# Patient Record
Sex: Female | Born: 1965 | Race: White | Hispanic: Yes | Marital: Single | Smoking: Never smoker
Health system: Southern US, Community
[De-identification: ages and names within clinical notes are randomized; demographics above are authoritative.]

## PROBLEM LIST (undated history)

## (undated) DIAGNOSIS — E785 Hyperlipidemia, unspecified: Secondary | ICD-10-CM

## (undated) DIAGNOSIS — I10 Essential (primary) hypertension: Secondary | ICD-10-CM

## (undated) HISTORY — PX: CHOLECYSTECTOMY: SHX55

## (undated) HISTORY — DX: Essential (primary) hypertension: I10

## (undated) HISTORY — DX: Hyperlipidemia, unspecified: E78.5

## (undated) HISTORY — PX: ABDOMINAL HYSTERECTOMY: SHX81

---

## 2007-04-27 ENCOUNTER — Ambulatory Visit: Payer: Self-pay | Admitting: Gastroenterology

## 2007-05-03 ENCOUNTER — Ambulatory Visit (HOSPITAL_COMMUNITY): Admission: RE | Admit: 2007-05-03 | Discharge: 2007-05-03 | Payer: Self-pay | Admitting: Gastroenterology

## 2007-05-05 ENCOUNTER — Encounter: Payer: Self-pay | Admitting: Gastroenterology

## 2007-05-05 ENCOUNTER — Ambulatory Visit: Payer: Self-pay | Admitting: Gastroenterology

## 2007-06-18 ENCOUNTER — Encounter (INDEPENDENT_AMBULATORY_CARE_PROVIDER_SITE_OTHER): Payer: Self-pay | Admitting: General Surgery

## 2007-06-18 ENCOUNTER — Ambulatory Visit (HOSPITAL_COMMUNITY): Admission: RE | Admit: 2007-06-18 | Discharge: 2007-06-18 | Payer: Self-pay | Admitting: General Surgery

## 2009-07-04 ENCOUNTER — Encounter: Admission: RE | Admit: 2009-07-04 | Discharge: 2009-07-04 | Payer: Self-pay | Admitting: Gastroenterology

## 2010-09-24 NOTE — Procedures (Signed)
Summary: Gastroenterology EGD  Gastroenterology EGD   Imported By: Lowry Ram CMA 11/23/2007 16:50:21  _____________________________________________________________________  External Attachment:    Type:   Image     Comment:   External Document

## 2011-01-07 NOTE — Op Note (Signed)
NAME:  Nicole Villanueva, Nicole Villanueva      ACCOUNT NO.:  [id number]   MEDICAL RECORD NO.:  [id number]          PATIENT TYPE:  AMB   LOCATION:  DAY                          FACILITY:  [facility]   PHYSICIAN:  Anselm Pancoast. Weatherly, M.D.DATE OF BIRTH:  [birth year]   DATE OF PROCEDURE:  06/18/2007  DATE OF DISCHARGE:  06/18/2007                               OPERATIVE REPORT   PREOPERATIVE DIAGNOSIS:  Chronic cholecystitis with stones.   POSTOPERATIVE DIAGNOSIS:  Chronic cholecystitis with stones.   OPERATION:  Laparoscopic cholecystectomy with cholangiogram.   SURGEON:  Anselm Pancoast. Zachery Dakins, M.D.   ASSISTANT:  Currie Paris, M.D.   ANESTHESIA:  General anesthesia.   HISTORY:  Nicole Villanueva is a 45 year old female who was referred to [place]  at the courtesy of Dr. Hal Hope after she had seen Dr. Sheryn Bison  and was identified to have multiple gallstones.  She is on Prilosec,  Crestor and Lotensin, plus metformin for her diabetes, which she takes  at bedtime.  She had an ultrasound, being evaluated for epigastric pain,  and this showed multiple gallstones and she was referred to [place].  Dr.  Nadyne Coombes, is her medical doctor and she is 5 feet 2 inches and  weighs about 160 pounds.  She had preoperatively had normal liver  function studies and had an upper endoscopy with Dr. Jarold Motto that  showed a little bit of Barrett's esophagitis with no ulcer in her  duodenum.   DESCRIPTION OF PROCEDURE:  The patient preoperatively was given 3 g of  Unasyn.  She had PAS stockings and positioned on the OR bed.  Induction  was with general anesthesia, endotracheal tube and oral tube into the  stomach and then the abdomen was prepped with Betadine surgical solution  and draped in a sterile manner.  A small incision was made at the  umbilicus down through approximately 3 inches of adipose tissue.  The  fascia was identified and a little small vertical opening made, 2  Kochers and then the underlying  posterior rectus fascia was identified,  opened and then we were in the free peritoneal cavity.  A pursestring  suture was placed and the Hasson cannula introduced and then the  gallbladder was kind of intrahepatic.  The upper 10-mm trocar was placed  and the 2 lateral 5-mm trocars were placed by Dr. Jamey Ripa.  The proximal  portion of the gallbladder was dissected, opening up the peritoneum and  there was a little structure that at first we thought that it was kind  of thin for the cystic duct, but we could not be sure and could not see  any pulses; it obviously went up on the gallbladder wall and we placed a  clip distally and made a little small opening.  There was not any actual  blood and we thought that it was a very small bile duct, but it was so  small that the catheter would not go in it and after 4 or 5 attempts to  try to get in it at different angles, etc, we clipped it triply and  divided it.  We then continued dissecting and identified what was  definitely the cystic duct, which was certainly of normal size, and we  could see the common bile duct and then we clipped it flush with the  gallbladder and then a little opening proximally, put in the [place]  catheter and then did an x-ray.  The junction is pretty close to the  bifurcations of the left and right bile ducts with good flow and then  good flow into the duodenum.  We then we removed the catheter, triply  clipped the cystic duct out distally and no way we could be compromising  the bifurcation area and then very carefully dissected it out, since I  think that the first little vessel structure was probably a blood vessel  and we probably freed up the posterior artery and then doubly clipped  and then divided it.  She had numerous stones within her gallbladder; it  was a long, kind of banana-shaped gallbladder and good hemostasis  obtained in dissecting it from its liver bed.  I placed it into an  EndoCatch bag, switched the  camera to the upper 10-mm port and withdrew  the gallbladder and sac, after opening the sac and just removing the  numerous big stones that would come through the little fascial defect  otherwise.  We then reinspected the bed; there was good hemostasis.  We  looked again where the clips had been placed and could see the little  structure that we first thought was the cystic duct and it was not  pulsating, but now it looks more like it was probably the cystic artery.  We irrigated and aspirated the irrigating fluid that we had placed and  then closed the fascial defect in the umbilicus with a couple of figure-  of-eights of 0 Vicryl in addition to the pursestring and then  anesthetized it with Marcaine with adrenaline.  The 5-mm ports were  withdrawn.  The subcutaneous wounds were closed with 4-0 Vicryl and  Benzoin and Steri-Strips.  The patient will decide whether she wants to  be discharged today or spend the night and go home in the morning and  she will have Vicodin for pain.           ______________________________  Anselm Pancoast. Zachery Dakins, M.D.     WJW/MEDQ  D:  06/18/2007  T:  06/20/2007  Job:  [id number]   cc:   Clydie Braun L. Hal Hope, M.D.  Fax: [phone]   Vania Rea. Jarold Motto, MD, FACG, FACP, FAGA  [address]  [place]  [place] [address]

## 2011-01-07 NOTE — Assessment & Plan Note (Signed)
[facility]                         GASTROENTEROLOGY OFFICE NOTE   NAME:[name], [name]               MRN:          [id number]  DATE:04/27/2007                            DOB:          31-Jan-1966    [name] is a 45 year old [place] female referred by Dr. Hal Hope  for evaluation of dysphagia and subxiphoid chest pain.   Most of the history on [name] is provided by her daughter because  of some language barrier problems.  Over the last 7-8 months, she has  had worsening reflux symptoms, burning substernal chest pain,  regurgitation, and also some progressive solid food dysphagia.  She has  been only partially relieved with daily Nexium and was found to have a  positive H. pylori antibody by Dr. Hal Hope and was treated with a  prevpac with initial relief of symptoms, but these have returned.  Her  daughter says she has mild anorexia and has lost 7-8 pounds.  She has  never had endoscopy, barium studies, or ultrasound exams.  She gives no  history of hepatobiliary complaints, history of gallbladder or known  liver disease.  She has mild constipation but denies melena or  hematochezia.  She follows a fairly regular diet.  She denies the abuse  of cigarettes, alcohol, or NSAIDs.   Her past medical history is remarkable for adult-onset diabetes over the  last 6-7 years along with essential hypertension.  She has some mild,  what sounds like probable proteinuria associated with her diabetes.  She  has a normal creatinine of 0.9 mg% with a hemoglobin A1C of 9.3% and a  normal CBC with a hemoglobin of 14.3.   MEDICATIONS:  1. Metformin 500 mg twice daily.  2. Nexium 40 mg a day.   She denies drug allergies.   FAMILY HISTORY:  Remarkable for diabetes in multiple family members.  There are no known gastrointestinal problems.   SOCIAL HISTORY:  Patient is single and lives with her son.  She has a  high school education.  She works as a  Solicitor.  She does not  smoke or abuse ethanol.   REVIEW OF SYSTEMS:  Noncontributory.   PHYSICAL EXAMINATION:  She is 5 feet 1 inches tall and weighs 159  pounds.  Blood pressure 122/80.  Pulse was 76 and regular.  I could not appreciate stigmata of chronic liver disease.  CHEST:  Clear.  She was in a regular rhythm without murmurs, rubs or gallops.  There was no hepatosplenomegaly, abdominal masses, or tenderness.  Bowel  sounds were normal.  Her upper extremities were unremarkable.  MENTAL STATUS:  Clear.   ASSESSMENT:  I think 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  Sometimes after H. pylori is  treated, acid reflux can become worse, in terms of symptomatology,  because of increased acid production.   RECOMMENDATIONS:  1. Check endoscopy and ultrasound exam.  2. Continue Nexium q.a.m. and twice daily if needed.  3. Placed her on Carafate suspension 1 gm after meals and at bedtime      until workup can be completed.  4. Continue other diabetic medications and follow up with Dr. Hal Hope      as previously planned.     Nicole Rea. Jarold Motto, MD, Caleen Essex, FAGA  Electronically Signed    DRP/MedQ  DD: 04/27/2007  DT: 04/27/2007  Job #: [id number]   cc:   Clydie Braun L. Hal Hope, M.D.

## 2011-06-04 LAB — COMPREHENSIVE METABOLIC PANEL
ALT: 32
AST: 30
Albumin: 3.6
Alkaline Phosphatase: 74
BUN: 6
CO2: 31
Calcium: 9.4
Chloride: 102
Creatinine, Ser: 0.42
GFR calc Af Amer: >60
GFR calc non Af Amer: >60
Glucose, Bld: 96
Potassium: 4.2
Sodium: 142
Total Bilirubin: 0.9
Total Protein: 7.7

## 2011-06-04 LAB — URINALYSIS, ROUTINE W REFLEX MICROSCOPIC
Bilirubin Urine: NEGATIVE
Glucose, UA: NEGATIVE
Hgb urine dipstick: NEGATIVE
Ketones, ur: NEGATIVE
Nitrite: NEGATIVE
Protein, ur: NEGATIVE
Specific Gravity, Urine: 1.016
Urobilinogen, UA: 1
pH: 6.5

## 2011-06-04 LAB — CBC
HCT: 41.4
Hemoglobin: 14.1
MCHC: 34
MCV: 89.1
Platelets: 429 — ABNORMAL HIGH
RBC: 4.65
RDW: 12.7
WBC: 8.9

## 2011-06-04 LAB — DIFFERENTIAL
Basophils Absolute: 0
Basophils Relative: 0
Eosinophils Absolute: 0
Eosinophils Relative: 1
Lymphocytes Relative: 32
Lymphs Abs: 2.8
Monocytes Absolute: 0.4
Monocytes Relative: 5
Neutro Abs: 5.6
Neutrophils Relative %: 63

## 2011-06-04 LAB — PREGNANCY, URINE: Preg Test, Ur: NEGATIVE

## 2011-11-25 ENCOUNTER — Ambulatory Visit (INDEPENDENT_AMBULATORY_CARE_PROVIDER_SITE_OTHER): Payer: 59 | Admitting: Family Medicine

## 2011-11-25 VITALS — BP 150/83 | HR 97 | Temp 98.6°F | Resp 16 | Ht 60.0 in | Wt 150.0 lb

## 2011-11-25 DIAGNOSIS — J019 Acute sinusitis, unspecified: Secondary | ICD-10-CM

## 2011-11-25 DIAGNOSIS — J329 Chronic sinusitis, unspecified: Secondary | ICD-10-CM

## 2011-11-25 DIAGNOSIS — J40 Bronchitis, not specified as acute or chronic: Secondary | ICD-10-CM

## 2011-11-25 DIAGNOSIS — J209 Acute bronchitis, unspecified: Secondary | ICD-10-CM

## 2011-11-25 DIAGNOSIS — E119 Type 2 diabetes mellitus without complications: Secondary | ICD-10-CM

## 2011-11-25 MED ORDER — AMOXICILLIN-POT CLAVULANATE 875-125 MG PO TABS: 1.0000 | ORAL_TABLET | Freq: Two times a day (BID) | ORAL | Status: AC

## 2011-11-25 MED ORDER — METFORMIN HCL 1000 MG PO TABS: 1000.0000 mg | ORAL_TABLET | Freq: Two times a day (BID) | ORAL | Status: DC

## 2011-11-25 MED ORDER — HYDROCODONE-HOMATROPINE 5-1.5 MG/5ML PO SYRP: 5.0000 mL | ORAL_SOLUTION | Freq: Three times a day (TID) | ORAL | Status: AC | PRN

## 2011-11-25 MED ORDER — INSULIN GLARGINE 100 UNIT/ML ~~LOC~~ SOLN: 12.0000 [IU] | Freq: Every day | SUBCUTANEOUS | Status: DC

## 2011-11-25 MED ORDER — LISINOPRIL 10 MG PO TABS: 10.0000 mg | ORAL_TABLET | Freq: Every day | ORAL | Status: DC

## 2011-11-25 NOTE — Patient Instructions (Signed)
Sinusitis (Sinusitis) Los senos paranasales son bolsas de aire que se encuentran dentro de los huesos de la cara. La aparicin de bacterias en los senos paranasales lleva a una infeccin. Esta se denomina sinusitis.Estas infecciones generalmente son el resultado de una obstruccin en los orificios que [employer] senos.  SNTOMAS Generalmente, segn que seno se infecte, habr diferentes reas en las que Special educational needs teacher.   Los senos maxilares generalmente producen dolor detrs de los ojos.   La sinusitis frontal ocasiona dolor en el medio de la frente y [place] ojos.  Entre otros problemas (sntomas) se incluyen:  Dolor en la zona posterior de los dientes superiores.   Una secrecin similar al pus (purulenta) proveniente de la nariz.   Toda inflamacin, calor o sensibilidad en estas mismas reas son indicios de infeccin.  TRATAMIENTO La sinusitis se diagnostica a travs del examen fsico y radiografas. Si le han tomado radiografas, asegrese de [employer]. O consulte el modo en que podr obtenerlos. Su mdico le prescribir medicamentos (antibiticos). Estos medicamentos se indican para combatir la infeccin. Tambin le prescribir un descongestivo para reducir la inflamacin del seno paranasal.  INSTRUCCIONES PARA EL CUIDADO DOMICILIARIO  Utilice los medicamentos de venta libre o de prescripcin para Chief Technology Officer, el malestar o la [place], segn se lo indique el profesional que lo asiste.   Beba gran cantidad de lquidos. Los lquidos ayudan a que las mucosas de los senos nasales drenen ms fcilmente.   Aplique bolsas de calor hmedo o hielo en las zonas ms doloridas para [employer].   Utilice Sprays nasales salinos para ayudar a humedecer los senos nasales. Estos pueden encontrarse en la farmacia local.  SOLICITE ATENCIN MDICA DE INMEDIATO SI:  Lance Muss.   Dolor en aumento, dolor de cabeza intenso o dolor de dientes.   Nuseas, vmitos o  sudoracin.   Hinchazn infrecuente alrededor del rostro o problemas en la visin.  EST SEGURO QUE:   Comprende las instrucciones para el alta mdica.   Controlar su enfermedad.   Solicitar atencin mdica de inmediato segn las indicaciones.  Document Released: 05/21/2005 Document Revised: 07/31/2011 [facility] Patient Information 2012 [place], [place].Bronchitis Bronchitis is the body's way of reacting to injury and/or infection (inflammation) of the bronchi. Bronchi are the air tubes that extend from the windpipe into the lungs. If the inflammation becomes severe, it may cause shortness of breath. CAUSES  Inflammation may be caused by:  A virus.   Germs (bacteria).   Dust.   Allergens.   Pollutants and many other irritants.  The cells lining the bronchial tree are covered with tiny hairs (cilia). These constantly beat upward, away from the lungs, toward the mouth. This keeps the lungs free of pollutants. When these cells become too irritated and are unable to do their job, mucus begins to develop. This causes the characteristic cough of bronchitis. The cough clears the lungs when the cilia are unable to do their job. Without either of these protective mechanisms, the mucus would settle in the lungs. Then you would develop pneumonia. Smoking is a common cause of bronchitis and can contribute to pneumonia. Stopping this habit is the single most important thing you can do to help yourself. TREATMENT   Your caregiver may prescribe an antibiotic if the cough is caused by bacteria. Also, medicines that open up your airways make it easier to breathe. Your caregiver may also recommend or prescribe an expectorant. It will loosen the mucus to be coughed up. Only take  over-the-counter or prescription medicines for pain, discomfort, or fever as directed by your caregiver.   Removing whatever causes the problem (smoking, for example) is critical to preventing the problem from getting worse.    Cough suppressants may be prescribed for relief of cough symptoms.   Inhaled medicines may be prescribed to help with symptoms now and to help prevent problems from returning.   For those with recurrent (chronic) bronchitis, there may be a need for steroid medicines.  SEEK IMMEDIATE MEDICAL CARE IF:   During treatment, you develop more pus-like mucus (purulent sputum).   You have a fever.   Your baby is older than 3 months with a rectal temperature of 102 F (38.9 C) or higher.   Your baby is 45 months old or younger with a rectal temperature of 100.4 F (38 C) or higher.   You become progressively more ill.   You have increased difficulty breathing, wheezing, or shortness of breath.  It is necessary to seek immediate medical care if you are elderly or sick from any other disease. MAKE SURE YOU:   Understand these instructions.   Will watch your condition.   Will get help right away if you are not doing well or get worse.  Document Released: 08/11/2005 Document Revised: 07/31/2011 Document Reviewed: 06/20/2008 [facility] Patient Information 2012 [place], [place].

## 2011-11-25 NOTE — Progress Notes (Signed)
46 yo diabetic woman with 4 days of mildly productive cough associated with sinus congestion.  She has h/o pneumonia.  O:  NAD HEENT:  Mucopurulent d/c nose, o/w neg Chest:  ronchi Heart: reg, no murmur  A:  Sinusitis, bronchitis, acute with underlying diabetes  P:  augmentin bid and hydromet   Diabetes issues were reviewed with patient including compliance, diet, exercise. Also, all meds refilled

## 2012-03-10 ENCOUNTER — Ambulatory Visit: Payer: 59

## 2012-03-10 ENCOUNTER — Ambulatory Visit (INDEPENDENT_AMBULATORY_CARE_PROVIDER_SITE_OTHER): Payer: 59 | Admitting: Family Medicine

## 2012-03-10 VITALS — BP 98/60 | HR 67 | Temp 98.1°F | Resp 16 | Ht 59.5 in | Wt 147.0 lb

## 2012-03-10 DIAGNOSIS — N390 Urinary tract infection, site not specified: Secondary | ICD-10-CM

## 2012-03-10 DIAGNOSIS — M25529 Pain in unspecified elbow: Secondary | ICD-10-CM

## 2012-03-10 DIAGNOSIS — E119 Type 2 diabetes mellitus without complications: Secondary | ICD-10-CM

## 2012-03-10 DIAGNOSIS — I10 Essential (primary) hypertension: Secondary | ICD-10-CM

## 2012-03-10 DIAGNOSIS — Z0000 Encounter for general adult medical examination without abnormal findings: Secondary | ICD-10-CM

## 2012-03-10 DIAGNOSIS — N898 Other specified noninflammatory disorders of vagina: Secondary | ICD-10-CM

## 2012-03-10 LAB — TSH: TSH: 0.733 u[IU]/mL (ref 0.350–4.500)

## 2012-03-10 LAB — POCT URINALYSIS DIPSTICK
Bilirubin, UA: NEGATIVE
Blood, UA: NEGATIVE
Glucose, UA: NEGATIVE
Ketones, UA: NEGATIVE
Nitrite, UA: NEGATIVE
Protein, UA: NEGATIVE
Spec Grav, UA: 1.01
Urobilinogen, UA: 0.2
pH, UA: 6

## 2012-03-10 LAB — COMPREHENSIVE METABOLIC PANEL
ALT: 19 U/L (ref 0–35)
AST: 18 U/L (ref 0–37)
Albumin: 4.5 g/dL (ref 3.5–5.2)
Alkaline Phosphatase: 93 U/L (ref 39–117)
BUN: 9 mg/dL (ref 6–23)
CO2: 31 mEq/L (ref 19–32)
Calcium: 9.7 mg/dL (ref 8.4–10.5)
Chloride: 98 mEq/L (ref 96–112)
Creat: 0.48 mg/dL — ABNORMAL LOW (ref 0.50–1.10)
Glucose, Bld: 133 mg/dL — ABNORMAL HIGH (ref 70–99)
Potassium: 4.3 mEq/L (ref 3.5–5.3)
Sodium: 136 mEq/L (ref 135–145)
Total Bilirubin: 0.7 mg/dL (ref 0.3–1.2)
Total Protein: 8 g/dL (ref 6.0–8.3)

## 2012-03-10 LAB — LIPID PANEL
Cholesterol: 273 mg/dL — ABNORMAL HIGH (ref 0–200)
HDL: 42 mg/dL (ref >39)
LDL Cholesterol: 179 mg/dL — ABNORMAL HIGH (ref 0–99)
Total CHOL/HDL Ratio: 6.5 Ratio
Triglycerides: 261 mg/dL — ABNORMAL HIGH (ref <150)
VLDL: 52 mg/dL — ABNORMAL HIGH (ref 0–40)

## 2012-03-10 LAB — POCT CBC
Granulocyte percent: 53.5 %G (ref 37–80)
HCT, POC: 47.1 % (ref 37.7–47.9)
Hemoglobin: 14.7 g/dL (ref 12.2–16.2)
Lymph, poc: 5.8 — AB (ref 0.6–3.4)
MCH, POC: 28.9 pg (ref 27–31.2)
MCHC: 31.2 g/dL — AB (ref 31.8–35.4)
MCV: 92.7 fL (ref 80–97)
MID (cbc): 1 — AB (ref 0–0.9)
MPV: 8.7 fL (ref 0–99.8)
POC Granulocyte: 7.9 — AB (ref 2–6.9)
POC LYMPH PERCENT: 39.4 %L (ref 10–50)
POC MID %: 7.1 %M (ref 0–12)
Platelet Count, POC: 145 10*3/uL (ref 142–424)
RBC: 5.08 M/uL (ref 4.04–5.48)
RDW, POC: 13.3 %
WBC: 14.7 10*3/uL — AB (ref 4.6–10.2)

## 2012-03-10 LAB — POCT UA - MICROSCOPIC ONLY
Bacteria, U Microscopic: NEGATIVE
Casts, Ur, LPF, POC: NEGATIVE
Crystals, Ur, HPF, POC: NEGATIVE
Mucus, UA: NEGATIVE
RBC, urine, microscopic: NEGATIVE
Yeast, UA: NEGATIVE

## 2012-03-10 LAB — POCT GLYCOSYLATED HEMOGLOBIN (HGB A1C): Hemoglobin A1C: 8.6

## 2012-03-10 LAB — POCT WET PREP WITH KOH
Clue Cells Wet Prep HPF POC: NEGATIVE
KOH Prep POC: NEGATIVE
RBC Wet Prep HPF POC: NEGATIVE
Trichomonas, UA: NEGATIVE
WBC Wet Prep HPF POC: NEGATIVE
Yeast Wet Prep HPF POC: NEGATIVE

## 2012-03-10 MED ORDER — CIPROFLOXACIN HCL 250 MG PO TABS
250.0000 mg | ORAL_TABLET | Freq: Two times a day (BID) | ORAL | Status: AC
Start: 2012-03-10 — End: 2012-03-20

## 2012-03-10 MED ORDER — MELOXICAM 15 MG PO TABS: 15.0000 mg | ORAL_TABLET | Freq: Every day | ORAL | Status: AC

## 2012-03-10 NOTE — Progress Notes (Signed)
[facility]:  Office Visit  Chief Complaint:  Chief Complaint  Patient presents with  . Annual Exam    need fasting lab; pap smear    HPI: Nicole Villanueva is a 46 y.o. female who is here  for PE with pap Never had any abnormal paps, no prior mammogram; Would like to get pap even though normal paps in past Did not have period while on HCG for weight loss, has had period since then after ~ 3 years ammenorhea, restarted menses in March, LMP in April. + hot flashes.  Mother was premenopausal at 21. She also complains of Right elbow pain, described as sharp, intermittent, worse with palpation and movement. Started  after hitting it against object x 3 months. + swelling. She works as Production designer, theatre/television/film in [employer], has problems lifting occasionally  due to pain. Tries to avoid lifting as much as possible but that is hard to do all the time considering her job description.    Past Medical History  Diagnosis Date  . Hypertension   . Diabetes mellitus    Past Surgical History  Procedure Date  . Cholecystectomy    History   Social History  . Marital Status: Single    Spouse Name: N/A    Number of Children: N/A  . Years of Education: N/A   Social History Main Topics  . Smoking status: Never Smoker   . Smokeless tobacco: None  . Alcohol Use: No  . Drug Use: No  . Sexually Active: None   Other Topics Concern  . None   Social History Narrative  . None   Family History  Problem Relation Age of Onset  . Diabetes Mother   . Diabetes Father    No Known Allergies Prior to Admission medications   Medication Sig Start Date End Date Taking? Authorizing Provider  insulin glargine (LANTUS) 100 UNIT/ML injection Inject 12 Units into the skin at bedtime. 11/25/11  Yes Elvina Sidle, MD  lisinopril (PRINIVIL,ZESTRIL) 10 MG tablet Take 1 tablet (10 mg total) by mouth daily. 11/25/11  Yes Elvina Sidle, MD  metFORMIN (GLUCOPHAGE) 1000 MG tablet Take 1 tablet (1,000 mg  total) by mouth 2 (two) times daily with a meal. 11/25/11  Yes Elvina Sidle, MD     ROS: The patient denies fevers, chills, night sweats, unintentional weight loss, chest pain, palpitations, wheezing, dyspnea on exertion, nausea, vomiting, abdominal pain, dysuria, hematuria, melena, numbness, weakness, or tingling. + vaginal dc  All other systems have been reviewed and were otherwise negative with the exception of those mentioned in the HPI and as above.    PHYSICAL EXAM: Filed Vitals:   03/10/12 0923  BP: 98/60  Pulse: 67  Temp: 98.1 F (36.7 C)  Resp: 16   Filed Vitals:   03/10/12 0923  Height: 4' 11.5" (1.511 m)  Weight: 147 lb (66.679 kg)   Body mass index is 29.19 kg/(m^2).  General: Alert, no acute distress HEENT:  Normocephalic, atraumatic, oropharynx patent.  Cardiovascular:  Regular rate and rhythm, no rubs murmurs or gallops.  No Carotid bruits, radial pulse intact. No pedal edema.  Respiratory: Clear to auscultation bilaterally.  No wheezes, rales, or rhonchi.  No cyanosis, no use of accessory musculature GI: No organomegaly, abdomen is soft and non-tender, positive bowel sounds.  No masses. Skin: No rashes. Neurologic: Facial musculature symmetric. Psychiatric: Patient is appropriate throughout our interaction. Lymphatic: No cervical lymphadenopathy Musculoskeletal: Gait intact. GU: no masses, lesions, rashes;  vaginal vault nl;  no CMT, no purulent or white dc Breast: nl exam, no masses, skin changes, dc Right elbow:  + lateral elbow/epicondyle  tenderness, swelling ; ROM intact, senstation intact. Radial pulse intact.    LABS: Results for orders placed in visit on 03/10/12  POCT CBC      Component Value Range   WBC 14.7 (*) 4.6 - 10.2 K/uL   Lymph, poc 5.8 (*) 0.6 - 3.4   POC LYMPH PERCENT 39.4  10 - 50 %L   MID (cbc) 1.0 (*) 0 - 0.9   POC MID % 7.1  0 - 12 %M   POC Granulocyte 7.9 (*) 2 - 6.9   Granulocyte percent 53.5  37 - 80 %G   RBC 5.08  4.04 -  5.48 M/uL   Hemoglobin 14.7  12.2 - 16.2 g/dL   HCT, POC 40.9  81.1 - 47.9 %   MCV 92.7  80 - 97 fL   MCH, POC 28.9  27 - 31.2 pg   MCHC 31.2 (*) 31.8 - 35.4 g/dL   RDW, POC 91.4     Platelet Count, POC 145  142 - 424 K/uL   MPV 8.7  0 - 99.8 fL  POCT GLYCOSYLATED HEMOGLOBIN (HGB A1C)      Component Value Range   Hemoglobin A1C 8.6    POCT UA - MICROSCOPIC ONLY      Component Value Range   WBC, Ur, HPF, POC 0-1     RBC, urine, microscopic negative     Bacteria, U Microscopic negative     Mucus, UA negative     Epithelial cells, urine per micros 0-1     Crystals, Ur, HPF, POC negative     Casts, Ur, LPF, POC negative     Yeast, UA negative    POCT WET PREP WITH KOH      Component Value Range   Trichomonas, UA Negative     Clue Cells Wet Prep HPF POC negative     Epithelial Wet Prep HPF POC 0-4     Yeast Wet Prep HPF POC negative     Bacteria Wet Prep HPF POC trace     RBC Wet Prep HPF POC negative     WBC Wet Prep HPF POC negative     KOH Prep POC Negative    POCT URINALYSIS DIPSTICK      Component Value Range   Color, UA yellow     Clarity, UA clear     Glucose, UA negative     Bilirubin, UA negative     Ketones, UA negative     Spec Grav, UA 1.010     Blood, UA negative     pH, UA 6.0     Protein, UA negative     Urobilinogen, UA 0.2     Nitrite, UA negative     Leukocytes, UA Trace       EKG/XRAY:   Primary read interpreted by Dr. Conley Rolls at [facility]. Elbow: no acute fracture/dislocation   ASSESSMENT/PLAN: Encounter Diagnoses  Name Primary?  . Annual physical exam Yes  . HTN (hypertension)   . Diabetes mellitus   . Vaginal Discharge   . Elbow pain    Patient is here for CPE  With pap ( she insists on having a pap today even though we discussed that the new ACOG guidelines do not require yearly paps in someone her age with previous normal paps, however a pelvic exam is needed yearly. Since we are doing  pelvic patient would also like a pap smear)  She also has  other medical complaints on this visit. I spent a total of 45 minutes with her.   1. Labs penidng, d/w pt labs in house. Will rx Cipro 250 mg BID x 3 days for possible UTI. Urine cx pending. 2. Gave instructions on better diabetes control 3. [facility] 4. Refilled chronic medications 5. Mobic for lateral epicondylitis/elbow pain  F/u in 4 weeks for elbow pain if no improvement on Mobic F/u in 3 months for diabetes F/u in 1 year for annual    LE, THAO PHUONG, DO 03/10/2012 12:15 PM   =

## 2012-03-12 DIAGNOSIS — I10 Essential (primary) hypertension: Secondary | ICD-10-CM | POA: Insufficient documentation

## 2012-03-12 DIAGNOSIS — E1159 Type 2 diabetes mellitus with other circulatory complications: Secondary | ICD-10-CM | POA: Insufficient documentation

## 2012-03-12 DIAGNOSIS — I152 Hypertension secondary to endocrine disorders: Secondary | ICD-10-CM | POA: Insufficient documentation

## 2012-03-12 LAB — URINE CULTURE: Colony Count: 30000

## 2012-03-12 LAB — PAP IG AND CT-NG NAA
Chlamydia Probe Amp: NEGATIVE
GC Probe Amp: NEGATIVE

## 2012-03-14 ENCOUNTER — Other Ambulatory Visit: Payer: Self-pay | Admitting: Family Medicine

## 2012-03-14 ENCOUNTER — Encounter: Payer: Self-pay | Admitting: Family Medicine

## 2012-03-14 ENCOUNTER — Telehealth: Payer: Self-pay | Admitting: Family Medicine

## 2012-03-14 DIAGNOSIS — E785 Hyperlipidemia, unspecified: Secondary | ICD-10-CM

## 2012-03-14 MED ORDER — PRAVASTATIN SODIUM 40 MG PO TABS: 40.0000 mg | ORAL_TABLET | Freq: Every day | ORAL | Status: DC

## 2012-03-14 NOTE — Telephone Encounter (Signed)
Spoke to patient about lab results. Patient will take statin meds for elevated cholesterol.

## 2013-02-15 ENCOUNTER — Other Ambulatory Visit: Payer: Self-pay | Admitting: Family Medicine

## 2013-03-30 ENCOUNTER — Ambulatory Visit (INDEPENDENT_AMBULATORY_CARE_PROVIDER_SITE_OTHER): Payer: 59 | Admitting: Emergency Medicine

## 2013-03-30 ENCOUNTER — Ambulatory Visit: Payer: 59

## 2013-03-30 VITALS — BP 118/72 | HR 67 | Temp 97.7°F | Resp 18 | Ht 60.0 in | Wt 151.0 lb

## 2013-03-30 DIAGNOSIS — M79645 Pain in left finger(s): Secondary | ICD-10-CM

## 2013-03-30 DIAGNOSIS — E119 Type 2 diabetes mellitus without complications: Secondary | ICD-10-CM

## 2013-03-30 DIAGNOSIS — E785 Hyperlipidemia, unspecified: Secondary | ICD-10-CM

## 2013-03-30 DIAGNOSIS — M79609 Pain in unspecified limb: Secondary | ICD-10-CM

## 2013-03-30 DIAGNOSIS — Z0000 Encounter for general adult medical examination without abnormal findings: Secondary | ICD-10-CM

## 2013-03-30 DIAGNOSIS — R229 Localized swelling, mass and lump, unspecified: Secondary | ICD-10-CM

## 2013-03-30 DIAGNOSIS — R109 Unspecified abdominal pain: Secondary | ICD-10-CM

## 2013-03-30 DIAGNOSIS — R2232 Localized swelling, mass and lump, left upper limb: Secondary | ICD-10-CM

## 2013-03-30 LAB — LIPID PANEL
Cholesterol: 242 mg/dL — ABNORMAL HIGH (ref 0–200)
HDL: 38 mg/dL — ABNORMAL LOW (ref >39)
LDL Cholesterol: 135 mg/dL — ABNORMAL HIGH (ref 0–99)
Total CHOL/HDL Ratio: 6.4 Ratio
Triglycerides: 346 mg/dL — ABNORMAL HIGH (ref <150)
VLDL: 69 mg/dL — ABNORMAL HIGH (ref 0–40)

## 2013-03-30 LAB — COMPREHENSIVE METABOLIC PANEL
ALT: 43 U/L — ABNORMAL HIGH (ref 0–35)
AST: 35 U/L (ref 0–37)
Albumin: 4.2 g/dL (ref 3.5–5.2)
Alkaline Phosphatase: 83 U/L (ref 39–117)
BUN: 13 mg/dL (ref 6–23)
CO2: 27 mEq/L (ref 19–32)
Calcium: 9.5 mg/dL (ref 8.4–10.5)
Chloride: 101 mEq/L (ref 96–112)
Creat: 0.45 mg/dL — ABNORMAL LOW (ref 0.50–1.10)
Glucose, Bld: 204 mg/dL — ABNORMAL HIGH (ref 70–99)
Potassium: 4.1 mEq/L (ref 3.5–5.3)
Sodium: 135 mEq/L (ref 135–145)
Total Bilirubin: 0.6 mg/dL (ref 0.3–1.2)
Total Protein: 7.6 g/dL (ref 6.0–8.3)

## 2013-03-30 LAB — POCT CBC
Granulocyte percent: 55.3 %G (ref 37–80)
HCT, POC: 44 % (ref 37.7–47.9)
Hemoglobin: 14.1 g/dL (ref 12.2–16.2)
Lymph, poc: 3.2 (ref 0.6–3.4)
MCH, POC: 29.9 pg (ref 27–31.2)
MCHC: 32 g/dL (ref 31.8–35.4)
MCV: 93.5 fL (ref 80–97)
MID (cbc): 0.5 (ref 0–0.9)
MPV: 8.1 fL (ref 0–99.8)
POC Granulocyte: 4.5 (ref 2–6.9)
POC LYMPH PERCENT: 38.9 %L (ref 10–50)
POC MID %: 5.8 %M (ref 0–12)
Platelet Count, POC: 369 10*3/uL (ref 142–424)
RBC: 4.71 M/uL (ref 4.04–5.48)
RDW, POC: 13.2 %
WBC: 8.2 10*3/uL (ref 4.6–10.2)

## 2013-03-30 LAB — POCT URINALYSIS DIPSTICK
Bilirubin, UA: NEGATIVE
Blood, UA: NEGATIVE
Glucose, UA: 250
Ketones, UA: NEGATIVE
Leukocytes, UA: NEGATIVE
Nitrite, UA: NEGATIVE
Protein, UA: NEGATIVE
Spec Grav, UA: 1.025
Urobilinogen, UA: 0.2
pH, UA: 5.5

## 2013-03-30 LAB — TSH: TSH: 0.79 u[IU]/mL (ref 0.350–4.500)

## 2013-03-30 LAB — GLUCOSE, POCT (MANUAL RESULT ENTRY): POC Glucose: 188 mg/dl — AB (ref 70–99)

## 2013-03-30 LAB — POCT GLYCOSYLATED HEMOGLOBIN (HGB A1C): Hemoglobin A1C: 10.1

## 2013-03-30 MED ORDER — PRAVASTATIN SODIUM 40 MG PO TABS: 40.0000 mg | ORAL_TABLET | Freq: Every day | ORAL | Status: DC

## 2013-03-30 MED ORDER — LISINOPRIL 10 MG PO TABS: 10.0000 mg | ORAL_TABLET | Freq: Every day | ORAL | Status: DC

## 2013-03-30 MED ORDER — INSULIN GLARGINE 100 UNIT/ML ~~LOC~~ SOLN: 12.0000 [IU] | Freq: Every day | SUBCUTANEOUS | Status: DC

## 2013-03-30 MED ORDER — METFORMIN HCL 1000 MG PO TABS: 1000.0000 mg | ORAL_TABLET | Freq: Two times a day (BID) | ORAL | Status: DC

## 2013-03-30 NOTE — Progress Notes (Signed)
Subjective:    Patient ID: Nicole Villanueva, female    DOB: 06-03-1966, 47 y.o.   MRN: [id number]  HPI 47 year old female presents for CPE.  She is doing well. Does have a history of IDDM and hyperlipidemia. Normally sees Dr. Milus Glazier. Had a physical 1 year ago but has not had any follow up since then.  Was referred for a screening mammogram last year but did not go. Has never had a mammogram. Also was sent to GI for evaluation of abdominal pain - also did not go to that appointment but is wanting another referral for both. Has hx of constipation and intermittent abdominal pains as well as acid reflux.    Complains of a bump that she has noticed on her left index finger x 2 months. Admits she thinks she got a splinter in the area that she thinks did not come out.  Since then she has had a mass on the medial aspect of her finger that has grown in size and become bothersome. Will bleed on occasion if struck against something.    Had normal pap test last year. Denies vaginal discharge, abdominal pain, dysuria, or urinary frequency.   Medications unchanged - continues to use Lantus 12 units at bedtime, Metformin twice daily, pravachol and lisinopril daily. She does admit that she does not take her medications regularly and will miss occasional doses.     Review of Systems  Constitutional: Negative.   HENT: Negative.   Eyes: Negative.   Respiratory: Negative.   Cardiovascular: Negative.   Gastrointestinal: Negative.   Endocrine: Negative.   Genitourinary: Negative.   Skin: Positive for color change.  Allergic/Immunologic: Negative.   Neurological: Negative.   Hematological: Negative.   Psychiatric/Behavioral: Negative.        Objective:   Physical Exam  Constitutional: She is oriented to person, place, and time. She appears well-developed and well-nourished.  HENT:  Head: Normocephalic and atraumatic.  Right Ear: External ear normal.  Left Ear: External ear normal.   Mouth/Throat: Oropharynx is clear and moist.  Eyes: Conjunctivae and EOM are normal. Pupils are equal, round, and reactive to light.  Neck: Normal range of motion. Neck supple. No thyromegaly present.  Cardiovascular: Normal rate, regular rhythm and normal heart sounds.   Pulmonary/Chest: Effort normal and breath sounds normal.  Abdominal: Soft. Bowel sounds are normal. There is no rebound and no guarding.  Lymphadenopathy:    She has no cervical adenopathy.  Neurological: She is alert and oriented to person, place, and time.  Psychiatric: She has a normal mood and affect. Her behavior is normal. Judgment and thought content normal.   Results for orders placed in visit on 03/30/13  POCT CBC      Result Value Range   WBC 8.2  4.6 - 10.2 K/uL   Lymph, poc 3.2  0.6 - 3.4   POC LYMPH PERCENT 38.9  10 - 50 %L   MID (cbc) 0.5  0 - 0.9   POC MID % 5.8  0 - 12 %M   POC Granulocyte 4.5  2 - 6.9   Granulocyte percent 55.3  37 - 80 %G   RBC 4.71  4.04 - 5.48 M/uL   Hemoglobin 14.1  12.2 - 16.2 g/dL   HCT, POC 84.6  96.2 - 47.9 %   MCV 93.5  80 - 97 fL   MCH, POC 29.9  27 - 31.2 pg   MCHC 32.0  31.8 - 35.4 g/dL  RDW, POC 13.2     Platelet Count, POC 369  142 - 424 K/uL   MPV 8.1  0 - 99.8 fL  POCT URINALYSIS DIPSTICK      Result Value Range   Color, UA yellow     Clarity, UA cloudy     Glucose, UA 250     Bilirubin, UA neg     Ketones, UA neg     Spec Grav, UA 1.025     Blood, UA neg     pH, UA 5.5     Protein, UA neg     Urobilinogen, UA 0.2     Nitrite, UA neg     Leukocytes, UA Negative    GLUCOSE, POCT (MANUAL RESULT ENTRY)      Result Value Range   POC Glucose 188 (*) 70 - 99 mg/dl  POCT GLYCOSYLATED HEMOGLOBIN (HGB A1C)      Result Value Range   Hemoglobin A1C 10.1           [facility] reading (PRIMARY) by  Dr. Cleta Alberts as no acute bony abnormality or FB noted.  Soft tissue swelling of left side of index finger.    Assessment & Plan:  1. Routine general medical  examination at a health care facility - Plan: MM Digital Screening, POCT CBC, POCT urinalysis dipstick, Comprehensive metabolic panel, POCT glucose (manual entry), POCT glycosylated hemoglobin (Hb A1C), Lipid panel, TSH, CANCELED: POCT UA - Microscopic Only  -Labs pending.  2. Abdominal  pain, other specified site - Plan: Ambulatory referral to Gastroenterology  -Referral placed to GI for further evaluation and treatment 3. Finger pain, left - Plan: DG Finger Index Left 4. Diabetes mellitus - Plan: metFORMIN (GLUCOPHAGE) 1000 MG tablet, lisinopril (PRINIVIL,ZESTRIL) 10 MG tablet, insulin glargine (LANTUS) 100 UNIT/ML injection  -Refilled all chronic meds  -Follow up in 3 months with Dr. Milus Glazier for recheck of diabetes.  5. Other and unspecified hyperlipidemia  -Pending labs 6. Mass on other site of finger, left - Plan: Ambulatory referral to Orthopedic Surgery  -Referral placed to Orthopedics for further evaluation and removal. ?etiology - pyogenic granuloma vs skin cancer

## 2013-04-12 ENCOUNTER — Ambulatory Visit
Admission: RE | Admit: 2013-04-12 | Discharge: 2013-04-12 | Disposition: A | Discharge status: Home / Self Care | Payer: 59 | Source: Ambulatory Visit | Attending: Physician Assistant | Admitting: Physician Assistant

## 2013-04-12 DIAGNOSIS — Z0000 Encounter for general adult medical examination without abnormal findings: Secondary | ICD-10-CM

## 2013-04-29 ENCOUNTER — Telehealth: Payer: Self-pay | Admitting: Radiology

## 2013-04-29 NOTE — Telephone Encounter (Signed)
Patients insurance requires electronic referral form [place], through her insurance company for her surgery for her finger for a mass removal with [facility]. Call back [phone], ext [phone], direct # [id number] Darl Pikes. Please advise do you know how to do this, it is a [employer]IG[employer] product with her [facility]

## 2013-05-02 ENCOUNTER — Telehealth: Payer: Self-pay | Admitting: Radiology

## 2013-05-02 NOTE — Telephone Encounter (Signed)
Have spoken to [facility], unfortunately it does not appear we have been able to get authorization/ referral done through [facility]. Have advised Darl Pikes at [facility], there is no one working in referrals who can help her, Lupita Leash is out of the office. Patients surgery will have to be rescheduled.

## 2013-05-19 ENCOUNTER — Encounter: Payer: Self-pay | Admitting: Physician Assistant

## 2013-08-08 ENCOUNTER — Telehealth: Payer: Self-pay

## 2013-08-08 NOTE — Telephone Encounter (Signed)
Pt stopped by [phone] to ask about ortho referral. I checked with referrals and Tonna Corner said the day we tried to pre-cert the computer was down and we did a manual pre-cert.  Pt's ins is stating that a referral was never done. Tonna Corner is going to leave a message on Colleens's desk to ask what needs to be done.  Charlette Caffey

## 2013-12-21 ENCOUNTER — Ambulatory Visit (INDEPENDENT_AMBULATORY_CARE_PROVIDER_SITE_OTHER): Payer: 59 | Admitting: Family Medicine

## 2013-12-21 VITALS — BP 100/60 | HR 80 | Temp 97.9°F | Resp 16 | Ht 60.0 in | Wt 152.0 lb

## 2013-12-21 DIAGNOSIS — E1165 Type 2 diabetes mellitus with hyperglycemia: Secondary | ICD-10-CM

## 2013-12-21 DIAGNOSIS — R002 Palpitations: Secondary | ICD-10-CM

## 2013-12-21 DIAGNOSIS — IMO0001 Reserved for inherently not codable concepts without codable children: Secondary | ICD-10-CM

## 2013-12-21 DIAGNOSIS — R42 Dizziness and giddiness: Secondary | ICD-10-CM

## 2013-12-21 LAB — POCT CBC
Granulocyte percent: 79.6 %G (ref 37–80)
HCT, POC: 39 % (ref 37.7–47.9)
Hemoglobin: 12.6 g/dL (ref 12.2–16.2)
Lymph, poc: 1.9 (ref 0.6–3.4)
MCH, POC: 29.4 pg (ref 27–31.2)
MCHC: 32.3 g/dL (ref 31.8–35.4)
MCV: 90.9 fL (ref 80–97)
MID (cbc): 0.4 (ref 0–0.9)
MPV: 8.3 fL (ref 0–99.8)
POC Granulocyte: 9 — AB (ref 2–6.9)
POC LYMPH PERCENT: 17 %L (ref 10–50)
POC MID %: 3.4 %M (ref 0–12)
Platelet Count, POC: 375 10*3/uL (ref 142–424)
RBC: 4.29 M/uL (ref 4.04–5.48)
RDW, POC: 12.8 %
WBC: 11.3 10*3/uL — AB (ref 4.6–10.2)

## 2013-12-21 LAB — GLUCOSE, POCT (MANUAL RESULT ENTRY)

## 2013-12-21 MED ORDER — LANCET DEVICE MISC: Status: DC

## 2013-12-21 MED ORDER — GLUCOSE BLOOD VI STRP: ORAL_STRIP | Status: DC

## 2013-12-21 NOTE — Patient Instructions (Addendum)
Thank you for coming in today  Increase lantus to 17 units tonight Call tomorrow to schedule appointment with Dr. Conley RollsLe or Dr. Georgia LopesLaurenstein within 1 week Check blood sugar 3 times per day and write down in log book. Bring this with you to your next appointment Drink plenty of water. You will urinate more when your sugar is high  Your EKG is normal Labs pending Refer to cardiology for further evaluation to rule out heart as cause of symptoms Discontinue lisinopril for now Go to ER or Urgent care if symptoms return  Near-Syncope Near-syncope (commonly known as near fainting) is sudden weakness, dizziness, or feeling like you might pass out. During an episode of near-syncope, you may also develop pale skin, have tunnel vision, or feel sick to your stomach (nauseous). Near-syncope may occur when getting up after sitting or while standing for a long time. It is caused by a sudden decrease in blood flow to the brain. This decrease can result from various causes or triggers, most of which are not serious. However, because near-syncope can sometimes be a sign of something serious, a medical evaluation is required. The specific cause is often not determined. HOME CARE INSTRUCTIONS  Monitor your condition for any changes. The following actions may help to alleviate any discomfort you are experiencing:  Have someone stay with you until you feel stable.  Lie down right away if you start feeling like you might faint. Breathe deeply and steadily. Wait until all the symptoms have passed. Most of these episodes last only a few minutes. You may feel tired for several hours.   Drink enough fluids to keep your urine clear or pale yellow.   If you are taking blood pressure or heart medicine, get up slowly when seated or lying down. Take several minutes to sit and then stand. This can reduce dizziness.  Follow up with your health care provider as directed. SEEK IMMEDIATE MEDICAL CARE IF:   You have a severe  headache.   You have unusual pain in the chest, abdomen, or back.   You are bleeding from the mouth or rectum, or you have black or tarry stool.   You have an irregular or very fast heartbeat.   You have repeated fainting or have seizure-like jerking during an episode.   You faint when sitting or lying down.   You have confusion.   You have difficulty walking.   You have severe weakness.   You have vision problems.  MAKE SURE YOU:   Understand these instructions.  Will watch your condition.  Will get help right away if you are not doing well or get worse. Document Released: 08/11/2005 Document Revised: 04/13/2013 Document Reviewed: 01/14/2013 [facility] Patient Information 2014 [employer], [place].

## 2013-12-21 NOTE — Progress Notes (Signed)
   Subjective:    Patient ID: Nicole Villanueva, female    DOB: 1966/01/28, 48 y.o.   MRN: [id number]  HPI Patient presents with cough and cold. This afternoon took lisinopril at noon and got low BP at 4pm that made her weak and was feeling faint. Arms and legs felt very cold and felt like the veins in her arms were hot. Felt lightheaded and dizzy. Also had tightness in throat. Has happened several before but not as badly. At 4pm blood sugar was 237. BP at the time of her dizziness at home was 60/57 at home. No vomiting but some nausea and diarrhea today. Now feels much better and back to normal. No associated chest pain or shortness of breath but did . Episode lasted for 45 min - 1 hr. By the time that she was in the waiting room her symptoms were improving and then resolved. No history of heart problems. Occasionally does get some heart palpitations. Was feeling anxious at the time.   DM poorly controlled. Taking 12 units lantus at night. BG usually in the 200s. Does not check regularly  Review of Systems  All other systems reviewed and are negative.     Objective:   Physical Exam  Constitutional: She is oriented to person, place, and time. She appears well-developed and well-nourished.  HENT:  Head: Normocephalic and atraumatic.  Mouth/Throat: No oropharyngeal exudate.  Eyes: Conjunctivae are normal. Pupils are equal, round, and reactive to light. No scleral icterus.  Neck: Normal range of motion. Neck supple.  Cardiovascular: Normal rate, regular rhythm, normal heart sounds and intact distal pulses.   No murmur heard. Pulmonary/Chest: Effort normal and breath sounds normal. No respiratory distress. She has no wheezes.  Musculoskeletal: Normal range of motion.  Lymphadenopathy:    She has no cervical adenopathy.  Neurological: She is alert and oriented to person, place, and time.  Skin: Skin is warm and dry.  Psychiatric: She has a normal mood and affect. Her behavior is normal.       Assessment & Plan:  #1. Dizziness/Lightheadness/Presyncope associated with palpitations in diabetic middle age female - EKG: NSR - Labs: CBC, BG, CMP, TSH - Refer to cardiology - Return precautions - Hold lisinopril  #2. Hyperglycemia - Low concern for ketoacidosis as blood sugar 240 at 4pm - Increase lantus to 17 units qhs - Check BG tid and record - f/u within 1 week and bring log - Refill testing supplies

## 2013-12-22 LAB — COMPREHENSIVE METABOLIC PANEL
ALT: 30 U/L (ref 0–35)
AST: 25 U/L (ref 0–37)
Albumin: 4.2 g/dL (ref 3.5–5.2)
Alkaline Phosphatase: 87 U/L (ref 39–117)
BUN: 13 mg/dL (ref 6–23)
CO2: 26 mEq/L (ref 19–32)
Calcium: 9.9 mg/dL (ref 8.4–10.5)
Chloride: 96 mEq/L (ref 96–112)
Creat: 0.81 mg/dL (ref 0.50–1.10)
Glucose, Bld: 431 mg/dL — ABNORMAL HIGH (ref 70–99)
Potassium: 4.3 mEq/L (ref 3.5–5.3)
Sodium: 133 mEq/L — ABNORMAL LOW (ref 135–145)
Total Bilirubin: 0.4 mg/dL (ref 0.2–1.2)
Total Protein: 7.1 g/dL (ref 6.0–8.3)

## 2013-12-22 LAB — TSH: TSH: 0.808 u[IU]/mL (ref 0.350–4.500)

## 2013-12-22 NOTE — Progress Notes (Signed)
EKG read and patient discussed with Dr. Neomia DearVoss. Agree with assessment and plan of care per her note.

## 2013-12-31 ENCOUNTER — Other Ambulatory Visit: Payer: Self-pay | Admitting: Physician Assistant

## 2014-01-06 ENCOUNTER — Encounter: Payer: Self-pay | Admitting: Internal Medicine

## 2014-01-06 ENCOUNTER — Ambulatory Visit: Payer: 59 | Attending: Internal Medicine | Admitting: Internal Medicine

## 2014-01-06 VITALS — BP 103/68 | HR 75 | Temp 98.2°F | Resp 16 | Ht 62.0 in | Wt 151.6 lb

## 2014-01-06 DIAGNOSIS — E119 Type 2 diabetes mellitus without complications: Secondary | ICD-10-CM

## 2014-01-06 DIAGNOSIS — R232 Flushing: Secondary | ICD-10-CM

## 2014-01-06 DIAGNOSIS — Z7689 Persons encountering health services in other specified circumstances: Secondary | ICD-10-CM

## 2014-01-06 DIAGNOSIS — Z794 Long term (current) use of insulin: Secondary | ICD-10-CM | POA: Insufficient documentation

## 2014-01-06 DIAGNOSIS — E785 Hyperlipidemia, unspecified: Secondary | ICD-10-CM | POA: Insufficient documentation

## 2014-01-06 DIAGNOSIS — N951 Menopausal and female climacteric states: Secondary | ICD-10-CM

## 2014-01-06 DIAGNOSIS — Z79899 Other long term (current) drug therapy: Secondary | ICD-10-CM | POA: Insufficient documentation

## 2014-01-06 DIAGNOSIS — I10 Essential (primary) hypertension: Secondary | ICD-10-CM | POA: Insufficient documentation

## 2014-01-06 DIAGNOSIS — Z7189 Other specified counseling: Secondary | ICD-10-CM

## 2014-01-06 LAB — GLUCOSE, POCT (MANUAL RESULT ENTRY): POC Glucose: 168 mg/dl — AB (ref 70–99)

## 2014-01-06 LAB — POCT GLYCOSYLATED HEMOGLOBIN (HGB A1C): Hemoglobin A1C: 10.7

## 2014-01-06 MED ORDER — PAROXETINE MESYLATE 7.5 MG PO CAPS: 7.5000 mg | ORAL_CAPSULE | Freq: Every day | ORAL | Status: DC

## 2014-01-06 MED ORDER — INSULIN GLARGINE 100 UNIT/ML SOLOSTAR PEN: PEN_INJECTOR | SUBCUTANEOUS | Status: DC

## 2014-01-06 NOTE — Patient Instructions (Signed)
MiPlato, Departamento de Agricultura de Estados [place]idos Architect) (MyPlate, [employer]) La cantidad que debe comer de cada alimento depende de su edad, sexo y [place] fsica. Para encontrar las cantidades adecuadas para usted, visite [email or web address] Consejos:  Disfrute la comida, pero coma menos.  Evite las porciones [employer]. CEREALES   Por lo menos la mitad de los cereales que consume deben ser integrales.  Para un plan de alimentacin de 2000caloras por da, coma 6onzas (170gramos) diariamente.  Una onza es aproximadamente 1rodaja de pan, 1taza de cereal o mediataza de arroz, cereal o pasta cocidos. VEGETALES  La mitad del plato debe tener frutas y verduras.  Para un plan de alimentacin de 2000caloras por da, coma 2tazas y media diariamente.  Una taza es aproximadamente 1taza de verduras o de jugo de verduras crudas o cocidas, o 2tazas de verduras de hojas verdes crudas. FRUTAS  La mitad del plato debe tener frutas y verduras.  Para un plan de alimentacin de 2000caloras por da, coma 2tazas diariamente.  Una taza es aproximadamente 1taza de frutas o de jugo 100% de frutas, o media taza de frutas desecadas. LCTEOS  Cambie a la [employer] o con bajo contenido graso (1%).  Para un plan de alimentacin de 2000caloras por da, tome 3tazas diariamente.  Una taza es aproximadamente 1taza de [place], yogur o [place] de soja (bebidas de soja), 1onza y media (42gramos) de queso natural o 2onzas (57gramos) de queso procesado. PROTENAS  Para un plan de alimentacin de 2000caloras por da, coma 5onzas y media (156gramos) diariamente.  Una onza es aproximadamente 1onza (28gramos) de carne de res, ave o pescado, un cuarto de taza de frijoles cocidos, 1huevo, 1cucharada de [place] de man o media onza (14gramos) de frutos secos o semillas. ACEITES Y CALORAS VACAS  Solo se recomiendan pequeas cantidades de aceites.  Las  caloras vacas son aquellas que provienen de las grasas slidas o los azcares agregados.  Compare la cantidad de sodio de los alimentos tales como la sopa, el pan y las comidas [place], y elija aquellos que menos tienen.  Beba agua en lugar de bebidas azucaradas. Document Released: 06/01/2013 [facility] Patient Information 2014 [place], [place]. DASH Diet The DASH diet stands for "Dietary Approaches to Stop Hypertension." It is a healthy eating plan that has been shown to reduce high blood pressure (hypertension) in as little as 14 days, while also possibly providing other significant health benefits. These other health benefits include reducing the risk of breast cancer after menopause and reducing the risk of type 2 diabetes, heart disease, colon cancer, and stroke. Health benefits also include weight loss and slowing kidney failure in patients with chronic kidney disease.  DIET GUIDELINES  Limit salt (sodium). Your diet should contain less than 1500 mg of sodium daily.  Limit refined or processed carbohydrates. Your diet should include mostly whole grains. Desserts and added sugars should be used sparingly.  Include small amounts of heart-healthy fats. These types of fats include nuts, oils, and tub margarine. Limit saturated and trans fats. These fats have been shown to be harmful in the body. CHOOSING FOODS  The following food groups are based on a 2000 calorie diet. See your Registered Dietitian for individual calorie needs. Grains and Grain Products (6 to 8 servings daily)  Eat More Often: Whole-wheat bread, brown rice, whole-grain or wheat pasta, quinoa, popcorn without added fat or salt (air popped).  Eat Less Often: White bread, white pasta, white rice, cornbread. Vegetables (4 to 5 servings daily)  Eat More Often: Fresh, frozen, and canned vegetables. Vegetables may be raw, steamed, roasted, or grilled with a minimal amount of fat.  Eat Less Often/Avoid: Creamed or fried  vegetables. Vegetables in a cheese sauce. Fruit (4 to 5 servings daily)  Eat More Often: All fresh, canned (in natural juice), or frozen fruits. Dried fruits without added sugar. One hundred percent fruit juice ( cup [237 mL] daily).  Eat Less Often: Dried fruits with added sugar. Canned fruit in light or heavy syrup. Foot LockerLean Meats, Fish, and Poultry (2 servings or less daily. One serving is 3 to 4 oz [85-114 g]).  Eat More Often: Ninety percent or leaner ground beef, tenderloin, sirloin. Round cuts of beef, chicken breast, Malaw[place]urkey breast. All fish. Grill, bake, or broil your meat. Nothing should be fried.  Eat Less Often/Avoid: Fatty cuts of meat, Malaw[place]urkey, or chicken leg, thigh, or wing. Fried cuts of meat or fish. Dairy (2 to 3 servings)  Eat More Often: Low-fat or fat-free milk, low-fat plain or light yogurt, reduced-fat or part-skim cheese.  Eat Less Often/Avoid: Milk (whole, 2%).Whole milk yogurt. Full-fat cheeses. Nuts, Seeds, and Legumes (4 to 5 servings per week)  Eat More Often: All without added salt.  Eat Less Often/Avoid: Salted nuts and seeds, canned beans with added salt. Fats and Sweets (limited)  Eat More Often: Vegetable oils, tub margarines without trans fats, sugar-free gelatin. Mayonnaise and salad dressings.  Eat Less Often/Avoid: Coconut oils, palm oils, butter, stick margarine, cream, half and half, cookies, candy, pie. FOR MORE INFORMATION The Dash Diet Eating Plan: www.dashdiet.org Document Released: 07/31/2011 Document Revised: 11/03/2011 Document Reviewed: 07/31/2011 [facility] Patient Information 2014 [place]ExitCare, [place]. Diabetes y cuidados del pie (Diabetes and Foot Care) La diabetes puede ser la causa de que el flujo sanguneo (circulacin) en las piernas y los pies sea deficiente. Debido a esto, la piel de los pies se torna ms delgada, se rompe con facilidad y se cura ms lentamente. La piel puede estar seca, despellejarse y Lobbyistagrietarse. Tambin pueden estar  daados los nervios de las piernas y de los pies lo que provoca una disminucin de la sensibilidad. Es posible que no advierta heridas ms pequeas en los pies, que pueden causar infecciones graves. Cuidar sus pies es una de las cosas ms importantes que puede hacer por usted mismo.  INSTRUCCIONES PARA EL CUIDADO EN EL HOGAR  Use siempre calzado, an dentro de su casa. No camine descalzo. Caminar descalzo facilita que se lastime.  Controle sus pies diariamente para observar ampollas, cortes y enrojecimiento. Si no puede ver la planta del pie, use un espejo o pdale ayuda a Engineer, maintenance (IT)otra persona.  Lave sus pies con agua tibia (no use agua caliente) y un [place] Territoryjabn suave. Seque bien sus pies, y la zona The Krogerentre los dedos dando Candelaria Arenaspalmaditas, hasta que estn completamente secos. Noremoje los pies, ya que esto puede resecar la piel.  Aplique una locin hidratante o vaselina (que no contenga alcohol ni perfume) en los pies y en las uas secas y Panamaquebradizas. No aplique locin entre los dedos.  Recorte las uas en forma recta. No escarbe debajo de las uas o alrededor General Millsde las cutculas. Lime los bordes de las uas con una lima o esmeril.  No corte las durezas o callosidades, ni trate de quitarlas con medicamentos.  Use calcetines de algodn o medias El [place] Corporationlimpias todos los das. Asegrese de que no le PACCAR Incajusten demasiado. Nouse calcetines que le lleguen a las rodillas, ya que podran disminuir el flujo de Weldonsangre a las  piernas.  Use zapatos de cuero que le queden bien y que sean acolchados. Para amoldar los zapatos, clcelos slo algunas horas por da. Esto evitar lesiones en los pies. Revise siempre los zapatos antes de ponerlos para asegurarse de que no haya objetos en su interior.  No cruce las piernas. Esto puede disminuir el flujo de sangre a los pies.  Si algo le ha raspado, cortado o lastimado la piel de los pies, mantenga la piel de esa zona limpia y [place] Rid[place]eca. Debe higienizar estas zonas con agua y un jabn suave. No limpie la  zona con agua oxigenada, alcohol ni yodo.  Cuando se quite un vendaje adhesivo, asegrese de no daar la piel.  Si tiene una herida, obsrvela varias veces por da para asegurarse de que se est curando.  No use bolsas de agua caliente ni almohadillas trmicas. Podran causar quemaduras. Si ha perdido la sensibilidad en los pies o las piernas, no sabr lo que le est sucediendo hasta que sea demasiado tarde.  Asegrese de que su mdico le haga un examen completo de los pies por lo menos una vez al ao, o con ms frecuencia si usted tiene Caremark Rxproblemas en los pies. Informe todos los cortes, llagas o moretones a su mdico inmediatamente. SOLICITE ATENCIN MDICA SI:   Tiene una lesin que no se cura.  Tiene cortes o rajaduras en la piel.  Tiene una ua encarnada.  Nota una zona irritada en las piernas o los pies.  Siente una sensacin de ardor u hormigueo en las piernas o los pies.  Siente dolor o calambres en las piernas o los pies.  Las piernas o los pies estn adormecidos.  Siente los pies siempre fros. SOLICITE ATENCIN MDICA DE INMEDIATO SI:   Presenta enrojecimiento, hinchazn o aumento del dolor en una herida.  Nota una lnea roja que sube por pierna.  Aparece pus en la herida.  Le sube la fiebre o segn lo que le indique el mdico.  Advierte un olor ftido que proviene de una lcera o una herida. Document Released: 08/11/2005 Document Revised: 04/13/2013 [facility] Patient Information 2014 [place]ExitCare, [place]LLC. Diabetes y Doroteo Glassmanactividad fsica (Diabetes and Exercise) Hacer actividad fsica con regularidad es muy importante. No se trata slo de perder peso. Tiene muchos otros beneficios, como por ejemplo:  Mejorar el [place] Lakeestado de [place] Vernonagilidad, flexibilidad y resistencia.  Aumenta la densidad sea.  Ayuda a Art gallery managercontrolar el peso.  Disminuye la Art gallery managergrasa corporal.  Aumenta la fuerza muscular.  Reduce el estrs y las tensiones.  Mejora el estado de salud general. Las personas diabticas  que realizan actividad fsica tienen beneficios adicionales debido al ejercicio:  Reduce el apetito.  Mejora la utilizacin del azcar (glucosa) por parte del organismo.  Ayuda a disminuir o Information systems managercontrolar la glucosa en sangre.  Disminuye la presin arterial.  Ayuda a disminuir los lpidos en sangre (colesterol y triglicridos).  Mejora el uso de la insulina por parte del organismo porque:  Aumenta la sensibilidad del organismo a la insulina.  Reduce las necesidades de insulina del organismo.  Disminuye el riesgo de enfermedad cardaca por la actividad fsica.  Disminuye el colesterol y los triglicridos.  Aumenta los niveles de colesterol bueno (como las lipoprotenas de alta densidad [HDL]) en el organismo.  Disminuye los niveles de glucosa en Viviansangre. SU PLAN DE ACTIVIDAD  Elija una actividad que disfrute y establezca objetivos realistas. Su mdico o educador en diabetes podrn ayudarlo a Tax adviserrealizar una actividad que lo beneficie. Podr dividir las actividades en 2 o 3 sesiones  a lo Paediatric nurse. Hacer esto es tan bueno como hacer una sesin prolongada. Las ideas para los ejercicios incluyen:  Lleve a Multimedia programmer.  Utilice las Microbiologist del ascensor.  Baile su cancin favorita.  Haga sus ejercicios favoritos con Leisure centre manager. RECOMENDACIONES PARA REALIZAR EJERCICIOS CUANDO SE TIENE DIABETES TIPO 1 O TIPO 2   Controle la glucosa en sangre antes de comenzar. Si el nivel de glucosa en sangre es de ms de 240 mg/dl, controle las cetonas en la [place]. Si hay cetonas, no realice actividad fsica.  Evite inyectarse insulina en las zonas del cuerpo que ejercitar. Por ejemplo, evite inyectarse insulina en:  Los brazos, si juega al tenis.  Las piernas, si corre.  Lleve un registro de:  Alimentos que consume antes y despus de Tour manager.  Los momentos esperables de picos de accin de la insulina.  Los niveles de glucosa en sangre antes y despus de hacer  ejercicios.  El tipo y cantidad de [place].  Revise los registros con su mdico. El mdico lo ayudar a Environmental education officer pautas para ajustar la cantidad de alimento y las cantidades de insulina antes y despus de Radio producer ejercicios.  Si toma insulina o agentes hipoglucemiantes por va oral, observe si hay signos y sntomas de hipoglucemia. Ellos son:  Mareos.  Temblores.  Sudoracin.  Escalofros.  Confusin.  Beba gran cantidad de agua mientras hace ejercicios para evitar la deshidratacin o el infarto. Durante la actividad fsica se pierde Data processing manager.  Comente con su mdico antes de comenzar un programa de actividad fsica para verificar que sea seguro para usted. Recuerde, cualquier actividad es mejor que ninguna. Document Released: 08/31/2007 Document Revised: 04/13/2013 [facility] Patient Information 2014 [place], [place].

## 2014-01-06 NOTE — Progress Notes (Signed)
Patient is here to establish care for DM and hyperlipidemia States she almost passed out 3 weeks ago but was able to sit down prior to falling. Was told to stop lisinopril at that time. Referred to social worker for PHQ-9 score of 22. Provider aware.

## 2014-01-07 NOTE — Progress Notes (Signed)
Patient ID: Nicole BoyerMartha A Bravo Villanueva, female   DOB: [birth year], 48 y.o.   MRN: [id number]   Nicole SparMartha Bravo Villanueva, is a 48 y.o. female  [id number]  [id number]  DOB - [birth year]  CC:  Chief Complaint  Patient presents with  . Establish Care  . Diabetes       HPI: Nicole SparMartha Bravo Villanueva is a 48 y.o. female here today to establish medical care.  Patient reports that she has been diabetic for several years and has maintained on Metformin and nightly lantus without complications.  Patient reports that she is taking 17 units of insulin at this time.  Patient admits to missing frequent doses of lantus and metformin in the past.  She infrequently checks her blood sugar due to her busy schedule.  Patient states that she knows how to eat properly because she has receive diabetes education but she does not want to eat healthy.  Patient refused to try diabetes education and nutrition classes again.     No Known Allergies Past Medical History  Diagnosis Date  . Hypertension   . Diabetes mellitus   . Hyperlipidemia    Current Outpatient Prescriptions on File Prior to Visit  Medication Sig Dispense Refill  . glucose blood (FREESTYLE TEST STRIPS) test strip Use as instructed  100 each  12  . Lancet Device MISC Check blood sugar 3x per day  90 each  5  . metFORMIN (GLUCOPHAGE) 1000 MG tablet Take 1 tablet (1,000 mg total) by mouth 2 (two) times daily with a meal. Needs office visit and labs for further refills of all medications  60 tablet  3  . pravastatin (PRAVACHOL) 40 MG tablet Take 1 tablet (40 mg total) by mouth at bedtime.  30 tablet  3   No current facility-administered medications on file prior to visit.   Family History  Problem Relation Age of Onset  . Diabetes Mother   . Diabetes Father    History   Social History  . Marital Status: Single    Spouse Name: N/A    Number of Children: N/A  . Years of Education: N/A   Occupational History  . Not on file.   Social  History Main Topics  . Smoking status: Never Smoker   . Smokeless tobacco: Not on file  . Alcohol Use: No  . Drug Use: No  . Sexual Activity: Not on file   Other Topics Concern  . Not on file   Social History Narrative  . No narrative on file    Review of Systems: Constitutional: Negative for fever, chills, diaphoresis, activity change, appetite change and fatigue. HENT: Negative for ear pain, nosebleeds, congestion, facial swelling, rhinorrhea, neck pain, neck stiffness and ear discharge.  Eyes: Negative for pain, discharge, redness, itching and visual disturbance. Respiratory: Negative for cough, choking, chest tightness, shortness of breath, wheezing and stridor.  Cardiovascular: Negative for chest pain, palpitations and leg swelling. Gastrointestinal: Negative for abdominal distention. Genitourinary: Negative for dysuria, urgency, frequency, hematuria, flank pain, decreased urine volume, difficulty urinating and dyspareunia.  Musculoskeletal: Negative for back pain, joint swelling, arthralgia and gait problem. Neurological: Negative for tremors, seizures, syncope, facial asymmetry, speech difficulty, weakness, numbness and headaches. Positive for dizziness, light-headedness, and near syncopal episodes upon going from sitting to standing. Hematological: Negative for adenopathy. Does not bruise/bleed easily. Psychiatric/Behavioral: Negative for hallucinations, behavioral problems, confusion, dysphoric mood, decreased concentration and agitation.    Objective:   Filed Vitals:   01/06/14 1701  BP: 103/68  Pulse:  75  Temp: 98.2 F (36.8 C)  Resp: 16    Physical Exam: Constitutional: Patient appears well-developed and well-nourished. No distress. HENT: Normocephalic, atraumatic, External right and left ear normal. Oropharynx is clear and moist.  Eyes: Conjunctivae and EOM are normal. PERRLA, no scleral icterus. Neck: Normal ROM. Neck supple. No JVD. No tracheal deviation. No  thyromegaly. CVS: RRR, S1/S2 +, no murmurs, no gallops, no carotid bruit.  Pulmonary: Effort and breath sounds normal, no stridor, rhonchi, wheezes, rales.  Abdominal: Soft. BS +, no distension, tenderness, rebound or guarding.  Musculoskeletal: Normal range of motion. No edema and no tenderness.  Lymphadenopathy: No lymphadenopathy noted, cervical, Neuro: Alert. Normal reflexes, muscle tone coordination. No cranial nerve deficit. Skin: Skin is warm and dry. No rash noted. Not diaphoretic. No erythema. No pallor. Psychiatric: Normal mood and affect. Behavior, judgment, thought content normal. DIABETIC FOOT EXAM: Examination of the feet reveals normal posterior tibial and dorsalis pedis pulses. Skin to palpation and inspection is intact, without lesions or corns. No discoloration is present. Monofilament nylon test reveals normal sensation bilaterally over plantar surfaces of distal great toe, first, third, and fifth metatarsal heads. Brisk capillary refill    Lab Results  Component Value Date   WBC 11.3* 12/21/2013   HGB 12.6 12/21/2013   HCT 39.0 12/21/2013   MCV 90.9 12/21/2013   PLT 429* 06/16/2007   Lab Results  Component Value Date   CREATININE 0.81 12/21/2013   BUN 13 12/21/2013   NA 133* 12/21/2013   K 4.3 12/21/2013   CL 96 12/21/2013   CO2 26 12/21/2013    Lab Results  Component Value Date   HGBA1C 10.7 01/06/2014   Lipid Panel     Component Value Date/Time   CHOL 242* 03/30/2013 1203   TRIG 346* 03/30/2013 1203   HDL 38* 03/30/2013 1203   CHOLHDL 6.4 03/30/2013 1203   VLDL 69* 03/30/2013 1203   LDLCALC 135* 03/30/2013 1203       Assessment and plan:   Nicole Villanueva was seen today for establish care and diabetes.  Diagnoses and associated orders for this visit:  Diabetes mellitus - Glucose (CBG) - HgB A1c - Ambulatory referral to Ophthalmology - Insulin Glargine (LANTUS SOLOSTAR) 100 UNIT/ML Solostar Pen; Inject 20 units into skin every night. Patient given 10 minutes of diabetes  education and I have stressed the importance of glycemic control to patient.  She has been given numerous examples on how diabetes can effect each organ system.   Patient will D/c lisinopril d/t to low BP and near syncopal episodes  Hot flashes - PARoxetine Mesylate 7.5 MG CAPS; Take 7.5 mg by mouth daily. Explained this will assist with depressive symptoms and hot flashes.  Patient will return in 2 months to reevaluate.   Encounter to establish care - TSH; Future - Lipid panel; Future - CBC; Future - COMPLETE METABOLIC PANEL WITH GFR; Future   Return in about 1 week (around 01/13/2014) for Lab Visit, 2 months with PCP for hot flash.   Holland CommonsValerie Quadir Muns, NP-C [facility] [phone] 01/07/2014, 3:56 PM

## 2014-01-10 ENCOUNTER — Other Ambulatory Visit: Payer: 59

## 2014-01-12 ENCOUNTER — Ambulatory Visit: Payer: 59 | Admitting: *Deleted

## 2014-01-12 ENCOUNTER — Ambulatory Visit: Payer: 59 | Attending: Internal Medicine

## 2014-01-12 DIAGNOSIS — Z0000 Encounter for general adult medical examination without abnormal findings: Secondary | ICD-10-CM | POA: Insufficient documentation

## 2014-01-12 DIAGNOSIS — Z658 Other specified problems related to psychosocial circumstances: Secondary | ICD-10-CM

## 2014-01-12 DIAGNOSIS — Z7689 Persons encountering health services in other specified circumstances: Secondary | ICD-10-CM

## 2014-01-12 LAB — COMPLETE METABOLIC PANEL WITH GFR
ALT: 24 U/L (ref 0–35)
AST: 20 U/L (ref 0–37)
Albumin: 3.7 g/dL (ref 3.5–5.2)
Alkaline Phosphatase: 93 U/L (ref 39–117)
BUN: 12 mg/dL (ref 6–23)
CO2: 27 mEq/L (ref 19–32)
Calcium: 9 mg/dL (ref 8.4–10.5)
Chloride: 101 mEq/L (ref 96–112)
Creat: 0.46 mg/dL — ABNORMAL LOW (ref 0.50–1.10)
GFR, Est African American: >89 mL/min
GFR, Est Non African American: >89 mL/min
Glucose, Bld: 222 mg/dL — ABNORMAL HIGH (ref 70–99)
Potassium: 4.7 mEq/L (ref 3.5–5.3)
Sodium: 135 mEq/L (ref 135–145)
Total Bilirubin: 0.5 mg/dL (ref 0.2–1.2)
Total Protein: 6.8 g/dL (ref 6.0–8.3)

## 2014-01-12 LAB — CBC
HCT: 39.6 % (ref 36.0–46.0)
Hemoglobin: 13.5 g/dL (ref 12.0–15.0)
MCH: 29.6 pg (ref 26.0–34.0)
MCHC: 34.1 g/dL (ref 30.0–36.0)
MCV: 86.8 fL (ref 78.0–100.0)
Platelets: 345 10*3/uL (ref 150–400)
RBC: 4.56 MIL/uL (ref 3.87–5.11)
RDW: 13.1 % (ref 11.5–15.5)
WBC: 8 10*3/uL (ref 4.0–10.5)

## 2014-01-12 LAB — LIPID PANEL
Cholesterol: 210 mg/dL — ABNORMAL HIGH (ref 0–200)
HDL: 35 mg/dL — ABNORMAL LOW (ref >39)
LDL Cholesterol: 117 mg/dL — ABNORMAL HIGH (ref 0–99)
Total CHOL/HDL Ratio: 6 Ratio
Triglycerides: 288 mg/dL — ABNORMAL HIGH (ref <150)
VLDL: 58 mg/dL — ABNORMAL HIGH (ref 0–40)

## 2014-01-12 NOTE — Progress Notes (Signed)
LCSW met with patient who was reluctant to identify that she had some challenges with low mood.  Patient has some moderate symptoms and challenges with sleep.  LCSW recommended some increased self care as well as recommendations to regulate and manage sleep cycle.  Patient was open and will follow up at 2 month follow up appointment.  Christene Lye MSW, LCSW Duration 20 minutes

## 2014-01-13 LAB — TSH: TSH: 0.959 u[IU]/mL (ref 0.350–4.500)

## 2014-01-20 ENCOUNTER — Telehealth: Payer: Self-pay | Admitting: *Deleted

## 2014-01-20 NOTE — Telephone Encounter (Signed)
Message copied by Raynelle Chary on Fri Jan 20, 2014  2:15 PM ------      Message from: Holland Commons A      Created: Thu Jan 19, 2014  9:31 PM       Please find out if patient has been taking pravastatin daily. Re-educate patient on dietary and lifestyle modifications to help lower cholesterol. Explain importance of lipid management on cardiovascular health. Let her know that triglycerides may go down with tighter control of her diabetes. Thanks ------

## 2014-01-20 NOTE — Telephone Encounter (Signed)
Left message

## 2014-02-17 ENCOUNTER — Ambulatory Visit: Payer: Self-pay | Admitting: Family Medicine

## 2014-03-08 ENCOUNTER — Ambulatory Visit: Payer: 59 | Admitting: Internal Medicine

## 2014-03-09 ENCOUNTER — Encounter: Payer: Self-pay | Admitting: Cardiology

## 2014-03-09 ENCOUNTER — Ambulatory Visit (INDEPENDENT_AMBULATORY_CARE_PROVIDER_SITE_OTHER): Payer: 59 | Admitting: Cardiology

## 2014-03-09 VITALS — BP 115/70 | HR 67 | Ht 61.0 in | Wt 150.0 lb

## 2014-03-09 DIAGNOSIS — E1165 Type 2 diabetes mellitus with hyperglycemia: Secondary | ICD-10-CM

## 2014-03-09 DIAGNOSIS — R55 Syncope and collapse: Secondary | ICD-10-CM | POA: Insufficient documentation

## 2014-03-09 DIAGNOSIS — E119 Type 2 diabetes mellitus without complications: Secondary | ICD-10-CM

## 2014-03-09 NOTE — Patient Instructions (Signed)
The current medical regimen is effective;  continue present plan and medications.  Your physician has requested that you have an echocardiogram. Echocardiography is a painless test that uses sound waves to create images of your heart. It provides your doctor with information about the size and shape of your heart and how well your heart's chambers and valves are working. This procedure takes approximately one hour. There are no restrictions for this procedure.  Please drink plenty of fluid to help keep yourself hydrated.  Follow up as needed.

## 2014-03-09 NOTE — Progress Notes (Signed)
[address] [place], [place]  [address] Phone: [phone]) [phone] Fax:  [phone]  Date:  03/09/2014   ID:  Thyra BreedMartha A Bravo Fernandez, DOB March 17, 1966, MRN [id number]  PCP:  Holland CommonsKECK, VALERIE, NP   History of Present Illness: Othella BoyerMartha A Bravo Fernandez is a 48 y.o. female here for the evaluation of dizziness. In April, while on low-dose lisinopril, she experienced 2 episodes of near syncope where her vision became blurred, black and she developed pallor, diaphoresis, profuse sweating, nausea. No associated chest pain with these episodes. No associated palpitations. Over time, the past. She did go to urgent care after one of these episodes and was sent to me for further evaluation.  Traditionally, her blood pressure has always been quite low. She is currently off of lisinopril because of this. She has not had any further episodes similar to description as above.  She does have occasional atypical chest discomfort and also describes occasional feeling of heaviness in her arms, occasional paresthesias, posterior neck discomfort.  She states that when she was born that she had an arrhythmia but that this past. She had 2 pregnancies that were uneventful from a cardiac perspective.  Her daughter is here present with her today and seems intergral with her care.  Wt Readings from Last 3 Encounters:  03/09/14 150 lb (68.04 kg)  01/06/14 151 lb 9.6 oz (68.765 kg)  12/21/13 152 lb (68.947 kg)     Past Medical History  Diagnosis Date  . Hypertension   . Diabetes mellitus   . Hyperlipidemia     Past Surgical History  Procedure Laterality Date  . Cholecystectomy      Current Outpatient Prescriptions  Medication Sig Dispense Refill  . glucose blood (FREESTYLE TEST STRIPS) test strip Use as instructed  100 each  12  . Insulin Glargine (LANTUS SOLOSTAR) 100 UNIT/ML Solostar Pen Inject 20 units into skin every night.  15 mL  1  . Lancet Device MISC Check blood sugar 3x per day  90  each  5  . metFORMIN (GLUCOPHAGE) 1000 MG tablet Take 1 tablet (1,000 mg total) by mouth 2 (two) times daily with a meal. Needs office visit and labs for further refills of all medications  60 tablet  3  . pravastatin (PRAVACHOL) 40 MG tablet Take 1 tablet (40 mg total) by mouth at bedtime.  30 tablet  3   No current facility-administered medications for this visit.    Allergies:   No Known Allergies  Social History:  The patient  reports that she has never smoked. She does not have any smokeless tobacco history on file. She reports that she does not drink alcohol or use illicit drugs.   Family History  Problem Relation Age of Onset  . Diabetes Mother   . Diabetes Father   Mother had CHF.  ROS:  Please see the history of present illness.   Arm numbness, paresthesias , denies bleeding, orthopnea.  All other systems reviewed and negative.   PHYSICAL EXAM: VS:  BP 115/70  Pulse 67  Ht 5\' 1"  (1.549 m)  Wt 150 lb (68.04 kg)  BMI 28.36 kg/m2  LMP 09/29/2011 Well nourished, well developed, in no acute distress HEENT: normal, [place]/AT, EOMI Neck: no JVD, normal carotid upstroke, no bruit Cardiac:  normal S1, S2; RRR; no murmur Lungs:  clear to auscultation bilaterally, no wheezing, rhonchi or rales Abd: soft, nontender, no hepatomegaly, no bruits Ext: no edema, 2+ distal pulses Skin: warm  and dry GU: deferred Neuro: no focal abnormalities noted, AAO x 3  EKG:  Sinus rhythm, normal intervals, minimal J-point elevation precordial leads. Normal. Blood work normal, CBC, metabolic profile, TSH all reviewed. Prior medical records reviewed.   ASSESSMENT AND PLAN:  1. Near syncope/vasovagal syncope-given her constellation of symptoms of dizziness, diaphoresis, pallor, nausea, decreased visual acuity secondary to hypotension, it is likely that she was experiencing vasovagal symptoms. At that time, she was taking lisinopril. Potentially, this may have decreased her blood pressure to the point  where she began the vagal prodrome. I encouraged hydration and in fact at times increased salt intake. I will nonetheless check an echocardiogram to ensure proper structure and function of her heart especially with her history of arrhythmia as a youth. She clearly states that she did not feel any palpitations surrounding these episodes. 2. Diabetes-continue to encourage control. Coronary artery disease equivalent. Aggressive primary prevention. Increased risk of stroke, MI. 3. Followup with echocardiogram. If symptoms return or become more worrisome, she is to contact me. Further investigation with event monitor may be helpful in the future if syncope becomes a recurrent pain or if etiology seems less suggestive of vagal prodrome.  Signed, Donato Schultz, MD [facility]  03/09/2014 11:32 AM

## 2014-03-11 ENCOUNTER — Other Ambulatory Visit: Payer: Self-pay | Admitting: Physician Assistant

## 2014-03-13 ENCOUNTER — Other Ambulatory Visit: Payer: Self-pay | Admitting: Physician Assistant

## 2014-03-21 ENCOUNTER — Ambulatory Visit (HOSPITAL_COMMUNITY): Payer: 59 | Attending: Internal Medicine | Admitting: Radiology

## 2014-03-21 DIAGNOSIS — E785 Hyperlipidemia, unspecified: Secondary | ICD-10-CM | POA: Insufficient documentation

## 2014-03-21 DIAGNOSIS — E119 Type 2 diabetes mellitus without complications: Secondary | ICD-10-CM | POA: Diagnosis not present

## 2014-03-21 DIAGNOSIS — I519 Heart disease, unspecified: Secondary | ICD-10-CM | POA: Insufficient documentation

## 2014-03-21 DIAGNOSIS — R55 Syncope and collapse: Secondary | ICD-10-CM

## 2014-03-21 DIAGNOSIS — I10 Essential (primary) hypertension: Secondary | ICD-10-CM | POA: Insufficient documentation

## 2014-03-21 NOTE — Progress Notes (Signed)
Echocardiogram performed.  

## 2014-03-22 ENCOUNTER — Other Ambulatory Visit: Payer: Self-pay | Admitting: *Deleted

## 2014-03-22 DIAGNOSIS — E119 Type 2 diabetes mellitus without complications: Secondary | ICD-10-CM

## 2014-03-24 NOTE — Telephone Encounter (Signed)
Please call this patient and have her come back for a visit before this medication is refilled.  Or at least have her come in for nurse visit to look at [facility] log.

## 2014-03-29 ENCOUNTER — Ambulatory Visit: Payer: 59 | Attending: Internal Medicine | Admitting: Internal Medicine

## 2014-03-29 ENCOUNTER — Encounter: Payer: Self-pay | Admitting: Internal Medicine

## 2014-03-29 VITALS — BP 124/79 | HR 87 | Temp 99.3°F | Resp 16 | Wt 151.4 lb

## 2014-03-29 DIAGNOSIS — F329 Major depressive disorder, single episode, unspecified: Secondary | ICD-10-CM | POA: Diagnosis not present

## 2014-03-29 DIAGNOSIS — Z79899 Other long term (current) drug therapy: Secondary | ICD-10-CM | POA: Diagnosis not present

## 2014-03-29 DIAGNOSIS — E119 Type 2 diabetes mellitus without complications: Secondary | ICD-10-CM | POA: Diagnosis not present

## 2014-03-29 DIAGNOSIS — F3289 Other specified depressive episodes: Secondary | ICD-10-CM | POA: Insufficient documentation

## 2014-03-29 DIAGNOSIS — I10 Essential (primary) hypertension: Secondary | ICD-10-CM | POA: Insufficient documentation

## 2014-03-29 DIAGNOSIS — R232 Flushing: Secondary | ICD-10-CM | POA: Insufficient documentation

## 2014-03-29 DIAGNOSIS — E089 Diabetes mellitus due to underlying condition without complications: Secondary | ICD-10-CM

## 2014-03-29 DIAGNOSIS — E785 Hyperlipidemia, unspecified: Secondary | ICD-10-CM

## 2014-03-29 DIAGNOSIS — E139 Other specified diabetes mellitus without complications: Secondary | ICD-10-CM

## 2014-03-29 DIAGNOSIS — Z139 Encounter for screening, unspecified: Secondary | ICD-10-CM

## 2014-03-29 DIAGNOSIS — Z794 Long term (current) use of insulin: Secondary | ICD-10-CM | POA: Diagnosis not present

## 2014-03-29 DIAGNOSIS — N951 Menopausal and female climacteric states: Secondary | ICD-10-CM

## 2014-03-29 DIAGNOSIS — K219 Gastro-esophageal reflux disease without esophagitis: Secondary | ICD-10-CM | POA: Diagnosis not present

## 2014-03-29 LAB — POCT GLYCOSYLATED HEMOGLOBIN (HGB A1C): Hemoglobin A1C: 11.2

## 2014-03-29 LAB — GLUCOSE, POCT (MANUAL RESULT ENTRY): POC Glucose: 381 mg/dl — AB (ref 70–99)

## 2014-03-29 MED ORDER — ATORVASTATIN CALCIUM 40 MG PO TABS: 40.0000 mg | ORAL_TABLET | Freq: Every day | ORAL | Status: DC

## 2014-03-29 MED ORDER — GLYBURIDE 5 MG PO TABS: 5.0000 mg | ORAL_TABLET | Freq: Two times a day (BID) | ORAL | Status: DC

## 2014-03-29 MED ORDER — INSULIN ASPART 100 UNIT/ML ~~LOC~~ SOLN
20.0000 [IU] | Freq: Once | SUBCUTANEOUS | Status: AC
  Administered 2014-03-29: 20 [IU] via SUBCUTANEOUS

## 2014-03-29 MED ORDER — SERTRALINE HCL 50 MG PO TABS: 50.0000 mg | ORAL_TABLET | Freq: Every day | ORAL | Status: DC

## 2014-03-29 MED ORDER — PANTOPRAZOLE SODIUM 40 MG PO TBEC: 40.0000 mg | DELAYED_RELEASE_TABLET | Freq: Every day | ORAL | Status: DC

## 2014-03-29 NOTE — Patient Instructions (Signed)
La diabetes mellitus y los alimentos (Diabetes Mellitus and Food) Es importante que controle su nivel de azcar en la sangre (glucosa). El nivel de glucosa en sangre depende en gran medida de lo que usted come. Comer alimentos saludables en las cantidades adecuadas a lo largo del da, aproximadamente a la misma hora todos los das, lo ayudar a controlar su nivel de glucosa en sangre. Tambin puede ayudarlo a retrasar o evitar el empeoramiento de la diabetes mellitus. Comer de manera saludable incluso puede ayudarlo a mejorar el nivel de presin arterial y a alcanzar o mantener un peso saludable.  CMO PUEDEN AFECTARME LOS ALIMENTOS? Carbohidratos Los carbohidratos afectan el nivel de glucosa en sangre ms que cualquier otro tipo de alimento. El nutricionista lo ayudar a determinar cuntos carbohidratos puede consumir en cada comida y ensearle a contarlos. El recuento de carbohidratos es importante para mantener la glucosa en sangre en un nivel saludable, en especial si utiliza insulina o toma determinados medicamentos para la diabetes mellitus. Alcohol El alcohol puede provocar disminuciones sbitas de la glucosa en sangre (hipoglucemia), en especial si utiliza insulina o toma determinados medicamentos para la diabetes mellitus. La hipoglucemia es una afeccin que puede poner en peligro la vida. Los sntomas de la hipoglucemia (somnolencia, mareos y desorientacin) son similares a los sntomas de haber consumido mucho alcohol.  Si el mdico lo autoriza a beber alcohol, hgalo con moderacin y siga estas pautas:  Las mujeres no deben beber ms de un trago por da, y los hombres no deben beber ms de dos tragos por da. Un trago es igual a:  12 onzas (355 ml) de cerveza  5 onzas de vino (150 ml) de vino  1,5onzas (45ml) de bebidas espirituosas  No beba con el estmago vaco.  Mantngase hidratado. Beba agua, gaseosas dietticas o t helado sin azcar.  Las gaseosas comunes, los jugos y  otros refrescos podran contener muchos carbohidratos y se deben contar. QU ALIMENTOS NO SE RECOMIENDAN? Cuando haga las elecciones de alimentos, es importante que recuerde que todos los alimentos son distintos. Algunos tienen menos nutrientes que otros por porcin, aunque podran tener la misma cantidad de caloras o carbohidratos. Es difcil darle al cuerpo lo que necesita cuando consume alimentos con menos nutrientes. Estos son algunos ejemplos de alimentos que debera evitar ya que contienen muchas caloras y carbohidratos, pero pocos nutrientes:  Grasas trans (la mayora de los alimentos procesados incluyen grasas trans en la etiqueta de Informacin nutricional).  Gaseosas comunes.  Jugos.  Caramelos.  Dulces, como tortas, pasteles, rosquillas y galletas.  Comidas fritas. QU ALIMENTOS PUEDO COMER? Consuma alimentos ricos en nutrientes, que nutrirn el cuerpo y lo mantendrn saludable. Los alimentos que debe comer tambin dependern de varios factores, como:  Las caloras que necesita.  Los medicamentos que toma.  Su peso.  El nivel de glucosa en sangre.  El nivel de presin arterial.  El nivel de colesterol. Tambin debe consumir una variedad de alimentos, como:  Protenas, como carne, aves, pescado, tofu, frutos secos y semillas (las protenas de animales magros son mejores).  Frutas.  Verduras.  Productos lcteos, como leche, queso y yogur (descremados son mejores).  Panes, granos, pastas, cereales, arroz y frijoles.  Grasas, como aceite de oliva, margarina sin grasas trans, aceite de canola, aguacate y aceitunas. TODOS LOS QUE PADECEN DIABETES MELLITUS TIENEN EL MISMO PLAN DE COMIDAS? Dado que todas las personas que padecen diabetes mellitus son distintas, no hay un solo plan de comidas que funcione para todos. Es muy   importante que se rena con un nutricionista que lo ayudar a crear un plan de comidas adecuado para usted. Document Released: 11/18/2007  Document Revised: 08/16/2013 ExitCare Patient Information 2015 [employer]. This information is not intended to replace advice given to you by your health care provider. Make sure you discuss any questions you have with your health care provider.  

## 2014-03-29 NOTE — Progress Notes (Signed)
Patient here for follow up on her diabetes Complains of feeling more depressed Would like to discuss menopause Has been having some blood mixed mixed in her Mucous when she clears her throat

## 2014-03-29 NOTE — Progress Notes (Signed)
MRN: [id number] Name: Nicole Villanueva  Sex: female Age: 48 y.o. DOB: 01/27/1966  Allergies: Review of patient's allergies indicates no known allergies.  Chief Complaint  Patient presents with  . Follow-up    dm    HPI: Patient is 48 y.o. female who has history of diabetes hyperlipidemia comes today for followup, hemoglobin A1c is trending up patient is taking Lantus 20 units as well as metformin thousand milligram twice a day, she denies any hypoglycemic symptoms, she is postmenopausal and complaining of lot of hot flushes and also has some depressive symptoms denies any SI or HI  , as per patient she was prescribed Paxil in the past which she did not get prescription for her because it was more expensive, she also has lot of reflux symptoms which sometimes goes up in her throat denies any other URI symptoms occasionally she has noticed some blood which she thinks comes from the throat, she denies smoking cigarettes denies any fever chills chest and shortness of breath. As per patient she was prescribed her Protonix in the past which helped her with the symptoms.  Past Medical History  Diagnosis Date  . Hypertension   . Diabetes mellitus   . Hyperlipidemia     Past Surgical History  Procedure Laterality Date  . Cholecystectomy        Medication List       This list is accurate as of: 03/29/14  3:39 PM.  Always use your most recent med list.               atorvastatin 40 MG tablet  Commonly known as:  LIPITOR  Take 1 tablet (40 mg total) by mouth daily.     glucose blood test strip  Commonly known as:  FREESTYLE TEST STRIPS  Use as instructed     glyBURIDE 5 MG tablet  Commonly known as:  DIABETA  Take 1 tablet (5 mg total) by mouth 2 (two) times daily with a meal.     Insulin Glargine 100 UNIT/ML Solostar Pen  Commonly known as:  LANTUS SOLOSTAR  Inject 20 units into skin every night.     Lancet Device Misc  Check blood sugar 3x per day     metFORMIN  1000 MG tablet  Commonly known as:  GLUCOPHAGE  Take 1 tablet (1,000 mg total) by mouth 2 (two) times daily with a meal. Needs office visit and labs for further refills of all medications     pantoprazole 40 MG tablet  Commonly known as:  PROTONIX  Take 1 tablet (40 mg total) by mouth daily.     sertraline 50 MG tablet  Commonly known as:  ZOLOFT  Take 1 tablet (50 mg total) by mouth daily.        Meds ordered this encounter  Medications  . insulin aspart (novoLOG) injection 20 Units    Sig:   . sertraline (ZOLOFT) 50 MG tablet    Sig: Take 1 tablet (50 mg total) by mouth daily.    Dispense:  30 tablet    Refill:  3  . pantoprazole (PROTONIX) 40 MG tablet    Sig: Take 1 tablet (40 mg total) by mouth daily.    Dispense:  30 tablet    Refill:  3  . atorvastatin (LIPITOR) 40 MG tablet    Sig: Take 1 tablet (40 mg total) by mouth daily.    Dispense:  90 tablet    Refill:  3  . glyBURIDE (  DIABETA) 5 MG tablet    Sig: Take 1 tablet (5 mg total) by mouth 2 (two) times daily with a meal.    Dispense:  60 tablet    Refill:  3     There is no immunization history on file for this patient.  Family History  Problem Relation Age of Onset  . Diabetes Mother   . Diabetes Father     History  Substance Use Topics  . Smoking status: Never Smoker   . Smokeless tobacco: Not on file  . Alcohol Use: No    Review of Systems   As noted in HPI  Filed Vitals:   03/29/14 1505  BP: 124/79  Pulse: 87  Temp: 99.3 F (37.4 C)  Resp: 16    Physical Exam  Physical Exam  Constitutional: No distress.  Eyes: EOM are normal. Pupils are equal, round, and reactive to light.  Cardiovascular: Normal rate and regular rhythm.   Pulmonary/Chest: Breath sounds normal. No respiratory distress. She has no wheezes. She has no rales.  Abdominal: Soft. There is no tenderness. There is no rebound and no guarding.  Musculoskeletal: She exhibits no edema.    CBC    Component Value  Date/Time   WBC 8.0 01/12/2014 0907   WBC 11.3* 12/21/2013 2101   RBC 4.56 01/12/2014 0907   RBC 4.29 12/21/2013 2101   HGB 13.5 01/12/2014 0907   HGB 12.6 12/21/2013 2101   HCT 39.6 01/12/2014 0907   HCT 39.0 12/21/2013 2101   PLT 345 01/12/2014 0907   MCV 86.8 01/12/2014 0907   MCV 90.9 12/21/2013 2101   LYMPHSABS 2.8 06/16/2007 0930   MONOABS 0.4 06/16/2007 0930   EOSABS 0.0 06/16/2007 0930   BASOSABS 0.0 06/16/2007 0930    CMP     Component Value Date/Time   NA 135 01/12/2014 0907   K 4.7 01/12/2014 0907   CL 101 01/12/2014 0907   CO2 27 01/12/2014 0907   GLUCOSE 222* 01/12/2014 0907   BUN 12 01/12/2014 0907   CREATININE 0.46* 01/12/2014 0907   CREATININE 0.42 06/16/2007 0930   CALCIUM 9.0 01/12/2014 0907   PROT 6.8 01/12/2014 0907   ALBUMIN 3.7 01/12/2014 0907   AST 20 01/12/2014 0907   ALT 24 01/12/2014 0907   ALKPHOS 93 01/12/2014 0907   BILITOT 0.5 01/12/2014 0907   GFRNONAA >89 01/12/2014 0907   GFRNONAA >60 06/16/2007 0930   GFRAA >89 01/12/2014 0907   GFRAA  Value: >60        The eGFR has been calculated using the MDRD equation. This calculation has not been validated in all clinical 06/16/2007 0930    Lab Results  Component Value Date/Time   CHOL 210* 01/12/2014  9:07 AM    No components found with this basename: hga1c    Lab Results  Component Value Date/Time   AST 20 01/12/2014  9:07 AM    Assessment and Plan  Diabetes mellitus due to underlying condition without complications - Plan:  Results for orders placed in visit on 03/29/14  GLUCOSE, POCT (MANUAL RESULT ENTRY)      Result Value Ref Range   POC Glucose 381 (*) 70 - 99 mg/dl  POCT GLYCOSYLATED HEMOGLOBIN (HGB A1C)      Result Value Ref Range   Hemoglobin A1C 11.2     And her A1c is trending up , she was given insulin aspart (novoLOG) injection 20 Units, , I have advised patient to increase the dose of Lantus to 25  units, continue with metformin 1 g twice a day, I have started patient on glyBURIDE (DIABETA) 5  MG tablet to take twice a day, advise patient to keep the fingerstick log. Will repeat A1c in 3 months.  Other and unspecified hyperlipidemia - Plan: Prescribed her atorvastatin (LIPITOR) 40 MG tablet, will do fasting lipid panel on the next visit  Depressive disorder, not elsewhere classified /Hot flashes - Plan: Started patient on sertraline (ZOLOFT) 50 MG tablet  Patient denies any SI or HI  Gastroesophageal reflux disease, esophagitis presence not specified - Plan: Started patient on pantoprazole (PROTONIX) 40 MG tablet  Screening - Plan: Ambulatory referral to Gynecology   Health Maintenance  -Pap Smear: referred to GYN  Return in about 3 months (around 06/29/2014) for diabetes, depression.  Lorayne Marek, MD

## 2014-06-02 ENCOUNTER — Other Ambulatory Visit: Payer: Self-pay | Admitting: Internal Medicine

## 2014-06-02 DIAGNOSIS — E119 Type 2 diabetes mellitus without complications: Secondary | ICD-10-CM

## 2014-06-02 MED ORDER — INSULIN GLARGINE 100 UNIT/ML SOLOSTAR PEN: PEN_INJECTOR | SUBCUTANEOUS | Status: DC

## 2014-06-02 MED ORDER — METFORMIN HCL 1000 MG PO TABS: 1000.0000 mg | ORAL_TABLET | Freq: Two times a day (BID) | ORAL | Status: DC

## 2014-06-07 ENCOUNTER — Encounter: Payer: Self-pay | Admitting: Obstetrics & Gynecology

## 2014-06-07 ENCOUNTER — Ambulatory Visit (INDEPENDENT_AMBULATORY_CARE_PROVIDER_SITE_OTHER): Payer: 59 | Admitting: Obstetrics & Gynecology

## 2014-06-07 VITALS — Temp 98.7°F | Ht 61.0 in | Wt 152.0 lb

## 2014-06-07 DIAGNOSIS — Z23 Encounter for immunization: Secondary | ICD-10-CM

## 2014-06-07 DIAGNOSIS — N812 Incomplete uterovaginal prolapse: Secondary | ICD-10-CM

## 2014-06-07 DIAGNOSIS — Z01419 Encounter for gynecological examination (general) (routine) without abnormal findings: Secondary | ICD-10-CM

## 2014-06-07 DIAGNOSIS — N819 Female genital prolapse, unspecified: Secondary | ICD-10-CM

## 2014-06-07 NOTE — Patient Instructions (Addendum)
Kegel Exercises The goal of Kegel exercises is to isolate and exercise your pelvic floor muscles. These muscles act as a hammock that supports the rectum, vagina, small intestine, and uterus. As the muscles weaken, the hammock sags and these organs are displaced from their normal positions. Kegel exercises can strengthen your pelvic floor muscles and help you to improve bladder and bowel control, improve sexual response, and help reduce many problems and some discomfort during pregnancy. Kegel exercises can be done anywhere and at any time. HOW TO PERFORM KEGEL EXERCISES 1. Locate your pelvic floor muscles. To do this, squeeze (contract) the muscles that you use when you try to stop the flow of urine. You will feel a tightness in the vaginal area (women) and a tight lift in the rectal area (men and women). 2. When you begin, contract your pelvic muscles tight for 2-5 seconds, then relax them for 2-5 seconds. This is one set. Do 4-5 sets with a short pause in between. 3. Contract your pelvic muscles for 8-10 seconds, then relax them for 8-10 seconds. Do 4-5 sets. If you cannot contract your pelvic muscles for 8-10 seconds, try 5-7 seconds and work your way up to 8-10 seconds. Your goal is 4-5 sets of 10 contractions each day. Keep your stomach, buttocks, and legs relaxed during the exercises. Perform sets of both short and long contractions. Vary your positions. Perform these contractions 3-4 times per day. Perform sets while you are:   Lying in bed in the morning.  Standing at lunch.  Sitting in the late afternoon.  Lying in bed at night. You should do 40-50 contractions per day. Do not perform more Kegel exercises per day than recommended. Overexercising can cause muscle fatigue. Continue these exercises for for at least 15-20 weeks or as directed by your caregiver. Document Released: 07/28/2012 Document Reviewed: 07/28/2012 ExitCare Patient Information 2015 [employer]. This information is  not intended to replace advice given to you by your health care provider. Make sure you discuss any questions you have with your health care provider. Kegel Exercises The goal of Kegel exercises is to isolate and exercise your pelvic floor muscles. These muscles act as a hammock that supports the rectum, vagina, small intestine, and uterus. As the muscles weaken, the hammock sags and these organs are displaced from their normal positions. Kegel exercises can strengthen your pelvic floor muscles and help you to improve bladder and bowel control, improve sexual response, and help reduce many problems and some discomfort during pregnancy. Kegel exercises can be done anywhere and at any time. HOW TO PERFORM KEGEL EXERCISES 4. Locate your pelvic floor muscles. To do this, squeeze (contract) the muscles that you use when you try to stop the flow of urine. You will feel a tightness in the vaginal area (women) and a tight lift in the rectal area (men and women). 5. When you begin, contract your pelvic muscles tight for 2-5 seconds, then relax them for 2-5 seconds. This is one set. Do 4-5 sets with a short pause in between. 6. Contract your pelvic muscles for 8-10 seconds, then relax them for 8-10 seconds. Do 4-5 sets. If you cannot contract your pelvic muscles for 8-10 seconds, try 5-7 seconds and work your way up to 8-10 seconds. Your goal is 4-5 sets of 10 contractions each day. Keep your stomach, buttocks, and legs relaxed during the exercises. Perform sets of both short and long contractions. Vary your positions. Perform these contractions 3-4 times per day. Perform sets   while you are:   Lying in bed in the morning.  Standing at lunch.  Sitting in the late afternoon.  Lying in bed at night. You should do 40-50 contractions per day. Do not perform more Kegel exercises per day than recommended. Overexercising can cause muscle fatigue. Continue these exercises for for at least 15-20 weeks or as directed  by your caregiver. Document Released: 07/28/2012 Document Reviewed: 07/28/2012 Exit[employer] Patient Information 2015 [employer]. This information is not intended to replace advice given to you by your health care provider. Make sure you discuss any questions you have with your health care provider.  

## 2014-06-07 NOTE — Progress Notes (Signed)
Subjective:     Nicole Villanueva is a 48 y.o. female here for a routine exam and pap smear    Personal health questionnaire:  Is patient Ashkenazi Jewish, have a family history of breast and/or ovarian cancer: no Is there a family history of uterine cancer diagnosed at age < 6050, gastrointestinal cancer, urinary tract cancer, family member who is a Personnel officerLynch syndrome-associated carrier: no Is the patient overweight and hypertensive, family history of diabetes, personal history of gestational diabetes or PCOS: yes Is patient over 6955, have PCOS,  family history of premature CHD under age 47, diabetes, smoke, have hypertension or peripheral artery disease:  yes At any time, has a partner hit, kicked or otherwise hurt or frightened you?: no Over the past 2 weeks, have you felt down, depressed or hopeless?: no Over the past 2 weeks, have you felt little interest or pleasure in doing things?:sometimes   Gynecologic History Patient's last menstrual period was 09/29/2011. Contraception: abstinence Last Pap results were: normal Last mammogram: 8/14. Results were: normal  Obstetric History OB History  Gravida Para Term Preterm AB SAB TAB Ectopic Multiple Living  2 2 2  0 0 0 0 0 0 2    # Outcome Date GA Lbr Len/2nd Weight Sex Delivery Anes PTL Lv  2 TRM      SVD EPI  Y  1 TRM      SVD EPI  Y      Past Medical History  Diagnosis Date  . Hypertension   . Diabetes mellitus   . Hyperlipidemia     Past Surgical History  Procedure Laterality Date  . Cholecystectomy      Current outpatient prescriptions:atorvastatin (LIPITOR) 40 MG tablet, Take 1 tablet (40 mg total) by mouth daily., Disp: 90 tablet, Rfl: 3;  glucose blood (FREESTYLE TEST STRIPS) test strip, Use as instructed, Disp: 100 each, Rfl: 12;  glyBURIDE (DIABETA) 5 MG tablet, Take 1 tablet (5 mg total) by mouth 2 (two) times daily with a meal., Disp: 60 tablet, Rfl: 3 Insulin Glargine (LANTUS SOLOSTAR) 100 UNIT/ML Solostar Pen,  Inject 25 units into skin every night., Disp: 15 mL, Rfl: 5;  Lancet Device MISC, Check blood sugar 3x per day, Disp: 90 each, Rfl: 5;  metFORMIN (GLUCOPHAGE) 1000 MG tablet, Take 1 tablet (1,000 mg total) by mouth 2 (two) times daily with a meal., Disp: 60 tablet, Rfl: 4;  pantoprazole (PROTONIX) 40 MG tablet, Take 1 tablet (40 mg total) by mouth daily., Disp: 30 tablet, Rfl: 3 sertraline (ZOLOFT) 50 MG tablet, Take 1 tablet (50 mg total) by mouth daily., Disp: 30 tablet, Rfl: 3 No Known Allergies  History  Substance Use Topics  . Smoking status: Never Smoker   . Smokeless tobacco: Never Used  . Alcohol Use: No    Family History  Problem Relation Age of Onset  . Diabetes Mother   . Diabetes Father       Review of Systems  Constitutional: negative for fatigue and weight loss Respiratory: negative for cough and wheezing Cardiovascular: negative for chest pain, fatigue and palpitations Gastrointestinal: negative for abdominal pain and change in bowel habits Musculoskeletal:negative for myalgias Neurological: negative for gait problems and tremors Behavioral/Psych: negative for abusive relationship, depression Endocrine: negative for temperature intolerance   Genitourinary:negative for abnormal menstrual periods, genital lesions, hot flashes, sexual problems and vaginal discharge Integument/breast: negative for breast lump, breast tenderness, nipple discharge and skin lesion(s)    Objective:       Temp(Src) 98.7  F (37.1 C)  Ht 5\' 1"  (1.549 m)  Wt 68.947 kg (152 lb)  BMI 28.74 kg/m2  LMP 09/29/2011 General:   alert, oriented x 3   Skin:   no rash or abnormalities  Lungs:   clear to auscultation bilaterally  Heart:   regular rate and rhythm, S1, S2 normal, no murmur, click, rub or gallop  Breasts:   normal without suspicious masses, skin or nipple changes or axillary nodes.  Denies any breast problems   Abdomen:  normal findings: no organomegaly, soft, non-tender and no hernia   Pelvis:  External genitalia: normal general appearance Urinary system: urethral meatus normal and bladder without fullness, nontender Vaginal: normal without tenderness, induration or masses Cervix: normal appearance Adnexa: normal bimanual exam Uterus: anteverted and non-tender, normal size Poor pelvic floor tone with contraction Rectovaginal exam performed   :  POP-Q:   Aa: -3 Ba: -3 C: -2  D: not  measured Ap: -1 Bp: -1      Lab Review  Labs reviewed no Radiologic studies reviewed yes    Assessment:   Uterine prolapse--cervical elongation, rectocele Continent Chronic constipation  Plan:  Counseled re: Kegel's exercises Referral-->Urogynecology/PT * Annual exam and pap smear today * Flu vaccine * STD testing * Encourage to schedule mammogram within the next month for annual screening/evaluation.  * Encouraged to take multivitamin  * Encouraged diet-modification to decrease weight gain, and improve lifestyle changes with healthier eating. Encouraged to avoid high-fat foods and drink more water to promote bowel motility.  * Encouraged exercise of walking daily to aid with weight loss.       * Counseled re: Leatrice JewelsLuvena to help with vaginal dryness.  Return as needed  Orders Placed This Encounter  Procedures  . Flu Vaccine QUAD 36+ mos IM (Fluarix)  . Ambulatory referral to Physical Therapy    Referral Priority:  Routine    Referral Type:  Physical Medicine    Referral Reason:  Specialty Services Required    Requested Specialty:  Physical Therapy    Number of Visits Requested:  1  . Ambulatory referral to Urology    Referral Priority:  Routine    Referral Type:  Consultation    Referral Reason:  Specialty Services Required    Requested Specialty:  Urology    Number of Visits Requested:  1

## 2014-06-08 LAB — PAP IG AND HPV HIGH-RISK: HPV DNA High Risk: NOT DETECTED

## 2014-06-09 ENCOUNTER — Telehealth: Payer: Self-pay

## 2014-06-09 DIAGNOSIS — N819 Female genital prolapse, unspecified: Secondary | ICD-10-CM | POA: Insufficient documentation

## 2014-06-09 NOTE — Telephone Encounter (Signed)
[facility] and [facility] both called patient for referral appts - they both left a message for her to call about appt,  and  I also called her and gave her the phone numbers of both facilities - she said she would call them today.

## 2014-06-12 ENCOUNTER — Telehealth: Payer: Self-pay

## 2014-06-12 NOTE — Telephone Encounter (Signed)
PATIENT HAS APPT WITH [facility] ON 06/15/14 AT 9AM - PHYSICAL THERAPY APPT WITH [facility] HAS NOT BEEN MADE YET - THEY'VE CALLED TWICE AND I CALLED HER TODAY AND TOLD HER TO CALL THEM AT [phone]

## 2014-06-19 ENCOUNTER — Ambulatory Visit: Payer: 59 | Attending: Internal Medicine

## 2014-06-19 NOTE — Progress Notes (Unsigned)
Pt here for CBG check after being started on Glyburide 5 mg tab with increased Lantus to 25 units at bedtime Pt is compliant with taking medications daily Pt didn't bring CBG log today but states blood sugars have been running in 200's Denies blurry vision,dizziness or headache [id number]-166

## 2014-06-19 NOTE — Patient Instructions (Addendum)
La diabetes mellitus y los alimentos (Diabetes Mellitus and Food) Es importante que controle su nivel de azcar en la sangre (glucosa). El nivel de glucosa en sangre depende en gran medida de lo que usted come. Comer alimentos saludables en las cantidades [place] a lo largo del Training and development officer, aproximadamente a la misma hora [employer], lo ayudar a Chief Technology Officer su nivel de Multimedia programmer. Tambin puede ayudarlo a retrasar o Patent attorney de la diabetes mellitus. Comer de [employer] saludable incluso puede ayudarlo a Chartered loss adjuster de presin arterial y a Science writer o Theatre manager un peso saludable.  CMO PUEDEN AFECTARME LOS ALIMENTOS? Carbohidratos Los carbohidratos afectan el nivel de glucosa en sangre ms que cualquier otro tipo de alimento. El nutricionista lo ayudar a Teacher, adult education cuntos carbohidratos puede consumir en cada comida y ensearle a contarlos. El recuento de carbohidratos es importante para mantener la glucosa en sangre en un nivel saludable, en especial si utiliza insulina o toma determinados medicamentos para la diabetes mellitus. Alcohol El alcohol puede provocar disminuciones sbitas de la glucosa en sangre (hipoglucemia), en especial si utiliza insulina o toma determinados medicamentos para la diabetes mellitus. La hipoglucemia es una afeccin que puede poner en peligro la vida. Los sntomas de la hipoglucemia (somnolencia, mareos y Data processing manager) son similares a los sntomas de haber consumido mucho alcohol.  Si el mdico lo autoriza a beber alcohol, hgalo con moderacin y siga estas pautas:  Las mujeres no deben beber ms de un trago por da, y los hombres no deben beber ms de dos tragos por Training and development officer. Un trago es igual a:  12 onzas (355 ml) de cerveza  5 onzas de vino (150 ml) de vino  1,5onzas (35ml) de bebidas espirituosas  No beba con el estmago vaco.  Mantngase hidratado. Beba agua, gaseosas dietticas o t helado sin azcar.  Las gaseosas comunes, los jugos y  otros refrescos podran contener muchos carbohidratos y se Civil Service fast streamer. QU ALIMENTOS NO SE RECOMIENDAN? Cuando haga las elecciones de alimentos, es importante que recuerde que todos los alimentos son distintos. Algunos tienen menos nutrientes que otros por porcin, aunque podran tener la misma cantidad de caloras o carbohidratos. Es difcil darle al cuerpo lo que necesita cuando consume alimentos con menos nutrientes. Estos son algunos ejemplos de alimentos que debera evitar ya que contienen muchas caloras y carbohidratos, pero pocos nutrientes:  Physicist, medical trans (la mayora de los alimentos procesados incluyen grasas trans en la etiqueta de Informacin nutricional).  Gaseosas comunes.  Jugos.  Caramelos.  Dulces, como tortas, pasteles, rosquillas y [place].  Comidas fritas. QU ALIMENTOS PUEDO COMER? Consuma alimentos ricos en nutrientes, que nutrirn el cuerpo y lo mantendrn saludable. Los alimentos que debe comer tambin dependern de varios factores, como:  Las caloras que necesita.  Los medicamentos que toma.  Su peso.  El nivel de glucosa en [place].  El [place] de presin arterial.  El nivel de colesterol. Tambin debe consumir una variedad de [place], como:  Protenas, como carne, aves, pescado, tofu, frutos secos y semillas (las protenas de [place] magros son mejores).  Lambert Mody.  Verduras.  Productos lcteos, como [place], queso y yogur (descremados son mejores).  Panes, granos, pastas, cereales, arroz y frijoles.  Grasas, como aceite de [place], [place] sin grasas trans, aceite de canola, aguacate y [place]. TODOS LOS QUE PADECEN DIABETES MELLITUS TIENEN EL [place] PLAN DE [place]? Dado que todas las personas que padecen diabetes mellitus son distintas, no hay un solo plan de comidas que funcione para todos. Es muy  importante que se rena con un nutricionista que lo ayudar a crear un plan de comidas adecuado para usted. Document Released: 11/18/2007  Document Revised: 08/16/2013 [facility] Patient Information 2015 [place], [place]. This information is not intended to replace advice given to you by your health care provider. Make sure you discuss any questions you have with your health care provider.   Pt instructed to continue taking prescribed medication and schedule 3 mnth f/u appointment for A1c,Bloodwork

## 2014-06-20 ENCOUNTER — Ambulatory Visit: Payer: 59 | Attending: Obstetrics & Gynecology | Admitting: Physical Therapy

## 2014-06-20 DIAGNOSIS — E119 Type 2 diabetes mellitus without complications: Secondary | ICD-10-CM | POA: Diagnosis not present

## 2014-06-20 DIAGNOSIS — N812 Incomplete uterovaginal prolapse: Secondary | ICD-10-CM | POA: Diagnosis not present

## 2014-06-20 DIAGNOSIS — R3916 Straining to void: Secondary | ICD-10-CM | POA: Insufficient documentation

## 2014-06-20 DIAGNOSIS — Z5189 Encounter for other specified aftercare: Secondary | ICD-10-CM | POA: Insufficient documentation

## 2014-06-20 DIAGNOSIS — I10 Essential (primary) hypertension: Secondary | ICD-10-CM | POA: Diagnosis not present

## 2014-06-20 DIAGNOSIS — N8184 Pelvic muscle wasting: Secondary | ICD-10-CM | POA: Diagnosis not present

## 2014-06-26 ENCOUNTER — Encounter: Payer: Self-pay | Admitting: Obstetrics & Gynecology

## 2014-07-05 ENCOUNTER — Ambulatory Visit: Payer: 59 | Attending: Obstetrics & Gynecology | Admitting: Physical Therapy

## 2014-07-05 DIAGNOSIS — I10 Essential (primary) hypertension: Secondary | ICD-10-CM | POA: Diagnosis not present

## 2014-07-05 DIAGNOSIS — N8184 Pelvic muscle wasting: Secondary | ICD-10-CM | POA: Insufficient documentation

## 2014-07-05 DIAGNOSIS — E119 Type 2 diabetes mellitus without complications: Secondary | ICD-10-CM | POA: Diagnosis not present

## 2014-07-05 DIAGNOSIS — Z5189 Encounter for other specified aftercare: Secondary | ICD-10-CM | POA: Insufficient documentation

## 2014-07-05 DIAGNOSIS — R3916 Straining to void: Secondary | ICD-10-CM | POA: Insufficient documentation

## 2014-07-05 DIAGNOSIS — N812 Incomplete uterovaginal prolapse: Secondary | ICD-10-CM | POA: Diagnosis not present

## 2014-07-28 ENCOUNTER — Ambulatory Visit: Payer: 59 | Admitting: Internal Medicine

## 2014-07-31 ENCOUNTER — Ambulatory Visit: Payer: 59 | Admitting: Internal Medicine

## 2014-08-07 ENCOUNTER — Encounter: Payer: Self-pay | Admitting: Internal Medicine

## 2014-08-07 ENCOUNTER — Ambulatory Visit: Payer: 59 | Attending: Internal Medicine | Admitting: Internal Medicine

## 2014-08-07 VITALS — BP 105/62 | HR 75 | Temp 98.0°F | Resp 16

## 2014-08-07 DIAGNOSIS — E7800 Pure hypercholesterolemia, unspecified: Secondary | ICD-10-CM

## 2014-08-07 DIAGNOSIS — Z794 Long term (current) use of insulin: Secondary | ICD-10-CM | POA: Diagnosis not present

## 2014-08-07 DIAGNOSIS — K219 Gastro-esophageal reflux disease without esophagitis: Secondary | ICD-10-CM

## 2014-08-07 DIAGNOSIS — E1165 Type 2 diabetes mellitus with hyperglycemia: Secondary | ICD-10-CM | POA: Diagnosis not present

## 2014-08-07 DIAGNOSIS — Z79899 Other long term (current) drug therapy: Secondary | ICD-10-CM | POA: Insufficient documentation

## 2014-08-07 DIAGNOSIS — E139 Other specified diabetes mellitus without complications: Secondary | ICD-10-CM

## 2014-08-07 DIAGNOSIS — Z1231 Encounter for screening mammogram for malignant neoplasm of breast: Secondary | ICD-10-CM

## 2014-08-07 DIAGNOSIS — Z9049 Acquired absence of other specified parts of digestive tract: Secondary | ICD-10-CM | POA: Insufficient documentation

## 2014-08-07 LAB — COMPLETE METABOLIC PANEL WITH GFR
ALT: 24 U/L (ref 0–35)
AST: 19 U/L (ref 0–37)
Albumin: 4.1 g/dL (ref 3.5–5.2)
Alkaline Phosphatase: 111 U/L (ref 39–117)
BUN: 12 mg/dL (ref 6–23)
CO2: 28 mEq/L (ref 19–32)
Calcium: 9.5 mg/dL (ref 8.4–10.5)
Chloride: 103 mEq/L (ref 96–112)
Creat: 0.46 mg/dL — ABNORMAL LOW (ref 0.50–1.10)
GFR, Est African American: >89 mL/min
GFR, Est Non African American: >89 mL/min
Glucose, Bld: 164 mg/dL — ABNORMAL HIGH (ref 70–99)
Potassium: 4.8 mEq/L (ref 3.5–5.3)
Sodium: 138 mEq/L (ref 135–145)
Total Bilirubin: 0.3 mg/dL (ref 0.2–1.2)
Total Protein: 7.2 g/dL (ref 6.0–8.3)

## 2014-08-07 LAB — GLUCOSE, POCT (MANUAL RESULT ENTRY): POC Glucose: 211 mg/dL — AB (ref 70–99)

## 2014-08-07 LAB — LIPID PANEL
Cholesterol: 150 mg/dL (ref 0–200)
HDL: 43 mg/dL (ref >39)
LDL Cholesterol: 69 mg/dL (ref 0–99)
Total CHOL/HDL Ratio: 3.5 Ratio
Triglycerides: 191 mg/dL — ABNORMAL HIGH (ref <150)
VLDL: 38 mg/dL (ref 0–40)

## 2014-08-07 LAB — POCT GLYCOSYLATED HEMOGLOBIN (HGB A1C): Hemoglobin A1C: 8.9

## 2014-08-07 MED ORDER — SITAGLIPTIN PHOS-METFORMIN HCL 50-1000 MG PO TABS: 1.0000 | ORAL_TABLET | Freq: Two times a day (BID) | ORAL | Status: DC

## 2014-08-07 MED ORDER — GLYBURIDE 5 MG PO TABS: 5.0000 mg | ORAL_TABLET | Freq: Two times a day (BID) | ORAL | Status: DC

## 2014-08-07 MED ORDER — PANTOPRAZOLE SODIUM 40 MG PO TBEC
40.0000 mg | DELAYED_RELEASE_TABLET | Freq: Every day | ORAL | Status: DC
Start: 2014-08-07 — End: 2015-06-22

## 2014-08-07 MED ORDER — ATORVASTATIN CALCIUM 40 MG PO TABS: 40.0000 mg | ORAL_TABLET | Freq: Every day | ORAL | Status: DC

## 2014-08-07 MED ORDER — INSULIN GLARGINE 100 UNIT/ML SOLOSTAR PEN: 35.0000 [IU] | PEN_INJECTOR | Freq: Every day | SUBCUTANEOUS | Status: DC

## 2014-08-07 NOTE — Patient Instructions (Signed)
Diabetes Mellitus and Food It is important for you to manage your blood sugar (glucose) level. Your blood glucose level can be greatly affected by what you eat. Eating healthier foods in the appropriate amounts throughout the day at about the same time each day will help you control your blood glucose level. It can also help slow or prevent worsening of your diabetes mellitus. Healthy eating may even help you improve the level of your blood pressure and reach or maintain a healthy weight.  HOW CAN FOOD AFFECT ME? Carbohydrates Carbohydrates affect your blood glucose level more than any other type of food. Your dietitian will help you determine how many carbohydrates to eat at each meal and teach you how to count carbohydrates. Counting carbohydrates is important to keep your blood glucose at a healthy level, especially if you are using insulin or taking certain medicines for diabetes mellitus. Alcohol Alcohol can cause sudden decreases in blood glucose (hypoglycemia), especially if you use insulin or take certain medicines for diabetes mellitus. Hypoglycemia can be a life-threatening condition. Symptoms of hypoglycemia (sleepiness, dizziness, and disorientation) are similar to symptoms of having too much alcohol.  If your health care provider has given you approval to drink alcohol, do so in moderation and use the following guidelines:  Women should not have more than one drink per day, and men should not have more than two drinks per day. One drink is equal to:  12 oz of beer.  5 oz of wine.  1 oz of hard liquor.  Do not drink on an empty stomach.  Keep yourself hydrated. Have water, diet soda, or unsweetened iced tea.  Regular soda, juice, and other mixers might contain a lot of carbohydrates and should be counted. WHAT FOODS ARE NOT RECOMMENDED? As you make food choices, it is important to remember that all foods are not the same. Some foods have fewer nutrients per serving than other  foods, even though they might have the same number of calories or carbohydrates. It is difficult to get your body what it needs when you eat foods with fewer nutrients. Examples of foods that you should avoid that are high in calories and carbohydrates but low in nutrients include:  Trans fats (most processed foods list trans fats on the Nutrition Facts label).  Regular soda.  Juice.  Candy.  Sweets, such as cake, pie, doughnuts, and cookies.  Fried foods. WHAT FOODS CAN I EAT? Have nutrient-rich foods, which will nourish your body and keep you healthy. The food you should eat also will depend on several factors, including:  The calories you need.  The medicines you take.  Your weight.  Your blood glucose level.  Your blood pressure level.  Your cholesterol level. You also should eat a variety of foods, including:  Protein, such as meat, poultry, fish, tofu, nuts, and seeds (lean animal proteins are best).  Fruits.  Vegetables.  Dairy products, such as milk, cheese, and yogurt (low fat is best).  Breads, grains, pasta, cereal, rice, and beans.  Fats such as olive oil, trans fat-free margarine, canola oil, avocado, and olives. DOES EVERYONE WITH DIABETES MELLITUS HAVE THE SAME MEAL PLAN? Because every person with diabetes mellitus is different, there is not one meal plan that works for everyone. It is very important that you meet with a dietitian who will help you create a meal plan that is just right for you. Document Released: 05/08/2005 Document Revised: 08/16/2013 Document Reviewed: 07/08/2013 ExitCare Patient Information 2015 ExitCare, LLC. This   information is not intended to replace advice given to you by your health care provider. Make sure you discuss any questions you have with your health care provider.  

## 2014-08-07 NOTE — Progress Notes (Signed)
Patient here for follow up on her diabetes 

## 2014-08-07 NOTE — Progress Notes (Signed)
MRN: [id number] Name: Nicole Villanueva  Sex: female Age: 48 y.o. DOB: 1966/05/19  Allergies: Review of patient's allergies indicates no known allergies.  Chief Complaint  Patient presents with  . Follow-up    HPI: Patient is 48 y.o. female who has history of diabetes , hyperlipidemia, depression comes today for followup , she denies any hypoglycemic symptoms, her hemoglobin A1c has improved compared to last visit currently patient is taking metformin thousand milligram twice a day Lantus 35 units each bedtime, Glucotrol 5 mg twice a day, as per patient history her fasting sugars between 150-200 mg a deciliter, she also follows up with her gynecologist and was told she has uterine prolapse and is seen and the specialist and is considering to go for surgery next year, patient is requesting refill on her medications, she is also due for mammogram.  Past Medical History  Diagnosis Date  . Hypertension   . Diabetes mellitus   . Hyperlipidemia     Past Surgical History  Procedure Laterality Date  . Cholecystectomy        Medication List       This list is accurate as of: 08/07/14 10:25 AM.  Always use your most recent med list.               atorvastatin 40 MG tablet  Commonly known as:  LIPITOR  Take 1 tablet (40 mg total) by mouth daily.     glucose blood test strip  Commonly known as:  FREESTYLE TEST STRIPS  Use as instructed     glyBURIDE 5 MG tablet  Commonly known as:  DIABETA  Take 1 tablet (5 mg total) by mouth 2 (two) times daily with a meal.     Insulin Glargine 100 UNIT/ML Solostar Pen  Commonly known as:  LANTUS SOLOSTAR  Inject 35 Units into the skin daily at 10 pm. Inject 25 units into skin every night.     Lancet Device Misc  Check blood sugar 3x per day     metFORMIN 1000 MG tablet  Commonly known as:  GLUCOPHAGE  Take 1 tablet (1,000 mg total) by mouth 2 (two) times daily with a meal.     pantoprazole 40 MG tablet  Commonly known as:   PROTONIX  Take 1 tablet (40 mg total) by mouth daily.     sertraline 50 MG tablet  Commonly known as:  ZOLOFT  Take 1 tablet (50 mg total) by mouth daily.     sitaGLIPtin-metformin 50-1000 MG per tablet  Commonly known as:  JANUMET  Take 1 tablet by mouth 2 (two) times daily with a meal.        Meds ordered this encounter  Medications  . atorvastatin (LIPITOR) 40 MG tablet    Sig: Take 1 tablet (40 mg total) by mouth daily.    Dispense:  90 tablet    Refill:  3  . glyBURIDE (DIABETA) 5 MG tablet    Sig: Take 1 tablet (5 mg total) by mouth 2 (two) times daily with a meal.    Dispense:  60 tablet    Refill:  3  . Insulin Glargine (LANTUS SOLOSTAR) 100 UNIT/ML Solostar Pen    Sig: Inject 35 Units into the skin daily at 10 pm. Inject 25 units into skin every night.    Dispense:  15 mL    Refill:  5  . pantoprazole (PROTONIX) 40 MG tablet    Sig: Take 1 tablet (40 mg total) by  mouth daily.    Dispense:  30 tablet    Refill:  3  . sitaGLIPtin-metformin (JANUMET) 50-1000 MG per tablet    Sig: Take 1 tablet by mouth 2 (two) times daily with a meal.    Dispense:  60 tablet    Refill:  3    Immunization History  Administered Date(s) Administered  . Influenza,inj,Quad PF,36+ Mos 06/07/2014    Family History  Problem Relation Age of Onset  . Diabetes Mother   . Diabetes Father     History  Substance Use Topics  . Smoking status: Never Smoker   . Smokeless tobacco: Never Used  . Alcohol Use: No    Review of Systems   As noted in HPI  Filed Vitals:   08/07/14 0934  BP: 105/62  Pulse: 75  Temp: 98 F (36.7 C)  Resp: 16    Physical Exam  Physical Exam  Constitutional: No distress.  Eyes: EOM are normal. Pupils are equal, round, and reactive to light.  Cardiovascular: Normal rate and regular rhythm.   Pulmonary/Chest: Breath sounds normal. No respiratory distress. She has no wheezes. She has no rales.  Abdominal: Soft. There is no tenderness. There is no  rebound.  Musculoskeletal: She exhibits no edema.    CBC    Component Value Date/Time   WBC 8.0 01/12/2014 0907   WBC 11.3* 12/21/2013 2101   RBC 4.56 01/12/2014 0907   RBC 4.29 12/21/2013 2101   HGB 13.5 01/12/2014 0907   HGB 12.6 12/21/2013 2101   HCT 39.6 01/12/2014 0907   HCT 39.0 12/21/2013 2101   PLT 345 01/12/2014 0907   MCV 86.8 01/12/2014 0907   MCV 90.9 12/21/2013 2101   LYMPHSABS 2.8 06/16/2007 0930   MONOABS 0.4 06/16/2007 0930   EOSABS 0.0 06/16/2007 0930   BASOSABS 0.0 06/16/2007 0930    CMP     Component Value Date/Time   NA 135 01/12/2014 0907   K 4.7 01/12/2014 0907   CL 101 01/12/2014 0907   CO2 27 01/12/2014 0907   GLUCOSE 222* 01/12/2014 0907   BUN 12 01/12/2014 0907   CREATININE 0.46* 01/12/2014 0907   CREATININE 0.42 06/16/2007 0930   CALCIUM 9.0 01/12/2014 0907   PROT 6.8 01/12/2014 0907   ALBUMIN 3.7 01/12/2014 0907   AST 20 01/12/2014 0907   ALT 24 01/12/2014 0907   ALKPHOS 93 01/12/2014 0907   BILITOT 0.5 01/12/2014 0907   GFRNONAA >89 01/12/2014 0907   GFRNONAA >60 06/16/2007 0930   GFRAA >89 01/12/2014 0907   GFRAA  06/16/2007 0930    >60        The eGFR has been calculated using the MDRD equation. This calculation has not been validated in all clinical    Lab Results  Component Value Date/Time   CHOL 210* 01/12/2014 09:07 AM    No components found for: HGA1C  Lab Results  Component Value Date/Time   AST 20 01/12/2014 09:07 AM    Assessment and Plan  Other specified diabetes mellitus without complications - Plan: Results for orders placed or performed in visit on 08/07/14  Glucose (CBG)  Result Value Ref Range   POC Glucose 211 (A) 70 - 99 mg/dl  HgB A1c  Result Value Ref Range   Hemoglobin A1C 8.9    Hemoglobin A1c has trended down, still uncontrolled, I have switched metformin to Janumet, continue with glyburide as well as Lantus, continue with low carbohydrate diet, fingerstick glucose monitoring at home, we'll  repeat A1c  in 3 months , glyBURIDE (DIABETA) 5 MG tablet, Insulin Glargine (LANTUS SOLOSTAR) 100 UNIT/ML Solostar Pen, COMPLETE METABOLIC PANEL WITH GFR, sitaGLIPtin-metformin (JANUMET) 50-1000 MG per tablet  High cholesterol - Plan: currently patient is onatorvastatin (LIPITOR) 40 MG tablet, will repeat herLipid panel  Gastroesophageal reflux disease, esophagitis presence not specified - Plan: symptoms are controlled, continue with pantoprazole (PROTONIX) 40 MG tablet  Encounter for screening mammogram for breast cancer - Plan: MM DIGITAL SCREENING BILATERAL   Health Maintenance  -Mammogram: ordered  -Vaccinations:  uptodate with the flu shot   Return in about 3 months (around 11/06/2014) for diabetes, hypertension, hyperipidemia.  Lorayne Marek, MD

## 2014-08-21 ENCOUNTER — Encounter: Payer: Self-pay | Admitting: *Deleted

## 2014-08-22 ENCOUNTER — Encounter: Payer: Self-pay | Admitting: Obstetrics & Gynecology

## 2014-09-29 NOTE — Progress Notes (Signed)
Prior authorization form for Janumet completed and faxed to [facility] ([phone]).  Awaiting response.

## 2014-10-02 ENCOUNTER — Ambulatory Visit (HOSPITAL_COMMUNITY)
Admission: RE | Admit: 2014-10-02 | Discharge: 2014-10-02 | Disposition: A | Discharge status: Home / Self Care | Payer: 59 | Source: Ambulatory Visit | Attending: Internal Medicine | Admitting: Internal Medicine

## 2014-10-02 DIAGNOSIS — Z1231 Encounter for screening mammogram for malignant neoplasm of breast: Secondary | ICD-10-CM | POA: Insufficient documentation

## 2014-10-23 ENCOUNTER — Other Ambulatory Visit: Payer: Self-pay | Admitting: Internal Medicine

## 2014-10-25 ENCOUNTER — Other Ambulatory Visit: Payer: Self-pay | Admitting: Family Medicine

## 2014-10-25 DIAGNOSIS — E139 Other specified diabetes mellitus without complications: Secondary | ICD-10-CM

## 2014-10-25 MED ORDER — SITAGLIPTIN PHOS-METFORMIN HCL 50-1000 MG PO TABS: 1.0000 | ORAL_TABLET | Freq: Two times a day (BID) | ORAL | Status: DC

## 2014-10-26 ENCOUNTER — Telehealth: Payer: Self-pay | Admitting: Family Medicine

## 2014-10-26 DIAGNOSIS — F32A Depression, unspecified: Secondary | ICD-10-CM

## 2014-10-26 DIAGNOSIS — F329 Major depressive disorder, single episode, unspecified: Secondary | ICD-10-CM

## 2014-10-26 MED ORDER — SERTRALINE HCL 50 MG PO TABS: 50.0000 mg | ORAL_TABLET | Freq: Every day | ORAL | Status: DC

## 2014-10-26 NOTE — Telephone Encounter (Signed)
Left voice message to return call 

## 2014-10-26 NOTE — Telephone Encounter (Signed)
Please call patient. Metformin ready for pick up at pharmacy. She will no longer take janumet if she was taking it   Aside: PA needed for janumet

## 2014-11-03 DIAGNOSIS — N8110 Cystocele, unspecified: Secondary | ICD-10-CM | POA: Insufficient documentation

## 2014-11-03 DIAGNOSIS — N814 Uterovaginal prolapse, unspecified: Secondary | ICD-10-CM | POA: Insufficient documentation

## 2014-11-30 ENCOUNTER — Telehealth: Payer: Self-pay | Admitting: Internal Medicine

## 2014-11-30 NOTE — Telephone Encounter (Signed)
[facility] a [facility] for a consult, tomorrow 11/30/14, for urogyn Pelvic Surgery with Dr  Kathaleen MaserAlexis Dieter. Pt has been going there for other procedures in the recent past but [facility]. [facility]

## 2014-12-01 ENCOUNTER — Telehealth: Payer: Self-pay | Admitting: Internal Medicine

## 2014-12-01 ENCOUNTER — Other Ambulatory Visit: Payer: Self-pay

## 2014-12-01 DIAGNOSIS — N812 Incomplete uterovaginal prolapse: Secondary | ICD-10-CM

## 2014-12-01 NOTE — Telephone Encounter (Signed)
Pt has appt with Dr Dannial MonarchAlexis Deiter at [facility] but needs a referral from PCP for visit.  Please f/u with Dr Eulogio Beareiter's office, see telephone note from 11/30/14. Pt's appt is today at 11:15 am.

## 2014-12-01 NOTE — Telephone Encounter (Signed)
[facility] (Dr. Renette ButtersGolden) Patient has prolapse and needs surgery, however, DM is uncontrolled Asking for return call from PCP

## 2014-12-01 NOTE — Telephone Encounter (Signed)
[facility] requesting a referral for a consult, patient's appointment is today 11/30/2014 @ 11 am, for urogyn Pelvic Surgery with Dr Kathaleen MaserAlexis Dieter. Pt has been going there for other procedures in the recent past but [facility]. [facility]

## 2014-12-08 ENCOUNTER — Ambulatory Visit: Payer: 59 | Attending: Internal Medicine | Admitting: Internal Medicine

## 2014-12-08 VITALS — BP 123/80 | HR 95 | Temp 98.7°F | Resp 16 | Ht 62.0 in | Wt 162.0 lb

## 2014-12-08 DIAGNOSIS — E119 Type 2 diabetes mellitus without complications: Secondary | ICD-10-CM

## 2014-12-08 DIAGNOSIS — E7800 Pure hypercholesterolemia, unspecified: Secondary | ICD-10-CM

## 2014-12-08 DIAGNOSIS — R05 Cough: Secondary | ICD-10-CM | POA: Diagnosis not present

## 2014-12-08 DIAGNOSIS — R0981 Nasal congestion: Secondary | ICD-10-CM | POA: Diagnosis not present

## 2014-12-08 DIAGNOSIS — J069 Acute upper respiratory infection, unspecified: Secondary | ICD-10-CM | POA: Diagnosis not present

## 2014-12-08 DIAGNOSIS — R059 Cough, unspecified: Secondary | ICD-10-CM

## 2014-12-08 LAB — GLUCOSE, POCT (MANUAL RESULT ENTRY): POC Glucose: 195 mg/dl — AB (ref 70–99)

## 2014-12-08 LAB — POCT GLYCOSYLATED HEMOGLOBIN (HGB A1C): Hemoglobin A1C: 11

## 2014-12-08 MED ORDER — INSULIN GLARGINE 100 UNIT/ML SOLOSTAR PEN: 45.0000 [IU] | PEN_INJECTOR | Freq: Every day | SUBCUTANEOUS | Status: DC

## 2014-12-08 MED ORDER — METFORMIN HCL 1000 MG PO TABS: 1000.0000 mg | ORAL_TABLET | Freq: Two times a day (BID) | ORAL | Status: DC

## 2014-12-08 MED ORDER — FLUTICASONE PROPIONATE 50 MCG/ACT NA SUSP: 2.0000 | Freq: Every day | NASAL | Status: DC

## 2014-12-08 MED ORDER — ATORVASTATIN CALCIUM 40 MG PO TABS: 40.0000 mg | ORAL_TABLET | Freq: Every day | ORAL | Status: DC

## 2014-12-08 MED ORDER — BENZONATATE 100 MG PO CAPS: 100.0000 mg | ORAL_CAPSULE | Freq: Three times a day (TID) | ORAL | Status: DC | PRN

## 2014-12-08 MED ORDER — GLYBURIDE 5 MG PO TABS: 5.0000 mg | ORAL_TABLET | Freq: Two times a day (BID) | ORAL | Status: DC

## 2014-12-08 NOTE — Progress Notes (Signed)
F/U DM Stated Glucose been running between 200-300 Taking Insulin as prescribed No taking Metformin x 2 weeks Complaining of dry cough x 5

## 2014-12-08 NOTE — Patient Instructions (Signed)
DASH Eating Plan DASH stands for "Dietary Approaches to Stop Hypertension." The DASH eating plan is a healthy eating plan that has been shown to reduce high blood pressure (hypertension). Additional health benefits may include reducing the risk of type 2 diabetes mellitus, heart disease, and stroke. The DASH eating plan may also help with weight loss. WHAT DO I NEED TO KNOW ABOUT THE DASH EATING PLAN? For the DASH eating plan, you will follow these general guidelines:  Choose foods with a percent daily value for sodium of less than 5% (as listed on the food label).  Use salt-free seasonings or herbs instead of table salt or sea salt.  Check with your health care provider or pharmacist before using salt substitutes.  Eat lower-sodium products, often labeled as "lower sodium" or "no salt added."  Eat fresh foods.  Eat more vegetables, fruits, and low-fat dairy products.  Choose whole grains. Look for the word "whole" as the first word in the ingredient list.  Choose fish and skinless chicken or turkey more often than red meat. Limit fish, poultry, and meat to 6 oz (170 g) each day.  Limit sweets, desserts, sugars, and sugary drinks.  Choose heart-healthy fats.  Limit cheese to 1 oz (28 g) per day.  Eat more home-cooked food and less restaurant, buffet, and fast food.  Limit fried foods.  Cook foods using methods other than frying.  Limit canned vegetables. If you do use them, rinse them well to decrease the sodium.  When eating at a restaurant, ask that your food be prepared with less salt, or no salt if possible. WHAT FOODS CAN I EAT? Seek help from a dietitian for individual calorie needs. Grains Whole grain or whole wheat bread. Brown rice. Whole grain or whole wheat pasta. Quinoa, bulgur, and whole grain cereals. Low-sodium cereals. Corn or whole wheat flour tortillas. Whole grain cornbread. Whole grain crackers. Low-sodium crackers. Vegetables Fresh or frozen vegetables  (raw, steamed, roasted, or grilled). Low-sodium or reduced-sodium tomato and vegetable juices. Low-sodium or reduced-sodium tomato sauce and paste. Low-sodium or reduced-sodium canned vegetables.  Fruits All fresh, canned (in natural juice), or frozen fruits. Meat and Other Protein Products Ground beef (85% or leaner), grass-fed beef, or beef trimmed of fat. Skinless chicken or turkey. Ground chicken or turkey. Pork trimmed of fat. All fish and seafood. Eggs. Dried beans, peas, or lentils. Unsalted nuts and seeds. Unsalted canned beans. Dairy Low-fat dairy products, such as skim or 1% milk, 2% or reduced-fat cheeses, low-fat ricotta or cottage cheese, or plain low-fat yogurt. Low-sodium or reduced-sodium cheeses. Fats and Oils Tub margarines without trans fats. Light or reduced-fat mayonnaise and salad dressings (reduced sodium). Avocado. Safflower, olive, or canola oils. Natural peanut or almond butter. Other Unsalted popcorn and pretzels. The items listed above may not be a complete list of recommended foods or beverages. Contact your dietitian for more options. WHAT FOODS ARE NOT RECOMMENDED? Grains White bread. White pasta. White rice. Refined cornbread. Bagels and croissants. Crackers that contain trans fat. Vegetables Creamed or fried vegetables. Vegetables in a cheese sauce. Regular canned vegetables. Regular canned tomato sauce and paste. Regular tomato and vegetable juices. Fruits Dried fruits. Canned fruit in light or heavy syrup. Fruit juice. Meat and Other Protein Products Fatty cuts of meat. Ribs, chicken wings, bacon, sausage, bologna, salami, chitterlings, fatback, hot dogs, bratwurst, and packaged luncheon meats. Salted nuts and seeds. Canned beans with salt. Dairy Whole or 2% milk, cream, half-and-half, and cream cheese. Whole-fat or sweetened yogurt. Full-fat   cheeses or blue cheese. Nondairy creamers and whipped toppings. Processed cheese, cheese spreads, or cheese  curds. Condiments Onion and garlic salt, seasoned salt, table salt, and sea salt. Canned and packaged gravies. Worcestershire sauce. Tartar sauce. Barbecue sauce. Teriyaki sauce. Soy sauce, including reduced sodium. Steak sauce. Fish sauce. Oyster sauce. Cocktail sauce. Horseradish. Ketchup and mustard. Meat flavorings and tenderizers. Bouillon cubes. Hot sauce. Tabasco sauce. Marinades. Taco seasonings. Relishes. Fats and Oils Butter, stick margarine, lard, shortening, ghee, and bacon fat. Coconut, palm kernel, or palm oils. Regular salad dressings. Other Pickles and olives. Salted popcorn and pretzels. The items listed above may not be a complete list of foods and beverages to avoid. Contact your dietitian for more information. WHERE CAN I FIND MORE INFORMATION? National Heart, Lung, and [employer] Institute: www.nhlbi.nih.gov/health/health-topics/topics/dash/ Document Released: 07/31/2011 Document Revised: 12/26/2013 Document Reviewed: 06/15/2013 ExitCare Patient Information 2015 [employer]. This information is not intended to replace advice given to you by your health care provider. Make sure you discuss any questions you have with your health care provider. Diabetes Mellitus and Food It is important for you to manage your blood sugar (glucose) level. Your blood glucose level can be greatly affected by what you eat. Eating healthier foods in the appropriate amounts throughout the day at about the same time each day will help you control your blood glucose level. It can also help slow or prevent worsening of your diabetes mellitus. Healthy eating may even help you improve the level of your blood pressure and reach or maintain a healthy weight.  HOW CAN FOOD AFFECT ME? Carbohydrates Carbohydrates affect your blood glucose level more than any other type of food. Your dietitian will help you determine how many carbohydrates to eat at each meal and teach you how to count carbohydrates. Counting  carbohydrates is important to keep your blood glucose at a healthy level, especially if you are using insulin or taking certain medicines for diabetes mellitus. Alcohol Alcohol can cause sudden decreases in blood glucose (hypoglycemia), especially if you use insulin or take certain medicines for diabetes mellitus. Hypoglycemia can be a life-threatening condition. Symptoms of hypoglycemia (sleepiness, dizziness, and disorientation) are similar to symptoms of having too much alcohol.  If your health care provider has given you approval to drink alcohol, do so in moderation and use the following guidelines:  Women should not have more than one drink per day, and men should not have more than two drinks per day. One drink is equal to:  12 oz of beer.  5 oz of wine.  1 oz of hard liquor.  Do not drink on an empty stomach.  Keep yourself hydrated. Have water, diet soda, or unsweetened iced tea.  Regular soda, juice, and other mixers might contain a lot of carbohydrates and should be counted. WHAT FOODS ARE NOT RECOMMENDED? As you make food choices, it is important to remember that all foods are not the same. Some foods have fewer nutrients per serving than other foods, even though they might have the same number of calories or carbohydrates. It is difficult to get your body what it needs when you eat foods with fewer nutrients. Examples of foods that you should avoid that are high in calories and carbohydrates but low in nutrients include:  Trans fats (most processed foods list trans fats on the Nutrition Facts label).  Regular soda.  Juice.  Candy.  Sweets, such as cake, pie, doughnuts, and cookies.  Fried foods. WHAT FOODS CAN I EAT? Have nutrient-rich foods,   which will nourish your body and keep you healthy. The food you should eat also will depend on several factors, including:  The calories you need.  The medicines you take.  Your weight.  Your blood glucose level.  Your  blood pressure level.  Your cholesterol level. You also should eat a variety of foods, including:  Protein, such as meat, poultry, fish, tofu, nuts, and seeds (lean animal proteins are best).  Fruits.  Vegetables.  Dairy products, such as milk, cheese, and yogurt (low fat is best).  Breads, grains, pasta, cereal, rice, and beans.  Fats such as olive oil, trans fat-free margarine, canola oil, avocado, and olives. DOES EVERYONE WITH DIABETES MELLITUS HAVE THE SAME MEAL PLAN? Because every person with diabetes mellitus is different, there is not one meal plan that works for everyone. It is very important that you meet with a dietitian who will help you create a meal plan that is just right for you. Document Released: 05/08/2005 Document Revised: 08/16/2013 Document Reviewed: 07/08/2013 ExitCare Patient Information 2015 [employer]. This information is not intended to replace advice given to you by your health care provider. Make sure you discuss any questions you have with your health care provider.  

## 2014-12-08 NOTE — Progress Notes (Signed)
MRN: [id number] Name: Nicole Villanueva  Sex: female Age: 49 y.o. DOB: 1966-01-04  Allergies: Review of patient's allergies indicates no known allergies.  Chief Complaint  Patient presents with  . Follow-up    HPI: Patient is 49 y.o. female who has history of diabetes hyperlipidemia comes today for followup , as per patient she ran out of her medication metformin and Glucotrol for the last 2-3 weeks, has been taking her Lantus 40 units each bedtime, her fasting blood sugar is usually more than 200 mg/dL, also noted her hemoglobin A1c has trended up, patient is also complaining of having some URI symptoms nasal congestion minimal productive cough denies any fever chills chest and shortness of breath. Patient has tried over-the-counter medication without much improvement . Patient denies smoking cigarettes.  Past Medical History  Diagnosis Date  . Hypertension   . Diabetes mellitus   . Hyperlipidemia     Past Surgical History  Procedure Laterality Date  . Cholecystectomy        Medication List       This list is accurate as of: 12/08/14  5:32 PM.  Always use your most recent med list.               atorvastatin 40 MG tablet  Commonly known as:  LIPITOR  Take 1 tablet (40 mg total) by mouth daily.     benzonatate 100 MG capsule  Commonly known as:  TESSALON  Take 1 capsule (100 mg total) by mouth 3 (three) times daily as needed for cough.     fluticasone 50 MCG/ACT nasal spray  Commonly known as:  FLONASE  Place 2 sprays into both nostrils daily.     glucose blood test strip  Commonly known as:  FREESTYLE TEST STRIPS  Use as instructed     glyBURIDE 5 MG tablet  Commonly known as:  DIABETA  Take 1 tablet (5 mg total) by mouth 2 (two) times daily with a meal.     Insulin Glargine 100 UNIT/ML Solostar Pen  Commonly known as:  LANTUS SOLOSTAR  Inject 45 Units into the skin daily at 10 pm. Inject 25 units into skin every night.     Lancet Device Misc    Check blood sugar 3x per day     metFORMIN 1000 MG tablet  Commonly known as:  GLUCOPHAGE  Take 1 tablet (1,000 mg total) by mouth 2 (two) times daily with a meal.     pantoprazole 40 MG tablet  Commonly known as:  PROTONIX  Take 1 tablet (40 mg total) by mouth daily.     sertraline 50 MG tablet  Commonly known as:  ZOLOFT  Take 1 tablet (50 mg total) by mouth daily.        Meds ordered this encounter  Medications  . atorvastatin (LIPITOR) 40 MG tablet    Sig: Take 1 tablet (40 mg total) by mouth daily.    Dispense:  90 tablet    Refill:  3  . glyBURIDE (DIABETA) 5 MG tablet    Sig: Take 1 tablet (5 mg total) by mouth 2 (two) times daily with a meal.    Dispense:  60 tablet    Refill:  3  . metFORMIN (GLUCOPHAGE) 1000 MG tablet    Sig: Take 1 tablet (1,000 mg total) by mouth 2 (two) times daily with a meal.    Dispense:  60 tablet    Refill:  4  . Insulin Glargine (LANTUS SOLOSTAR)  100 UNIT/ML Solostar Pen    Sig: Inject 45 Units into the skin daily at 10 pm. Inject 25 units into skin every night.    Dispense:  15 mL    Refill:  5  . fluticasone (FLONASE) 50 MCG/ACT nasal spray    Sig: Place 2 sprays into both nostrils daily.    Dispense:  16 g    Refill:  3  . benzonatate (TESSALON) 100 MG capsule    Sig: Take 1 capsule (100 mg total) by mouth 3 (three) times daily as needed for cough.    Dispense:  30 capsule    Refill:  1    Immunization History  Administered Date(s) Administered  . Influenza,inj,Quad PF,36+ Mos 06/07/2014    Family History  Problem Relation Age of Onset  . Diabetes Mother   . Diabetes Father     History  Substance Use Topics  . Smoking status: Never Smoker   . Smokeless tobacco: Never Used  . Alcohol Use: No    Review of Systems   As noted in HPI  Filed Vitals:   12/08/14 1708  BP: 123/80  Pulse: 95  Temp: 98.7 F (37.1 C)  Resp: 16    Physical Exam  Physical Exam  Constitutional: No distress.  HENT:  Some nasal  congestion no sinus tenderness, enlarged tonsils no exudate  Eyes: EOM are normal. Pupils are equal, round, and reactive to light.  Cardiovascular: Normal rate and regular rhythm.   Pulmonary/Chest: Breath sounds normal. No respiratory distress. She has no wheezes. She has no rales.  Musculoskeletal: She exhibits no edema.  Lymphadenopathy:    She has cervical adenopathy.    CBC    Component Value Date/Time   WBC 8.0 01/12/2014 0907   WBC 11.3* 12/21/2013 2101   RBC 4.56 01/12/2014 0907   RBC 4.29 12/21/2013 2101   HGB 13.5 01/12/2014 0907   HGB 12.6 12/21/2013 2101   HCT 39.6 01/12/2014 0907   HCT 39.0 12/21/2013 2101   PLT 345 01/12/2014 0907   MCV 86.8 01/12/2014 0907   MCV 90.9 12/21/2013 2101   LYMPHSABS 2.8 06/16/2007 0930   MONOABS 0.4 06/16/2007 0930   EOSABS 0.0 06/16/2007 0930   BASOSABS 0.0 06/16/2007 0930    CMP     Component Value Date/Time   NA 138 08/07/2014 1001   K 4.8 08/07/2014 1001   CL 103 08/07/2014 1001   CO2 28 08/07/2014 1001   GLUCOSE 164* 08/07/2014 1001   BUN 12 08/07/2014 1001   CREATININE 0.46* 08/07/2014 1001   CREATININE 0.42 06/16/2007 0930   CALCIUM 9.5 08/07/2014 1001   PROT 7.2 08/07/2014 1001   ALBUMIN 4.1 08/07/2014 1001   AST 19 08/07/2014 1001   ALT 24 08/07/2014 1001   ALKPHOS 111 08/07/2014 1001   BILITOT 0.3 08/07/2014 1001   GFRNONAA >89 08/07/2014 1001   GFRNONAA >60 06/16/2007 0930   GFRAA >89 08/07/2014 1001   GFRAA  06/16/2007 0930    >60        The eGFR has been calculated using the MDRD equation. This calculation has not been validated in all clinical    Lab Results  Component Value Date/Time   CHOL 150 08/07/2014 10:01 AM    Lab Results  Component Value Date/Time   HGBA1C 11.0 12/08/2014 05:09 PM    Lab Results  Component Value Date/Time   AST 19 08/07/2014 10:01 AM    Assessment and Plan  Type 2 diabetes mellitus without complication -  Plan:  Results for orders placed or performed in visit  on 12/08/14  POCT glycosylated hemoglobin (Hb A1C)  Result Value Ref Range   Hemoglobin A1C 11.0   Glucose (CBG)  Result Value Ref Range   POC Glucose 195.0 (A) 70 - 99 mg/dl   Diabetes is uncontrolled, hemoglobin A1c has trended up, resume back on metformin and glyburide, also increased the dose of Lantus to 45 units each bedtime, advised patient for diabetes meal planning, repeat A1c in 3 months , Glucose (CBG), Microalbumin, urine, glyBURIDE (DIABETA) 5 MG tablet, metFORMIN (GLUCOPHAGE) 1000 MG tablet, Insulin Glargine (LANTUS SOLOSTAR) 100 UNIT/ML Solostar Pen, COMPLETE METABOLIC PANEL WITH GFR  High cholesterol - Plan: continue with atorvastatin (LIPITOR) 40 MG tablet, we'll check fasting lipid panel on the next visit  URI (upper respiratory infection) Likely viral, advise patient to do saltwater gargles, push fluids, symptomatic treatment prescribed Flonase and Tessalon Perles  Nasal congestion - Plan: fluticasone (FLONASE) 50 MCG/ACT nasal spray  Cough - Plan: benzonatate (TESSALON) 100 MG capsule     Return in about 3 months (around 03/09/2015), or if symptoms worsen or fail to improve, for diabetes, hyperipidemia.   This note has been created with Surveyor, quantity. Any transcriptional errors are unintentional.    Lorayne Marek, MD

## 2014-12-09 LAB — COMPLETE METABOLIC PANEL WITH GFR
ALT: 30 U/L (ref 0–35)
AST: 20 U/L (ref 0–37)
Albumin: 4.1 g/dL (ref 3.5–5.2)
Alkaline Phosphatase: 87 U/L (ref 39–117)
BUN: 9 mg/dL (ref 6–23)
CO2: 27 mEq/L (ref 19–32)
Calcium: 9.1 mg/dL (ref 8.4–10.5)
Chloride: 101 mEq/L (ref 96–112)
Creat: 0.42 mg/dL — ABNORMAL LOW (ref 0.50–1.10)
GFR, Est African American: >89 mL/min
GFR, Est Non African American: >89 mL/min
Glucose, Bld: 196 mg/dL — ABNORMAL HIGH (ref 70–99)
Potassium: 4.3 mEq/L (ref 3.5–5.3)
Sodium: 137 mEq/L (ref 135–145)
Total Bilirubin: 0.5 mg/dL (ref 0.2–1.2)
Total Protein: 7.2 g/dL (ref 6.0–8.3)

## 2014-12-09 LAB — MICROALBUMIN, URINE: Microalb, Ur: 0.6 mg/dL (ref <2.0)

## 2015-01-18 ENCOUNTER — Other Ambulatory Visit: Payer: Self-pay

## 2015-01-18 DIAGNOSIS — E119 Type 2 diabetes mellitus without complications: Secondary | ICD-10-CM

## 2015-01-18 MED ORDER — INSULIN GLARGINE 100 UNIT/ML SOLOSTAR PEN: 45.0000 [IU] | PEN_INJECTOR | Freq: Every day | SUBCUTANEOUS | Status: DC

## 2015-03-08 ENCOUNTER — Encounter: Payer: Self-pay | Admitting: Internal Medicine

## 2015-03-08 ENCOUNTER — Ambulatory Visit: Payer: 59 | Attending: Internal Medicine | Admitting: Internal Medicine

## 2015-03-08 ENCOUNTER — Ambulatory Visit: Payer: 59 | Admitting: Internal Medicine

## 2015-03-08 VITALS — BP 117/76 | HR 76 | Temp 98.0°F | Resp 16 | Wt 159.0 lb

## 2015-03-08 DIAGNOSIS — E119 Type 2 diabetes mellitus without complications: Secondary | ICD-10-CM | POA: Diagnosis present

## 2015-03-08 DIAGNOSIS — H538 Other visual disturbances: Secondary | ICD-10-CM | POA: Diagnosis not present

## 2015-03-08 DIAGNOSIS — Z794 Long term (current) use of insulin: Secondary | ICD-10-CM | POA: Diagnosis not present

## 2015-03-08 DIAGNOSIS — E7800 Pure hypercholesterolemia, unspecified: Secondary | ICD-10-CM

## 2015-03-08 DIAGNOSIS — E139 Other specified diabetes mellitus without complications: Secondary | ICD-10-CM | POA: Diagnosis not present

## 2015-03-08 DIAGNOSIS — E785 Hyperlipidemia, unspecified: Secondary | ICD-10-CM | POA: Diagnosis not present

## 2015-03-08 DIAGNOSIS — I10 Essential (primary) hypertension: Secondary | ICD-10-CM | POA: Insufficient documentation

## 2015-03-08 DIAGNOSIS — Z79899 Other long term (current) drug therapy: Secondary | ICD-10-CM | POA: Diagnosis not present

## 2015-03-08 LAB — COMPLETE METABOLIC PANEL WITH GFR
ALT: 25 U/L (ref 0–35)
AST: 21 U/L (ref 0–37)
Albumin: 3.9 g/dL (ref 3.5–5.2)
Alkaline Phosphatase: 78 U/L (ref 39–117)
BUN: 6 mg/dL (ref 6–23)
CO2: 26 mEq/L (ref 19–32)
Calcium: 9.1 mg/dL (ref 8.4–10.5)
Chloride: 101 mEq/L (ref 96–112)
Creat: 0.46 mg/dL — ABNORMAL LOW (ref 0.50–1.10)
GFR, Est African American: >89 mL/min
GFR, Est Non African American: >89 mL/min
Glucose, Bld: 103 mg/dL — ABNORMAL HIGH (ref 70–99)
Potassium: 5 mEq/L (ref 3.5–5.3)
Sodium: 140 mEq/L (ref 135–145)
Total Bilirubin: 0.4 mg/dL (ref 0.2–1.2)
Total Protein: 7.1 g/dL (ref 6.0–8.3)

## 2015-03-08 LAB — GLUCOSE, POCT (MANUAL RESULT ENTRY): POC Glucose: 120 mg/dl — AB (ref 70–99)

## 2015-03-08 LAB — LIPID PANEL
Cholesterol: 199 mg/dL (ref 0–200)
HDL: 35 mg/dL — ABNORMAL LOW (ref >=46)
LDL Cholesterol: 126 mg/dL — ABNORMAL HIGH (ref 0–99)
Total CHOL/HDL Ratio: 5.7 Ratio
Triglycerides: 189 mg/dL — ABNORMAL HIGH (ref <150)
VLDL: 38 mg/dL (ref 0–40)

## 2015-03-08 LAB — POCT GLYCOSYLATED HEMOGLOBIN (HGB A1C): Hemoglobin A1C: 7.3

## 2015-03-08 MED ORDER — GLYBURIDE 5 MG PO TABS: 5.0000 mg | ORAL_TABLET | Freq: Two times a day (BID) | ORAL | Status: DC

## 2015-03-08 MED ORDER — INSULIN GLARGINE 100 UNIT/ML SOLOSTAR PEN: 45.0000 [IU] | PEN_INJECTOR | Freq: Every day | SUBCUTANEOUS | Status: DC

## 2015-03-08 MED ORDER — METFORMIN HCL 1000 MG PO TABS: 1000.0000 mg | ORAL_TABLET | Freq: Two times a day (BID) | ORAL | Status: DC

## 2015-03-08 MED ORDER — ATORVASTATIN CALCIUM 40 MG PO TABS: 40.0000 mg | ORAL_TABLET | Freq: Every day | ORAL | Status: DC

## 2015-03-08 MED ORDER — GLUCOSE BLOOD VI STRP: ORAL_STRIP | Status: DC

## 2015-03-08 MED ORDER — GLUCOCOM LANCETS 28G MISC: Status: DC

## 2015-03-08 NOTE — Progress Notes (Signed)
Patient here for follow up on her diabetes Patient is also requesting refills on her medications The prescription for test strips and lancets she would like printed She gets these filled at [facility]

## 2015-03-08 NOTE — Progress Notes (Signed)
MRN: [id number] Name: Nicole Villanueva  Sex: female Age: 49 y.o. DOB: Mar 16, 1966  Allergies: Review of patient's allergies indicates no known allergies.  Chief Complaint  Patient presents with  . Follow-up    HPI: Patient is 49 y.o. female who has history of diabetes, hyperlipidemia comes today for followup, as per patient she is compliant in taking her medications currently on Lantus 50 units each bedtime, metformin and Glucotrol, she denies any hypoglycemic symptoms her hemoglobin A1c has trended down from 11% to 7.3%, she's requesting refill on her medications, denies any acute symptoms denies any headache dizziness chest and shortness of breath.patient complains of occasional blurry vision, has not followed up with ophthalmologist for the last 3 years, she is requesting another referral.  Past Medical History  Diagnosis Date  . Hypertension   . Diabetes mellitus   . Hyperlipidemia     Past Surgical History  Procedure Laterality Date  . Cholecystectomy        Medication List       This list is accurate as of: 03/08/15 11:42 AM.  Always use your most recent med list.               atorvastatin 40 MG tablet  Commonly known as:  LIPITOR  Take 1 tablet (40 mg total) by mouth daily.     benzonatate 100 MG capsule  Commonly known as:  TESSALON  Take 1 capsule (100 mg total) by mouth 3 (three) times daily as needed for cough.     fluticasone 50 MCG/ACT nasal spray  Commonly known as:  FLONASE  Place 2 sprays into both nostrils daily.     GlucoCom Lancets Misc  Check blood sugar TID & QHS     glucose blood test strip  Commonly known as:  FREESTYLE TEST STRIPS  Use as instructed     glyBURIDE 5 MG tablet  Commonly known as:  DIABETA  Take 1 tablet (5 mg total) by mouth 2 (two) times daily with a meal.     Insulin Glargine 100 UNIT/ML Solostar Pen  Commonly known as:  LANTUS SOLOSTAR  Inject 45 Units into the skin daily at 10 pm.     Lancet Device  Misc  Check blood sugar 3x per day     metFORMIN 1000 MG tablet  Commonly known as:  GLUCOPHAGE  Take 1 tablet (1,000 mg total) by mouth 2 (two) times daily with a meal.     pantoprazole 40 MG tablet  Commonly known as:  PROTONIX  Take 1 tablet (40 mg total) by mouth daily.     sertraline 50 MG tablet  Commonly known as:  ZOLOFT  Take 1 tablet (50 mg total) by mouth daily.        Meds ordered this encounter  Medications  . atorvastatin (LIPITOR) 40 MG tablet    Sig: Take 1 tablet (40 mg total) by mouth daily.    Dispense:  90 tablet    Refill:  3  . glyBURIDE (DIABETA) 5 MG tablet    Sig: Take 1 tablet (5 mg total) by mouth 2 (two) times daily with a meal.    Dispense:  60 tablet    Refill:  3  . Insulin Glargine (LANTUS SOLOSTAR) 100 UNIT/ML Solostar Pen    Sig: Inject 45 Units into the skin daily at 10 pm.    Dispense:  15 mL    Refill:  5  . metFORMIN (GLUCOPHAGE) 1000 MG tablet  Sig: Take 1 tablet (1,000 mg total) by mouth 2 (two) times daily with a meal.    Dispense:  60 tablet    Refill:  4  . glucose blood (FREESTYLE TEST STRIPS) test strip    Sig: Use as instructed    Dispense:  100 each    Refill:  12    Please dispense appropriate test strips to go with patients device.  . GlucoCom Lancets MISC    Sig: Check blood sugar TID & QHS    Dispense:  100 each    Refill:  3    Immunization History  Administered Date(s) Administered  . Influenza,inj,Quad PF,36+ Mos 06/07/2014    Family History  Problem Relation Age of Onset  . Diabetes Mother   . Diabetes Father     History  Substance Use Topics  . Smoking status: Never Smoker   . Smokeless tobacco: Never Used  . Alcohol Use: No    Review of Systems   As noted in HPI  Filed Vitals:   03/08/15 1110  BP: 117/76  Pulse: 76  Temp: 98 F (36.7 C)  Resp: 16    Physical Exam  Physical Exam  Constitutional: No distress.  Eyes: EOM are normal. Pupils are equal, round, and reactive to light.   Cardiovascular: Normal rate and regular rhythm.   Pulmonary/Chest: Breath sounds normal. No respiratory distress. She has no wheezes. She has no rales.  Musculoskeletal: She exhibits no edema.    CBC    Component Value Date/Time   WBC 8.0 01/12/2014 0907   WBC 11.3* 12/21/2013 2101   RBC 4.56 01/12/2014 0907   RBC 4.29 12/21/2013 2101   HGB 13.5 01/12/2014 0907   HGB 12.6 12/21/2013 2101   HCT 39.6 01/12/2014 0907   HCT 39.0 12/21/2013 2101   PLT 345 01/12/2014 0907   MCV 86.8 01/12/2014 0907   MCV 90.9 12/21/2013 2101   LYMPHSABS 2.8 06/16/2007 0930   MONOABS 0.4 06/16/2007 0930   EOSABS 0.0 06/16/2007 0930   BASOSABS 0.0 06/16/2007 0930    CMP     Component Value Date/Time   NA 137 12/08/2014 1732   K 4.3 12/08/2014 1732   CL 101 12/08/2014 1732   CO2 27 12/08/2014 1732   GLUCOSE 196* 12/08/2014 1732   BUN 9 12/08/2014 1732   CREATININE 0.42* 12/08/2014 1732   CREATININE 0.42 06/16/2007 0930   CALCIUM 9.1 12/08/2014 1732   PROT 7.2 12/08/2014 1732   ALBUMIN 4.1 12/08/2014 1732   AST 20 12/08/2014 1732   ALT 30 12/08/2014 1732   ALKPHOS 87 12/08/2014 1732   BILITOT 0.5 12/08/2014 1732   GFRNONAA >89 12/08/2014 1732   GFRNONAA >60 06/16/2007 0930   GFRAA >89 12/08/2014 1732   GFRAA  06/16/2007 0930    >60        The eGFR has been calculated using the MDRD equation. This calculation has not been validated in all clinical    Lab Results  Component Value Date/Time   CHOL 150 08/07/2014 10:01 AM    Lab Results  Component Value Date/Time   HGBA1C 7.30 03/08/2015 11:11 AM    Lab Results  Component Value Date/Time   AST 20 12/08/2014 05:32 PM    Assessment and Plan  Other specified diabetes mellitus without complications - Plan:  Results for orders placed or performed in visit on 03/08/15  Glucose (CBG)  Result Value Ref Range   POC Glucose 120.0 (A) 70 - 99 mg/dl  HgB A1c  Result Value Ref Range   Hemoglobin A1C 7.30    Diabetes is fairly  well controlled, hemoglobin A1c has improved, she will continue with current medications continue with diabetes meal planning, repeat A1c in 3 months HgB A1c, glyBURIDE (DIABETA) 5 MG tablet, Insulin Glargine (LANTUS SOLOSTAR) 100 UNIT/ML Solostar Pen, metFORMIN (GLUCOPHAGE) 1000 MG tablet, glucose blood (FREESTYLE TEST STRIPS) test strip, GlucoCom Lancets MISC, COMPLETE METABOLIC PANEL WITH GFR  High cholesterol - Plan: Currently patient is on atorvastatin (LIPITOR) 40 MG tablet, will repeat Lipid panel  Blurry vision - Plan: Ambulatory referral to Ophthalmology    Return in about 3 months (around 06/08/2015), or if symptoms worsen or fail to improve.   This note has been created with Surveyor, quantity. Any transcriptional errors are unintentional.    Lorayne Marek, MD

## 2015-03-08 NOTE — Patient Instructions (Signed)
La diabetes mellitus y los alimentos (Diabetes Mellitus and Food) Es importante que controle su nivel de azcar en la sangre (glucosa). El nivel de glucosa en sangre depende en gran medida de lo que usted come. Comer alimentos saludables en las cantidades adecuadas a lo largo del da, aproximadamente a la misma hora todos los das, lo ayudar a controlar su nivel de glucosa en sangre. Tambin puede ayudarlo a retrasar o evitar el empeoramiento de la diabetes mellitus. Comer de manera saludable incluso puede ayudarlo a mejorar el nivel de presin arterial y a alcanzar o mantener un peso saludable.  CMO PUEDEN AFECTARME LOS ALIMENTOS? Carbohidratos Los carbohidratos afectan el nivel de glucosa en sangre ms que cualquier otro tipo de alimento. El nutricionista lo ayudar a determinar cuntos carbohidratos puede consumir en cada comida y ensearle a contarlos. El recuento de carbohidratos es importante para mantener la glucosa en sangre en un nivel saludable, en especial si utiliza insulina o toma determinados medicamentos para la diabetes mellitus. Alcohol El alcohol puede provocar disminuciones sbitas de la glucosa en sangre (hipoglucemia), en especial si utiliza insulina o toma determinados medicamentos para la diabetes mellitus. La hipoglucemia es una afeccin que puede poner en peligro la vida. Los sntomas de la hipoglucemia (somnolencia, mareos y desorientacin) son similares a los sntomas de haber consumido mucho alcohol.  Si el mdico lo autoriza a beber alcohol, hgalo con moderacin y siga estas pautas:  Las mujeres no deben beber ms de un trago por da, y los hombres no deben beber ms de dos tragos por da. Un trago es igual a:  12 onzas (355 ml) de cerveza  5 onzas de vino (150 ml) de vino  1,5onzas (45ml) de bebidas espirituosas  No beba con el estmago vaco.  Mantngase hidratado. Beba agua, gaseosas dietticas o t helado sin azcar.  Las gaseosas comunes, los jugos y  otros refrescos podran contener muchos carbohidratos y se deben contar. QU ALIMENTOS NO SE RECOMIENDAN? Cuando haga las elecciones de alimentos, es importante que recuerde que todos los alimentos son distintos. Algunos tienen menos nutrientes que otros por porcin, aunque podran tener la misma cantidad de caloras o carbohidratos. Es difcil darle al cuerpo lo que necesita cuando consume alimentos con menos nutrientes. Estos son algunos ejemplos de alimentos que debera evitar ya que contienen muchas caloras y carbohidratos, pero pocos nutrientes:  Grasas trans (la mayora de los alimentos procesados incluyen grasas trans en la etiqueta de Informacin nutricional).  Gaseosas comunes.  Jugos.  Caramelos.  Dulces, como tortas, pasteles, rosquillas y galletas.  Comidas fritas. QU ALIMENTOS PUEDO COMER? Consuma alimentos ricos en nutrientes, que nutrirn el cuerpo y lo mantendrn saludable. Los alimentos que debe comer tambin dependern de varios factores, como:  Las caloras que necesita.  Los medicamentos que toma.  Su peso.  El nivel de glucosa en sangre.  El nivel de presin arterial.  El nivel de colesterol. Tambin debe consumir una variedad de alimentos, como:  Protenas, como carne, aves, pescado, tofu, frutos secos y semillas (las protenas de animales magros son mejores).  Frutas.  Verduras.  Productos lcteos, como leche, queso y yogur (descremados son mejores).  Panes, granos, pastas, cereales, arroz y frijoles.  Grasas, como aceite de oliva, margarina sin grasas trans, aceite de canola, aguacate y aceitunas. TODOS LOS QUE PADECEN DIABETES MELLITUS TIENEN EL MISMO PLAN DE COMIDAS? Dado que todas las personas que padecen diabetes mellitus son distintas, no hay un solo plan de comidas que funcione para todos. Es muy   importante que se rena con un nutricionista que lo ayudar a crear un plan de comidas adecuado para usted. Document Released: 11/18/2007  Document Revised: 08/16/2013 ExitCare Patient Information 2015 [employer]. This information is not intended to replace advice given to you by your health care provider. Make sure you discuss any questions you have with your health care provider.  

## 2015-03-13 ENCOUNTER — Telehealth: Payer: Self-pay

## 2015-03-13 NOTE — Telephone Encounter (Signed)
-----   Message from Doris Cheadleeepak Advani, MD sent at 03/12/2015 10:47 AM EDT ----- Call and let the patient know that her cholesterol level has trended up compared to last blood work, advise patient for compliance with low fat diet, continue with Lipitor.

## 2015-03-13 NOTE — Telephone Encounter (Signed)
Patient is aware of her lab results 

## 2015-03-26 LAB — HM DIABETES EYE EXAM

## 2015-04-02 ENCOUNTER — Telehealth: Payer: Self-pay | Admitting: *Deleted

## 2015-04-02 DIAGNOSIS — E139 Other specified diabetes mellitus without complications: Secondary | ICD-10-CM

## 2015-04-02 MED ORDER — GLUCOSE BLOOD VI STRP: ORAL_STRIP | Status: DC

## 2015-04-02 NOTE — Telephone Encounter (Signed)
Pharmacy requiring written RX for accu-chek aviva plus test strips use as directed tid for insurance purposes RX faxed to [phone] [employer]

## 2015-04-04 ENCOUNTER — Telehealth: Payer: Self-pay | Admitting: *Deleted

## 2015-04-04 MED ORDER — GLUCOSE BLOOD VI STRP: ORAL_STRIP | Status: DC

## 2015-04-04 NOTE — Telephone Encounter (Signed)
Attempted to fax RX for Accu-chek Aviva Plus test strips to [employer] at [phone] X [phone]. Line continues to be busy Will send by fax through [employer].

## 2015-06-22 ENCOUNTER — Ambulatory Visit (INDEPENDENT_AMBULATORY_CARE_PROVIDER_SITE_OTHER): Payer: 59 | Admitting: Emergency Medicine

## 2015-06-22 VITALS — BP 100/78 | HR 66 | Temp 98.2°F | Resp 16 | Ht 59.5 in | Wt 151.8 lb

## 2015-06-22 DIAGNOSIS — M5412 Radiculopathy, cervical region: Secondary | ICD-10-CM | POA: Diagnosis not present

## 2015-06-22 DIAGNOSIS — Z23 Encounter for immunization: Secondary | ICD-10-CM

## 2015-06-22 DIAGNOSIS — E7800 Pure hypercholesterolemia, unspecified: Secondary | ICD-10-CM

## 2015-06-22 DIAGNOSIS — K219 Gastro-esophageal reflux disease without esophagitis: Secondary | ICD-10-CM | POA: Diagnosis not present

## 2015-06-22 DIAGNOSIS — E139 Other specified diabetes mellitus without complications: Secondary | ICD-10-CM

## 2015-06-22 DIAGNOSIS — E119 Type 2 diabetes mellitus without complications: Secondary | ICD-10-CM

## 2015-06-22 LAB — COMPREHENSIVE METABOLIC PANEL
ALT: 29 U/L (ref 6–29)
AST: 21 U/L (ref 10–35)
Albumin: 4.6 g/dL (ref 3.6–5.1)
Alkaline Phosphatase: 95 U/L (ref 33–115)
BUN: 9 mg/dL (ref 7–25)
CO2: 28 mmol/L (ref 20–31)
Calcium: 9.4 mg/dL (ref 8.6–10.2)
Chloride: 100 mmol/L (ref 98–110)
Creat: 0.43 mg/dL — ABNORMAL LOW (ref 0.50–1.10)
Glucose, Bld: 91 mg/dL (ref 65–99)
Potassium: 4.5 mmol/L (ref 3.5–5.3)
Sodium: 139 mmol/L (ref 135–146)
Total Bilirubin: 0.6 mg/dL (ref 0.2–1.2)
Total Protein: 7.9 g/dL (ref 6.1–8.1)

## 2015-06-22 LAB — HEMOGLOBIN A1C: Hgb A1c MFr Bld: 8.9 % — AB (ref 4.0–6.0)

## 2015-06-22 LAB — POCT URINALYSIS DIP (MANUAL ENTRY)
Bilirubin, UA: NEGATIVE
Blood, UA: NEGATIVE
Glucose, UA: NEGATIVE
Ketones, POC UA: NEGATIVE
Leukocytes, UA: NEGATIVE
Nitrite, UA: NEGATIVE
Protein Ur, POC: NEGATIVE
Spec Grav, UA: 1.02
Urobilinogen, UA: 0.2
pH, UA: 5.5

## 2015-06-22 LAB — GLUCOSE, POCT (MANUAL RESULT ENTRY): POC Glucose: 97 mg/dl (ref 70–99)

## 2015-06-22 LAB — POC MICROSCOPIC URINALYSIS (UMFC)

## 2015-06-22 LAB — LIPID PANEL
Cholesterol: 117 mg/dL — ABNORMAL LOW (ref 125–200)
HDL: 38 mg/dL — ABNORMAL LOW (ref >=46)
LDL Cholesterol: 51 mg/dL (ref <130)
Total CHOL/HDL Ratio: 3.1 Ratio (ref <=5.0)
Triglycerides: 138 mg/dL (ref <150)
VLDL: 28 mg/dL (ref <30)

## 2015-06-22 LAB — POCT GLYCOSYLATED HEMOGLOBIN (HGB A1C): Hemoglobin A1C: 8.9

## 2015-06-22 MED ORDER — METFORMIN HCL 1000 MG PO TABS
1000.0000 mg | ORAL_TABLET | Freq: Two times a day (BID) | ORAL | Status: DC
Start: 2015-06-22 — End: 2016-04-22

## 2015-06-22 MED ORDER — CYCLOBENZAPRINE HCL 10 MG PO TABS: 10.0000 mg | ORAL_TABLET | Freq: Three times a day (TID) | ORAL | Status: DC | PRN

## 2015-06-22 MED ORDER — GLYBURIDE 5 MG PO TABS: 5.0000 mg | ORAL_TABLET | Freq: Two times a day (BID) | ORAL | Status: DC

## 2015-06-22 MED ORDER — PANTOPRAZOLE SODIUM 40 MG PO TBEC: 40.0000 mg | DELAYED_RELEASE_TABLET | Freq: Every day | ORAL | Status: DC

## 2015-06-22 MED ORDER — ATORVASTATIN CALCIUM 40 MG PO TABS: 40.0000 mg | ORAL_TABLET | Freq: Every day | ORAL | Status: DC

## 2015-06-22 MED ORDER — NAPROXEN SODIUM 550 MG PO TABS: 550.0000 mg | ORAL_TABLET | Freq: Two times a day (BID) | ORAL | Status: DC

## 2015-06-22 MED ORDER — GLUCOCOM LANCETS 28G MISC: Status: DC

## 2015-06-22 MED ORDER — GLUCOSE BLOOD VI STRP: ORAL_STRIP | Status: DC

## 2015-06-22 MED ORDER — INSULIN GLARGINE 100 UNIT/ML SOLOSTAR PEN: 45.0000 [IU] | PEN_INJECTOR | Freq: Every day | SUBCUTANEOUS | Status: DC

## 2015-06-22 NOTE — Progress Notes (Signed)
Subjective:  Patient ID: Nicole Villanueva, female    DOB: May 22, 1966  Age: 49 y.o. MRN: [id number]  CC: Shoulder Pain and Medication Refill   HPI Nicole Villanueva presents   History of pain in her left posterior shoulder and the base of her neck. She has pain down her arm. She has no numbness tingling or weakness. She has no history of direct injury or overuse. She also has gastroesophageal reflux disorder needs refills on her medication for that diabetes and high cholesterol. She is tolerating medicine well but her sugars are running a bit high on her insulin  History Nicole Villanueva has a past medical history of Hypertension; Diabetes mellitus; and Hyperlipidemia.   She has past surgical history that includes Cholecystectomy.   Her  family history includes Diabetes in her father and mother.  She   reports that she has never smoked. She has never used smokeless tobacco. She reports that she does not drink alcohol or use illicit drugs.  Outpatient Prescriptions Prior to Visit  Medication Sig Dispense Refill  . atorvastatin (LIPITOR) 40 MG tablet Take 1 tablet (40 mg total) by mouth daily. 90 tablet 3  . GlucoCom Lancets MISC Check blood sugar TID & QHS 100 each 3  . glucose blood (ACCU-CHEK AVIVA PLUS) test strip Use as instructed AC and HS 100 each 12  . glyBURIDE (DIABETA) 5 MG tablet Take 1 tablet (5 mg total) by mouth 2 (two) times daily with a meal. 60 tablet 3  . Insulin Glargine (LANTUS SOLOSTAR) 100 UNIT/ML Solostar Pen Inject 45 Units into the skin daily at 10 pm. 15 mL 5  . Lancet Device MISC Check blood sugar 3x per day 90 each 5  . metFORMIN (GLUCOPHAGE) 1000 MG tablet Take 1 tablet (1,000 mg total) by mouth 2 (two) times daily with a meal. 60 tablet 4  . pantoprazole (PROTONIX) 40 MG tablet Take 1 tablet (40 mg total) by mouth daily. 30 tablet 3  . sertraline (ZOLOFT) 50 MG tablet Take 1 tablet (50 mg total) by mouth daily. (Patient not taking: Reported on  06/22/2015) 30 tablet 3  . benzonatate (TESSALON) 100 MG capsule Take 1 capsule (100 mg total) by mouth 3 (three) times daily as needed for cough. 30 capsule 1  . fluticasone (FLONASE) 50 MCG/ACT nasal spray Place 2 sprays into both nostrils daily. 16 g 3   No facility-administered medications prior to visit.    Social History   Social History  . Marital Status: Single    Spouse Name: N/A  . Number of Children: N/A  . Years of Education: N/A   Social History Main Topics  . Smoking status: Never Smoker   . Smokeless tobacco: Never Used  . Alcohol Use: No  . Drug Use: No  . Sexual Activity: Yes    Birth Control/ Protection: None   Other Topics Concern  . None   Social History Narrative     Review of Systems  Constitutional: Negative for fever, chills and appetite change.  HENT: Negative for congestion, ear pain, postnasal drip, sinus pressure and sore throat.   Eyes: Negative for pain and redness.  Respiratory: Negative for cough, shortness of breath and wheezing.   Cardiovascular: Negative for leg swelling.  Gastrointestinal: Negative for nausea, vomiting, abdominal pain, diarrhea, constipation and blood in stool.  Endocrine: Negative for polyuria.  Genitourinary: Negative for dysuria, urgency, frequency and flank pain.  Musculoskeletal: Positive for neck stiffness. Negative for gait problem.  Skin:  Negative for rash.  Neurological: Negative for weakness and headaches.  Psychiatric/Behavioral: Negative for confusion and decreased concentration. The patient is not nervous/anxious.     Objective:  BP 100/78 mmHg  Pulse 66  Temp(Src) 98.2 F (36.8 C) (Oral)  Resp 16  Ht 4' 11.5" (1.511 m)  Wt 151 lb 12.8 oz (68.856 kg)  BMI 30.16 kg/m2  SpO2 98%  LMP 09/29/2011  Physical Exam  Constitutional: She is oriented to person, place, and time. She appears well-developed and well-nourished.  HENT:  Head: Normocephalic and atraumatic.  Eyes: Conjunctivae are normal.  Pupils are equal, round, and reactive to light.  Pulmonary/Chest: Effort normal.  Musculoskeletal: She exhibits no edema.       Left shoulder: She exhibits tenderness (left posterior shoulder at base of neck).  Neurological: She is alert and oriented to person, place, and time.  Skin: Skin is dry.  Psychiatric: She has a normal mood and affect. Her behavior is normal. Thought content normal.  minimal decrease in grip left hand and flexion extension elbow.    Assessment & Plan:   [name] was seen today for shoulder pain and medication refill.  Diagnoses and all orders for this visit:  Cervical neuritis -     Ambulatory referral to Neurology  Flu vaccine need -     Flu Vaccine QUAD 36+ mos IM  Diabetes mellitus without complication (HCC) -     POCT glucose (manual entry) -     POCT glycosylated hemoglobin (Hb A1C) -     Comprehensive metabolic panel -     Lipid panel -     POCT urinalysis dipstick -     POCT Microscopic Urinalysis ([facility])  Gastroesophageal reflux disease, esophagitis presence not specified  Other orders -     cyclobenzaprine (FLEXERIL) 10 MG tablet; Take 1 tablet (10 mg total) by mouth 3 (three) times daily as needed for muscle spasms. -     naproxen sodium (ANAPROX DS) 550 MG tablet; Take 1 tablet (550 mg total) by mouth 2 (two) times daily with a meal.   I have discontinued Ms. Bravo Fernandez's fluticasone and benzonatate. I am also having her start on cyclobenzaprine and naproxen sodium. Additionally, I am having her maintain her Lancet Device, pantoprazole, sertraline, atorvastatin, glyBURIDE, Insulin Glargine, metFORMIN, GlucoCom Lancets, and glucose blood.  Meds ordered this encounter  Medications  . cyclobenzaprine (FLEXERIL) 10 MG tablet    Sig: Take 1 tablet (10 mg total) by mouth 3 (three) times daily as needed for muscle spasms.    Dispense:  30 tablet    Refill:  0  . naproxen sodium (ANAPROX DS) 550 MG tablet    Sig: Take 1 tablet (550 mg  total) by mouth 2 (two) times daily with a meal.    Dispense:  40 tablet    Refill:  0    Appropriate red flag conditions were discussed with the patient as well as actions that should be taken.  Patient expressed his understanding.  Follow-up: Return in about 3 months (around 09/22/2015), or if symptoms worsen or fail to improve.  Carmelina DaneAnderson, Jeffery S, MD    Results for orders placed or performed in visit on 06/22/15  POCT glucose (manual entry)  Result Value Ref Range   POC Glucose 97 70 - 99 mg/dl  POCT glycosylated hemoglobin (Hb A1C)  Result Value Ref Range   Hemoglobin A1C 8.9   POCT urinalysis dipstick  Result Value Ref Range   Color, UA yellow yellow  Clarity, UA clear clear   Glucose, UA negative negative   Bilirubin, UA negative negative   Ketones, POC UA negative negative   Spec Grav, UA 1.020    Blood, UA negative negative   pH, UA 5.5    Protein Ur, POC negative negative   Urobilinogen, UA 0.2    Nitrite, UA Negative Negative   Leukocytes, UA Negative Negative  POCT Microscopic Urinalysis ([facility])  Result Value Ref Range   WBC,UR,HPF,POC None None WBC/hpf   RBC,UR,HPF,POC None None RBC/hpf   Bacteria None None, Too numerous to count   Mucus Present (A) Absent   Epithelial Cells, UR Per Microscopy Moderate (A) None, Too numerous to count cells/hpf

## 2015-06-22 NOTE — Patient Instructions (Signed)

## 2015-06-27 ENCOUNTER — Encounter: Payer: Self-pay | Admitting: Family Medicine

## 2015-07-03 ENCOUNTER — Ambulatory Visit (INDEPENDENT_AMBULATORY_CARE_PROVIDER_SITE_OTHER): Payer: 59 | Admitting: Internal Medicine

## 2015-07-03 ENCOUNTER — Encounter: Payer: Self-pay | Admitting: Internal Medicine

## 2015-07-03 VITALS — BP 104/60 | HR 91 | Temp 98.3°F | Resp 12 | Ht 60.0 in | Wt 156.2 lb

## 2015-07-03 DIAGNOSIS — Z794 Long term (current) use of insulin: Secondary | ICD-10-CM

## 2015-07-03 DIAGNOSIS — E1165 Type 2 diabetes mellitus with hyperglycemia: Secondary | ICD-10-CM | POA: Diagnosis not present

## 2015-07-03 MED ORDER — GLIPIZIDE 5 MG PO TABS: 5.0000 mg | ORAL_TABLET | Freq: Two times a day (BID) | ORAL | Status: DC

## 2015-07-03 MED ORDER — ALBIGLUTIDE 30 MG ~~LOC~~ PEN: PEN_INJECTOR | SUBCUTANEOUS | Status: DC

## 2015-07-03 NOTE — Progress Notes (Signed)
Patient ID: Nicole Villanueva, female   DOB: 1966-02-16, 49 y.o.   MRN: [id number]  HPI: Nicole Villanueva is a 49 y.o.-year-old female, referred by Dr Norberto SorensonEva Shaw, for management of DM2, dx in ~2001, insulin-dependent since 2008, uncontrolled, without complications.  Last hemoglobin A1c was: Lab Results  Component Value Date   HGBA1C 8.9 06/22/2015   HGBA1C 8.9* 06/22/2015   HGBA1C 7.30 03/08/2015  Before last HbA1c, she was taking half of the Metformin and Glyburide doses.   Pt is now on a regimen of: - Metformin 1000 mg 2x a day, with meals - Glyburide 5 mg twice daily with a meal - Lantus 45 units at bedtime - same dose for 2 years  Pt checks her sugars 1x a day and they are: - am: 95-155 - 2h after b'fast: n/c - before lunch: n/c - 2h after lunch: 170, 220-290 - before dinner: 290s - 2h after dinner: n/c - bedtime: n/c - nighttime: n/c No lows. Lowest sugar was 95; she has hypoglycemia awareness at 70-80s.  Highest sugar was 250.  Glucometer: Accu-Chek Aviva  Pt's meals are: - Breakfast: 1 poached egg + banana , no bread; but mostly smoothie with almond milk + strawberries + protein powder; or skips - Lunch: meat + veggies + starch, occasionally dessert, fruit - Dinner: meat + veggies + starch, occasionally dessert - Snacks: no Drinks unsweet tea, water  - no CKD, last BUN/creatinine:  Lab Results  Component Value Date   BUN 9 06/22/2015   CREATININE 0.43* 06/22/2015  She was on Lisinopril before >> hypotension >> stopped. - last set of lipids: Lab Results  Component Value Date   CHOL 117* 06/22/2015   HDL 38* 06/22/2015   LDLCALC 51 06/22/2015   TRIG 138 06/22/2015   CHOLHDL 3.1 06/22/2015  She is on atorvastatin. - last eye exam was in 03/2015. No DR. + small cataract L eye.  - no numbness and tingling in her feet.  Pt has FH of DM in M, F, 1 B, 2 S.  She also has HL.   ROS: Constitutional: + weight gain, + fatigue, no subjective  hyperthermia/hypothermia, + poor sleep Eyes: + blurry vision, no xerophthalmia ENT: no sore throat, no nodules palpated in throat, no dysphagia/odynophagia, no hoarseness, + decreased hearing Cardiovascular: no CP/+ SOB/+ palpitations/no leg swelling Respiratory: no cough/+ SOB Gastrointestinal: + N/no V/D/+ C/+ acid reflux Musculoskeletal:+ muscle/+ joint aches (L shoulder pain) Skin: no rashes, + hair loss Neurological: no tremors/numbness/tingling/dizziness Psychiatric: + depression/no anxiety  Past Medical History  Diagnosis Date  . Hypertension   . Diabetes mellitus   . Hyperlipidemia    Past Surgical History  Procedure Laterality Date  . Cholecystectomy     Social History   Social History  . Marital Status: Single, separated    Spouse Name: N/A  . Number of Children: 2   Occupational History  .    Social History Main Topics  . Smoking status: Never Smoker   . Smokeless tobacco: Never Used  . Alcohol Use: No  . Drug Use: No   Current Outpatient Prescriptions on File Prior to Visit  Medication Sig Dispense Refill  . atorvastatin (LIPITOR) 40 MG tablet Take 1 tablet (40 mg total) by mouth daily. 90 tablet 3  . cyclobenzaprine (FLEXERIL) 10 MG tablet Take 1 tablet (10 mg total) by mouth 3 (three) times daily as needed for muscle spasms. 30 tablet 0  . GlucoCom Lancets MISC Check blood sugar TID &  QHS 100 each 3  . glyBURIDE (DIABETA) 5 MG tablet Take 1 tablet (5 mg total) by mouth 2 (two) times daily with a meal. 60 tablet 3  . Insulin Glargine (LANTUS SOLOSTAR) 100 UNIT/ML Solostar Pen Inject 45 Units into the skin daily at 10 pm. 15 mL 5  . Lancet Device MISC Check blood sugar 3x per day 90 each 5  . metFORMIN (GLUCOPHAGE) 1000 MG tablet Take 1 tablet (1,000 mg total) by mouth 2 (two) times daily with a meal. 60 tablet 4  . naproxen sodium (ANAPROX DS) 550 MG tablet Take 1 tablet (550 mg total) by mouth 2 (two) times daily with a meal. 40 tablet 0  . pantoprazole  (PROTONIX) 40 MG tablet Take 1 tablet (40 mg total) by mouth daily. 30 tablet 3   No current facility-administered medications on file prior to visit.   No Known Allergies Family History  Problem Relation Age of Onset  . Diabetes Mother   . Diabetes Father    PE: BP 104/60 mmHg  Pulse 91  Temp(Src) 98.3 F (36.8 C) (Oral)  Resp 12  Ht 5' (1.524 m)  Wt 156 lb 3.2 oz (70.852 kg)  BMI 30.51 kg/m2  SpO2 96%  LMP 09/29/2011 Wt Readings from Last 3 Encounters:  07/03/15 156 lb 3.2 oz (70.852 kg)  06/22/15 151 lb 12.8 oz (68.856 kg)  03/08/15 159 lb (72.122 kg)   Constitutional: overweight, in NAD Eyes: PERRLA, EOMI, no exophthalmos ENT: moist mucous membranes, no thyromegaly, no cervical lymphadenopathy Cardiovascular: RRR, No MRG Respiratory: CTA B Gastrointestinal: abdomen soft, NT, ND, BS+ Musculoskeletal: no deformities, strength intact in all 4 Skin: moist, warm, no rashes Neurological: no tremor with outstretched hands, DTR normal in all 4  ASSESSMENT: 1. DM2, insulin-dependent, uncontrolled, without complications  PLAN:  1. Patient with long-standing, uncontrolled diabetes, on oral antidiabetic regimen + basal insulin, which became insufficient. She only took half of the oral med doses before last HbA1c returned high. However, nw on full doses, the sugars are higher than goal, especially later in the day. We discussed about adding a GLP1 R agonist >> will try to start Tanzeum. She has thyroid cancer in a cousin - ? Type >> advised her to ask him and let me know. No h/o pancreatitis. - will also switch from Glyburide to Glipizide b/c the first reduces cardiac ischemic preconditioning - I suggested to:  Patient Instructions  Please continue: - Metformin 1000 mg 2x a day - Lantus 45 units at bedtime  Stop Glyburide.  Start Glipizide 5 mg 2x a day, 15-20 min before b'fast and before supper. If you skin b'fast, take the first dose before lunch.  Start Tanzeum 30 mg  every week.  Please call me after 3 weeks to see if we need to increase the dose of Tanzeum.  Please let me know if the sugars are consistently <80 or >200.  Please return in 1.5 months with your sugar log.   - Strongly advised her to start checking sugars at different times of the day - check 2 times a day, rotating checks - given sugar log and advised how to fill it and to bring it at next appt  - given foot care handout and explained the principles  - given instructions for hypoglycemia management "15-15 rule"  - advised for yearly eye exams >> she is UTD - she had the flu shot this season - Return to clinic in 1.5 mo with sugar log

## 2015-07-03 NOTE — Patient Instructions (Signed)
Please continue: - Metformin 1000 mg 2x a day - Lantus 45 units at bedtime  Stop Glyburide.  Start Glipizide 5 mg 2x a day, 15-20 min before b'fast and before supper. If you skin b'fast, take the first dose before lunch.  Start Tanzeum 30 mg every week.  Please call me after 3 weeks to see if we need to increase the dose of Tanzeum.  Please let me know if the sugars are consistently <80 or >200.  Please return in 1.5 months with your sugar log.   PATIENT INSTRUCTIONS FOR TYPE 2 DIABETES:  **Please join MyChart!** - see attached instructions about how to join if you have not done so already.  DIET AND EXERCISE Diet and exercise is an important part of diabetic treatment.  We recommended aerobic exercise in the form of brisk walking (working between 40-60% of maximal aerobic capacity, similar to brisk walking) for 150 minutes per week (such as 30 minutes five days per week) along with 3 times per week performing 'resistance' training (using various gauge rubber tubes with handles) 5-10 exercises involving the major muscle groups (upper body, lower body and core) performing 10-15 repetitions (or near fatigue) each exercise. Start at half the above goal but build slowly to reach the above goals. If limited by weight, joint pain, or disability, we recommend daily walking in a swimming pool with water up to waist to reduce pressure from joints while allow for adequate exercise.    BLOOD GLUCOSES Monitoring your blood glucoses is important for continued management of your diabetes. Please check your blood glucoses 2-4 times a day: fasting, before meals and at bedtime (you can rotate these measurements - e.g. one day check before the 3 meals, the next day check before 2 of the meals and before bedtime, etc.).   HYPOGLYCEMIA (low blood sugar) Hypoglycemia is usually a reaction to not eating, exercising, or taking too much insulin/ other diabetes drugs.  Symptoms include tremors, sweating, hunger,  confusion, headache, etc. Treat IMMEDIATELY with 15 grams of Carbs: . 4 glucose tablets .  cup regular juice/soda . 2 tablespoons raisins . 4 teaspoons sugar . 1 tablespoon honey Recheck blood glucose in 15 mins and repeat above if still symptomatic/blood glucose <100.  RECOMMENDATIONS TO REDUCE YOUR RISK OF DIABETIC COMPLICATIONS: * Take your prescribed MEDICATION(S) * Follow a DIABETIC diet: Complex carbs, fiber rich foods, (monounsaturated and polyunsaturated) fats * AVOID saturated/trans fats, high fat foods, >2,300 mg salt per day. * EXERCISE at least 5 times a week for 30 minutes or preferably daily.  * DO NOT SMOKE OR DRINK more than 1 drink a day. * Check your FEET every day. Do not wear tightfitting shoes. Contact us if you develop an ulcer * See your EYE doctor once a year or more if needed * Get a FLU shot once a year * Get a PNEUMONIA vaccine once before and once after age 35 years  GOALS:  * Your Hemoglobin A1c of <7%  * fasting sugars need to be <130 * after meals sugars need to be <180 (2h after you start eating) * Your Systolic BP should be 140 or lower  * Your Diastolic BP should be 80 or lower  * Your HDL (Good Cholesterol) should be 40 or higher  * Your LDL (Bad Cholesterol) should be 100 or lower. * Your Triglycerides should be 150 or lower  * Your Urine microalbumin (kidney function) should be <30 * Your Body Mass Index should be 25 or lower  Please consider the following ways to cut down carbs and fat and increase fiber and micronutrients in your diet: - substitute whole grain for white bread or pasta - substitute brown rice for white rice - substitute 90-calorie flat bread pieces for slices of bread when possible - substitute sweet potatoes or yams for white potatoes - substitute humus for margarine - substitute tofu for cheese when possible - substitute almond or rice milk for regular milk (would not drink soy milk daily due to concern for soy  estrogen influence on breast cancer risk) - substitute dark chocolate for other sweets when possible - substitute water - can add lemon or orange slices for taste - for diet sodas (artificial sweeteners will trick your body that you can eat sweets without getting calories and will lead you to overeating and weight gain in the long run) - do not skip breakfast or other meals (this will slow down the metabolism and will result in more weight gain over time)  - can try smoothies made from fruit and almond/rice milk in am instead of regular breakfast - can also try old-fashioned (not instant) oatmeal made with almond/rice milk in am - order the dressing on the side when eating salad at a restaurant (pour less than half of the dressing on the salad) - eat as little meat as possible - can try juicing, but should not forget that juicing will get rid of the fiber, so would alternate with eating raw veg./fruits or drinking smoothies - use as little oil as possible, even when using olive oil - can dress a salad with a mix of balsamic vinegar and lemon juice, for e.g. - use agave nectar, stevia sugar, or regular sugar rather than artificial sweateners - steam or broil/roast veggies  - snack on veggies/fruit/nuts (unsalted, preferably) when possible, rather than processed foods - reduce or eliminate aspartame in diet (it is in diet sodas, chewing gum, etc) Read the labels!  Try to read Dr. Katherina RightNeal Barnard's book: "Program for Reversing Diabetes" for other ideas for healthy eating.

## 2015-07-16 ENCOUNTER — Ambulatory Visit (INDEPENDENT_AMBULATORY_CARE_PROVIDER_SITE_OTHER): Payer: 59 | Admitting: Family Medicine

## 2015-07-16 VITALS — BP 110/72 | HR 102 | Temp 98.5°F | Resp 18 | Ht 60.5 in | Wt 159.0 lb

## 2015-07-16 DIAGNOSIS — IMO0001 Reserved for inherently not codable concepts without codable children: Secondary | ICD-10-CM

## 2015-07-16 DIAGNOSIS — N814 Uterovaginal prolapse, unspecified: Secondary | ICD-10-CM

## 2015-07-16 DIAGNOSIS — Z794 Long term (current) use of insulin: Secondary | ICD-10-CM

## 2015-07-16 DIAGNOSIS — M75102 Unspecified rotator cuff tear or rupture of left shoulder, not specified as traumatic: Secondary | ICD-10-CM | POA: Diagnosis not present

## 2015-07-16 DIAGNOSIS — E1165 Type 2 diabetes mellitus with hyperglycemia: Secondary | ICD-10-CM

## 2015-07-16 NOTE — Patient Instructions (Signed)
Ask your Dr. if she thinks Invokana would be helpful

## 2015-07-16 NOTE — Progress Notes (Signed)
This chart was scribed for Elvina Sidle, MD by Stann Ore, medical scribe at [facility].The patient was seen in exam room 8 and the patient's care was started at 1:28 PM.  Patient ID: Nicole Villanueva MRN: [id number], DOB: 1965-08-29, 49 y.o. Date of Encounter: 07/16/2015  Primary Physician: Doris Cheadle, MD  Chief Complaint:  Chief Complaint  Patient presents with   Referral    surgery was canceled today due high glucose   Medication Refill    testing strips tid    HPI:  Nicole Villanueva is a 49 y.o. female who presents to [facility] for referral.   Surgery She also wants surgery referral as her prolapse surgery was cancelled today due to her sugar being too high. When she had her pap smear 2-3 years ago, she was referred to a specialist. Today, she was trying to talk to the financial counselor, and was asked to get a referral. Pt saw Dr. Elvera Lennox 2 weeks ago but couldn't get a referral because she doesn't do primary care. Pt will see Dr. Elvera Lennox again tomorrow. She denies urinary incontinence.   Her morning sugar readings are around 135. She takes lantus around lunch. And then, at night time, her sugar is around 194. Surgery was cancelled because her a1c was 9.4 today. She's trying to eat better but her sugar still goes up and down. In the past 2 weeks, she's gained weight around 2-3 lbs a week.   Vision  She had her eyes checked and she sees more flurries in her vision.   Left Shoulder She also notes that her left shoulder hurts and can't lift her arms above her head. She's taken naproxen but only for temporary mild relief.   Past Medical History  Diagnosis Date   Hypertension    Diabetes mellitus    Hyperlipidemia      Home Meds: Prior to Admission medications   Medication Sig Start Date End Date Taking? Authorizing Provider  Albiglutide (TANZEUM) 30 MG PEN Inject 30 mg once a week under skin. 07/03/15    Carlus Pavlov, MD  atorvastatin (LIPITOR) 40 MG tablet Take 1 tablet (40 mg total) by mouth daily. 06/22/15   Carmelina Dane, MD  cyclobenzaprine (FLEXERIL) 10 MG tablet Take 1 tablet (10 mg total) by mouth 3 (three) times daily as needed for muscle spasms. 06/22/15   Carmelina Dane, MD  glipiZIDE (GLUCOTROL) 5 MG tablet Take 1 tablet (5 mg total) by mouth 2 (two) times daily before a meal. 07/03/15   Carlus Pavlov, MD  GlucoCom Lancets MISC Check blood sugar TID & QHS 06/22/15   Carmelina Dane, MD  glucose blood (ACCU-CHEK COMPACT PLUS) test strip 1 each by Other route as needed for other. Use to test blood sugar 1-2 times as instructed.    Historical Provider, MD  Insulin Glargine (LANTUS SOLOSTAR) 100 UNIT/ML Solostar Pen Inject 45 Units into the skin daily at 10 pm. 06/22/15   Carmelina Dane, MD  Lancet Device MISC Check blood sugar 3x per day 12/21/13   Daine Gip, MD  metFORMIN (GLUCOPHAGE) 1000 MG tablet Take 1 tablet (1,000 mg total) by mouth 2 (two) times daily with a meal. 06/22/15   Carmelina Dane, MD  naproxen sodium (ANAPROX DS) 550 MG tablet Take 1 tablet (550 mg total) by mouth 2 (two) times daily with a meal. 06/22/15 06/21/16  Carmelina Dane, MD  pantoprazole (PROTONIX) 40  MG tablet Take 1 tablet (40 mg total) by mouth daily. 06/22/15   Carmelina DaneJeffery S Anderson, MD    Allergies: No Known Allergies  Social History   Social History   Marital Status: Single    Spouse Name: N/A   Number of Children: N/A   Years of Education: N/A   Occupational History   Not on file.   Social History Main Topics   Smoking status: Never Smoker    Smokeless tobacco: Never Used   Alcohol Use: No   Drug Use: No   Sexual Activity: Yes    CopyBirth Control/ Protection: None   Other Topics Concern   Not on file   Social History Narrative     Review of Systems: Constitutional: negative for fever, chills, night sweats, weight changes, or fatigue  HEENT:  negative for hearing loss, congestion, rhinorrhea, ST, epistaxis, or sinus pressure; positive for visual changes Cardiovascular: negative for chest pain or palpitations Respiratory: negative for hemoptysis, wheezing, shortness of breath, or cough Abdominal: negative for abdominal pain, nausea, vomiting, diarrhea, or constipation Dermatological: negative for rash Neurologic: negative for headache, dizziness, or syncope Musc: positive for arthralgia (left shoulder)  All other systems reviewed and are otherwise negative with the exception to those above and in the HPI.  Physical Exam: Blood pressure 110/72, pulse 102, temperature 98.5 F (36.9 C), temperature source Oral, resp. rate 18, height 5' 0.5" (1.537 m), weight 159 lb (72.122 kg), last menstrual period 09/29/2011, SpO2 98 %., Body mass index is 30.53 kg/(m^2). General: Well developed, well nourished, in no acute distress. Head: Normocephalic, atraumatic, eyes without discharge, sclera non-icteric, nares are without discharge. Bilateral auditory canals clear, TM's are without perforation, pearly grey and translucent with reflective cone of light bilaterally. Oral cavity moist, posterior pharynx without exudate, erythema, peritonsillar abscess, or post nasal drip.  Neck: Supple. No thyromegaly. Full ROM. No lymphadenopathy. Lungs: Clear bilaterally to auscultation without wheezes, rales, or rhonchi. Breathing is unlabored. Heart: RRR with S1 S2. No murmurs, rubs, or gallops appreciated. Msk:  Strength and tone normal for age. Extremities/Skin: Warm and dry. No clubbing or cyanosis. No edema. No rashes or suspicious lesions. Pain with abduction of left shoulder, internal rotation. She has tenderness in the anterior joint line Neuro: Alert and oriented X 3. Moves all extremities spontaneously. Gait is normal. CNII-XII grossly in tact. Psych:  Responds to questions appropriately with a normal affect.   ASSESSMENT AND PLAN:  49 y.o. year old  female with  This chart was scribed in my presence and reviewed by me personally.    ICD-9-CM ICD-10-CM   1. Uterine prolapse 618.1 N81.4 Ambulatory referral to Gynecology  2. Uncontrolled type 2 diabetes mellitus without complication, with long-term current use of insulin (HCC) 250.02 E11.65    V58.67 Z79.4   3. Rotator cuff syndrome, left 726.10 M75.102 Ambulatory referral to Orthopedic Surgery     Signed, Elvina SidleKurt Lauenstein, MD    By signing my name below, I, Stann Oresung-Kai Tsai, attest that this documentation has been prepared under the direction and in the presence of Elvina SidleKurt Lauenstein, MD. Electronically Signed: Stann Oresung-Kai Tsai, Scribe. 07/16/2015 , 1:28 PM .  Signed, Elvina SidleKurt Lauenstein, MD 07/16/2015 1:28 PM

## 2015-07-23 ENCOUNTER — Telehealth: Payer: Self-pay | Admitting: Internal Medicine

## 2015-07-23 NOTE — Telephone Encounter (Signed)
Patient stated with the new medication her b/s is still running high, 100 to 296, please advise

## 2015-07-23 NOTE — Telephone Encounter (Signed)
Called pt and lvm advising her to call back to let us know what times her b/s is running high.

## 2015-08-09 ENCOUNTER — Other Ambulatory Visit: Payer: Self-pay

## 2015-08-09 MED ORDER — GLUCOSE BLOOD VI STRP: 1.0000 | ORAL_STRIP | Status: DC | PRN

## 2015-08-17 ENCOUNTER — Ambulatory Visit: Payer: 59 | Admitting: Internal Medicine

## 2015-08-21 ENCOUNTER — Encounter: Payer: Self-pay | Admitting: Internal Medicine

## 2015-08-21 ENCOUNTER — Ambulatory Visit (INDEPENDENT_AMBULATORY_CARE_PROVIDER_SITE_OTHER): Payer: 59 | Admitting: Internal Medicine

## 2015-08-21 VITALS — BP 114/68 | HR 97 | Temp 97.9°F | Resp 12 | Wt 158.0 lb

## 2015-08-21 DIAGNOSIS — Z794 Long term (current) use of insulin: Secondary | ICD-10-CM

## 2015-08-21 DIAGNOSIS — E1165 Type 2 diabetes mellitus with hyperglycemia: Secondary | ICD-10-CM | POA: Diagnosis not present

## 2015-08-21 MED ORDER — ALBIGLUTIDE 50 MG ~~LOC~~ PEN: PEN_INJECTOR | SUBCUTANEOUS | Status: DC

## 2015-08-21 NOTE — Patient Instructions (Signed)
You are cleared for surgery from the diabetes point of view.  Please continue: - Metformin 1000 mg 2x a day - Lantus 45 units at bedtime - Glipizide 5 mg 2x a day before b'fast and before supper. If you skin b'fast, take the first dose before lunch.  Please increase: - Tanzeum to 50 mg every week.  Please return in 2 months with your sugar log.

## 2015-08-21 NOTE — Progress Notes (Signed)
Patient ID: Nicole Villanueva, female   DOB: 09/26/1965, 49 y.o.   MRN: [id number]  HPI: Nicole Villanueva is a 49 y.o.-year-old female, returning for f/u for DM2, dx in ~2001, insulin-dependent since 2008, uncontrolled, without complications. Last visit 1.5 mo ago.  Last hemoglobin A1c was: Lab Results  Component Value Date   HGBA1C 8.9 06/22/2015   HGBA1C 8.9* 06/22/2015   HGBA1C 7.30 03/08/2015  Before last HbA1c, she was taking half of the Metformin and Glyburide doses.   Pt was on a regimen of: - Metformin 1000 mg 2x a day, with meals - Glyburide 5 mg twice daily with a meal - Lantus 45 units at bedtime - same dose for 2 years  At last visit, we changed to: - Metformin 1000 mg 2x a day - Lantus 45 units at bedtime - Glipizide 5 mg 2x a day, 15-20 min before b'fast and before supper. If skip b'fast, take the first dose before lunch. - Tanzeum 30 mg every week.  Pt checks her sugars 1x a day and they are: - am: 95-155 >> 105-156, 188 - 2h after b'fast: n/c - before lunch: n/c >> 99  - 2h after lunch: 170, 220-290 >> 65 - before dinner: 290s >> 166 - 2h after dinner: n/c - bedtime: n/c >> 134-210 - nighttime: n/c No lows. Lowest sugar was 95 >> 65x1; she has hypoglycemia awareness at 70-80s.  Highest sugar was 250 >> 253.  Glucometer: Accu-Chek Aviva  Pt's meals are: - Breakfast: 1 poached egg + banana , no bread; but mostly smoothie with almond milk + strawberries + protein powder; or skips - Lunch: meat + veggies + starch, occasionally dessert, fruit - Dinner: meat + veggies + starch, occasionally dessert - Snacks: no Drinks unsweet tea, water  - no CKD, last BUN/creatinine:  Lab Results  Component Value Date   BUN 9 06/22/2015   CREATININE 0.43* 06/22/2015  She was on Lisinopril before >> hypotension >> stopped. - last set of lipids: Lab Results  Component Value Date   CHOL 117* 06/22/2015   HDL 38* 06/22/2015   LDLCALC 51 06/22/2015   TRIG  138 06/22/2015   CHOLHDL 3.1 06/22/2015  She is on atorvastatin. - last eye exam was in 03/2015. No DR. + small cataract L eye.  - no numbness and tingling in her feet.  She also has HL.   ROS: Constitutional: no weight gain/loss, + fatigue, no subjective hyperthermia/hypothermia Eyes: no blurry vision, no xerophthalmia ENT: no sore throat, no nodules palpated in throat, no dysphagia/odynophagia, no hoarseness Cardiovascular: no CP/SOB/palpitations/leg swelling Respiratory: no cough/SOB Gastrointestinal: no N/V/D/C Musculoskeletal: no muscle/joint aches Skin: no rashes Neurological: no tremors/numbness/tingling/dizziness  I reviewed pt's medications, allergies, PMH, social hx, family hx, and changes were documented in the history of present illness. Otherwise, unchanged from my initial visit note.  Past Medical History  Diagnosis Date  . Hypertension   . Diabetes mellitus   . Hyperlipidemia    Past Surgical History  Procedure Laterality Date  . Cholecystectomy     Social History   Social History  . Marital Status: Single, separated    Spouse Name: N/A  . Number of Children: 2   Occupational History  .    Social History Main Topics  . Smoking status: Never Smoker   . Smokeless tobacco: Never Used  . Alcohol Use: No  . Drug Use: No   Current Outpatient Prescriptions on File Prior to Visit  Medication Sig Dispense  Refill  . Albiglutide (TANZEUM) 30 MG PEN Inject 30 mg once a week under skin. 4 each 1  . atorvastatin (LIPITOR) 40 MG tablet Take 1 tablet (40 mg total) by mouth daily. 90 tablet 3  . cyclobenzaprine (FLEXERIL) 10 MG tablet Take 1 tablet (10 mg total) by mouth 3 (three) times daily as needed for muscle spasms. 30 tablet 0  . glipiZIDE (GLUCOTROL) 5 MG tablet Take 1 tablet (5 mg total) by mouth 2 (two) times daily before a meal. 60 tablet 3  . GlucoCom Lancets MISC Check blood sugar TID & QHS 100 each 3  . glucose blood (ACCU-CHEK COMPACT PLUS) test strip  1 each by Other route as needed for other. Test blood sugar 3 times daily. Dx: E11.65 300 each 3  . Insulin Glargine (LANTUS SOLOSTAR) 100 UNIT/ML Solostar Pen Inject 45 Units into the skin daily at 10 pm. 15 mL 5  . Lancet Device MISC Check blood sugar 3x per day 90 each 5  . metFORMIN (GLUCOPHAGE) 1000 MG tablet Take 1 tablet (1,000 mg total) by mouth 2 (two) times daily with a meal. 60 tablet 4  . naproxen sodium (ANAPROX DS) 550 MG tablet Take 1 tablet (550 mg total) by mouth 2 (two) times daily with a meal. 40 tablet 0  . pantoprazole (PROTONIX) 40 MG tablet Take 1 tablet (40 mg total) by mouth daily. 30 tablet 3   No current facility-administered medications on file prior to visit.   No Known Allergies Family History  Problem Relation Age of Onset  . Diabetes Mother   . Diabetes Father    PE: BP 114/68 mmHg  Pulse 97  Temp(Src) 97.9 F (36.6 C) (Oral)  Resp 12  Wt 158 lb (71.668 kg)  SpO2 98%  LMP 09/29/2011 Body mass index is 30.34 kg/(m^2). Wt Readings from Last 3 Encounters:  08/21/15 158 lb (71.668 kg)  07/16/15 159 lb (72.122 kg)  07/03/15 156 lb 3.2 oz (70.852 kg)   Constitutional: overweight, in NAD Eyes: PERRLA, EOMI, no exophthalmos ENT: moist mucous membranes, no thyromegaly, no cervical lymphadenopathy Cardiovascular: RRR, No MRG Respiratory: CTA B Gastrointestinal: abdomen soft, NT, ND, BS+ Musculoskeletal: no deformities, strength intact in all 4 Skin: moist, warm, no rashes Neurological: no tremor with outstretched hands, DTR normal in all 4  ASSESSMENT: 1. DM2, insulin-dependent, uncontrolled, without complications  PLAN:  1. Patient with long-standing, uncontrolled diabetes, on oral antidiabetic regimen + basal insulin, to which we added a GLP1 R agonist (Tanzeum) at last visit. We also switched from Glyburide to Glipizide b/c the first reduces cardiac ischemic preconditioning She has thyroid cancer in a cousin - ? Type >> advised her to ask him and  let me know. No h/o pancreatitis. - sugars much better in the second part of the day, but still above goal >> will increase Tanzeum. - I suggested to:  Patient Instructions  You are cleared for surgery from the diabetes point of view.  Please continue: - Metformin 1000 mg 2x a day - Lantus 45 units at bedtime - Glipizide 5 mg 2x a day before b'fast and before supper. If you skin b'fast, take the first dose before lunch.  Please increase: - Tanzeum to 50 mg every week.  Please return in 2 months with your sugar log.   - Strongly advised her to start checking sugars at different times of the day - check 2 times a day, rotating checks - advised for yearly eye exams >> she is UTD -  she had the flu shot this season - reviewed last HbA1c >> 8.9%, but not yet time for another level; since she has improved sugars >> she is cleared for her uterine prolaps surgery in 08/2014 [facility] - Return to clinic in 2 mo with sugar log

## 2015-09-11 ENCOUNTER — Ambulatory Visit (INDEPENDENT_AMBULATORY_CARE_PROVIDER_SITE_OTHER): Payer: 59 | Admitting: Family Medicine

## 2015-09-11 VITALS — BP 112/84 | HR 93 | Temp 98.1°F | Resp 18 | Ht 60.0 in | Wt 159.6 lb

## 2015-09-11 DIAGNOSIS — J209 Acute bronchitis, unspecified: Secondary | ICD-10-CM | POA: Diagnosis not present

## 2015-09-11 DIAGNOSIS — R1084 Generalized abdominal pain: Secondary | ICD-10-CM

## 2015-09-11 MED ORDER — AMOXICILLIN-POT CLAVULANATE 875-125 MG PO TABS: 1.0000 | ORAL_TABLET | Freq: Two times a day (BID) | ORAL | Status: DC

## 2015-09-11 MED ORDER — HYDROCODONE-HOMATROPINE 5-1.5 MG/5ML PO SYRP
5.0000 mL | ORAL_SOLUTION | Freq: Three times a day (TID) | ORAL | Status: DC | PRN
Start: 2015-09-11 — End: 2016-01-01

## 2015-09-11 NOTE — Progress Notes (Signed)
Subjective:  This chart was scribed for  Elvina Sidle MD,  by Veverly Fells, at [facility].  This patient was seen in room  12 and the patient's care was started at 12:41dof PM.    Patient ID: Nicole Villanueva, female    DOB: [birth year], 50 y.o.   MRN: [id number] Chief Complaint  Patient presents with  . Cough    x friday  . chest congestion    HPI HPI Comments: Nicole Villanueva is a 50 y.o. female who presents to the [facility] complaining of a productive cough onset four days ago with associated symptoms of an intermittent fever.  She also states that her gums felt itchy yesterday.  Patient states that her sugar today was 174, yesterday was 168 but it has been overall stable.  She has not taken any medication today. She was not able to go to work yesterday and would like a note.   Abdominal pain/ distension- She notes that recently she has been having more left sided abdominal pain and distension.  She has had this issue for a couple of years but has noticed it more the past couple of weeks.  Her pain is worse after eating. Patient had a cholecystectomy 8 years ago.   Patient works in a company as a Environmental health practitioner.    Patient Active Problem List   Diagnosis Date Noted  . Prolapse of female pelvic organs 06/09/2014  . Other and unspecified hyperlipidemia 03/29/2014  . Depressive disorder, not elsewhere classified 03/29/2014  . Hot flashes 03/29/2014  . Esophageal reflux 03/29/2014  . Vasovagal syndrome 03/09/2014  . Type 2 diabetes mellitus with hyperglycemia (HCC) 03/09/2014  . HTN (hypertension) 03/12/2012  . Diabetes mellitus (HCC) 03/12/2012   Past Medical History  Diagnosis Date  . Hypertension   . Diabetes mellitus   . Hyperlipidemia    Past Surgical History  Procedure Laterality Date  . Cholecystectomy     No Known Allergies Prior to Admission medications   Medication Sig Start Date End Date  Taking? Authorizing Provider  atorvastatin (LIPITOR) 40 MG tablet Take 1 tablet (40 mg total) by mouth daily. 06/22/15  Yes Carmelina Dane, MD  glipiZIDE (GLUCOTROL) 5 MG tablet Take 1 tablet (5 mg total) by mouth 2 (two) times daily before a meal. 07/03/15  Yes Carlus Pavlov, MD  GlucoCom Lancets MISC Check blood sugar TID & QHS 06/22/15  Yes Carmelina Dane, MD  glucose blood (ACCU-CHEK COMPACT PLUS) test strip 1 each by Other route as needed for other. Test blood sugar 3 times daily. Dx: E11.65 08/09/15  Yes Carmelina Dane, MD  Insulin Glargine (LANTUS SOLOSTAR) 100 UNIT/ML Solostar Pen Inject 45 Units into the skin daily at 10 pm. 06/22/15  Yes Carmelina Dane, MD  Lancet Device MISC Check blood sugar 3x per day 12/21/13  Yes Daine Gip, MD  metFORMIN (GLUCOPHAGE) 1000 MG tablet Take 1 tablet (1,000 mg total) by mouth 2 (two) times daily with a meal. 06/22/15  Yes Carmelina Dane, MD  naproxen sodium (ANAPROX DS) 550 MG tablet Take 1 tablet (550 mg total) by mouth 2 (two) times daily with a meal. 06/22/15 06/21/16 Yes Carmelina Dane, MD  pantoprazole (PROTONIX) 40 MG tablet Take 1 tablet (40 mg total) by mouth daily. 06/22/15  Yes Carmelina Dane, MD  Albiglutide 50 MG PEN Inject 50 mg once a week under skin. Patient not taking: Reported  on 09/11/2015 08/21/15   Carlus Pavlov, MD  cyclobenzaprine (FLEXERIL) 10 MG tablet Take 1 tablet (10 mg total) by mouth 3 (three) times daily as needed for muscle spasms. Patient not taking: Reported on 09/11/2015 06/22/15   Carmelina Dane, MD   Social History   Social History  . Marital Status: Single    Spouse Name: N/A  . Number of Children: N/A  . Years of Education: N/A   Occupational History  . Not on file.   Social History Main Topics  . Smoking status: Never Smoker   . Smokeless tobacco: Never Used  . Alcohol Use: No  . Drug Use: No  . Sexual Activity: Yes    Birth Control/ Protection: None   Other Topics  Concern  . Not on file   Social History Narrative      Review of Systems  Constitutional: Positive for fever. Negative for chills.  HENT: Positive for congestion.   Eyes: Negative for pain, redness and itching.  Respiratory: Positive for cough. Negative for choking and shortness of breath.   Gastrointestinal: Positive for abdominal pain. Negative for nausea and vomiting.  Musculoskeletal: Negative for neck pain and neck stiffness.  Skin: Negative for color change and rash.       Objective:   Physical Exam  Constitutional: She is oriented to person, place, and time. She appears well-developed and well-nourished. No distress.  HENT:  Head: Normocephalic and atraumatic.  Eyes: Pupils are equal, round, and reactive to light.  Neck: Normal range of motion.  Pulmonary/Chest: Effort normal. No respiratory distress.  Abdominal: There is tenderness.  Diffusely tender in her abdomen.   Musculoskeletal: Normal range of motion.  Neurological: She is alert and oriented to person, place, and time.  Skin: Skin is warm and dry.  Psychiatric: She has a normal mood and affect. Her behavior is normal.   Coarse breath sounds Filed Vitals:   09/11/15 1227  BP: 112/84  Pulse: 93  Temp: 98.1 F (36.7 C)  TempSrc: Oral  Resp: 18  Height: 5' (1.524 m)  Weight: 159 lb 9.6 oz (72.394 kg)  SpO2: 98%         Assessment & Plan:   This chart was scribed in my presence and reviewed by me personally.    ICD-9-CM ICD-10-CM   1. Acute bronchitis, unspecified organism 466.0 J20.9 amoxicillin-clavulanate (AUGMENTIN) 875-125 MG tablet     HYDROcodone-homatropine (HYCODAN) 5-1.5 MG/5ML syrup  2. Generalized abdominal discomfort 789.07 R10.84 Ambulatory referral to Gastroenterology     Signed, Elvina Sidle, MD

## 2015-09-25 ENCOUNTER — Telehealth: Payer: Self-pay | Admitting: Internal Medicine

## 2015-09-25 NOTE — Telephone Encounter (Signed)
Nicole Villanueva from Orthopedic surgeon called regarding Nicole Villanueva medical clearance She would like to speak with Carollee Herter    Thank you

## 2015-09-25 NOTE — Telephone Encounter (Signed)
Patient sugars have improved greatly after the last hemoglobin A1c. I already told the patient and gave her written clearance from the diabetes point of view. This morning, I also filled out a clearance request (given back to you). From my point of view, she is clear for surgery. We can definitely check another hemoglobin A1c if there threshold of 8.0% is very strict.

## 2015-09-25 NOTE — Telephone Encounter (Signed)
Returned call to [employer] with [employer]. She stated they could see pt's HgBA1c in Epic. Physician wants it to be 8.0 or lower. Patient is supposed to contact Dr Elvera Lennox prior to her appt on 2/27 to discuss ways to improve her A1c. Please advise if you would like for pt to be seen earlier. I will try to schedule pt. Thank you.

## 2015-09-25 NOTE — Telephone Encounter (Signed)
Kelly from [facility] said their threshold is strict. Please advise.

## 2015-09-25 NOTE — Telephone Encounter (Signed)
Let's call her back for a HbA1c.

## 2015-09-27 NOTE — Telephone Encounter (Signed)
LVM for pt to call back as soon as possible.   RE: appt to recheck a1c

## 2015-09-27 NOTE — Telephone Encounter (Signed)
Pt scheduled for 10/22/2015 for her follow up appointment.

## 2015-10-22 ENCOUNTER — Ambulatory Visit: Payer: 59 | Admitting: Internal Medicine

## 2015-11-15 ENCOUNTER — Other Ambulatory Visit: Payer: Self-pay | Admitting: *Deleted

## 2015-11-15 ENCOUNTER — Ambulatory Visit (INDEPENDENT_AMBULATORY_CARE_PROVIDER_SITE_OTHER): Payer: 59 | Admitting: Internal Medicine

## 2015-11-15 ENCOUNTER — Encounter: Payer: Self-pay | Admitting: Internal Medicine

## 2015-11-15 ENCOUNTER — Other Ambulatory Visit (INDEPENDENT_AMBULATORY_CARE_PROVIDER_SITE_OTHER): Payer: 59 | Admitting: *Deleted

## 2015-11-15 VITALS — BP 112/64 | HR 80 | Temp 98.2°F | Resp 12 | Wt 163.6 lb

## 2015-11-15 DIAGNOSIS — E1165 Type 2 diabetes mellitus with hyperglycemia: Secondary | ICD-10-CM

## 2015-11-15 DIAGNOSIS — Z794 Long term (current) use of insulin: Secondary | ICD-10-CM | POA: Diagnosis not present

## 2015-11-15 LAB — POCT GLYCOSYLATED HEMOGLOBIN (HGB A1C): Hemoglobin A1C: 9.1

## 2015-11-15 MED ORDER — BASAGLAR KWIKPEN 100 UNIT/ML ~~LOC~~ SOPN: 55.0000 [IU] | PEN_INJECTOR | Freq: Every day | SUBCUTANEOUS | Status: DC

## 2015-11-15 MED ORDER — CANAGLIFLOZIN 100 MG PO TABS: 100.0000 mg | ORAL_TABLET | Freq: Every day | ORAL | Status: DC

## 2015-11-15 NOTE — Patient Instructions (Addendum)
Please continue: - Metformin 1000 mg 2x a day - Glipizide 5 mg 2x a day before b'fast and before supper. If you skin b'fast, take the first dose before lunch. - Tanzeum to 50 mg every week.  Increase: - Lantus to 55 units at bedtime  Add: - Invokana 100 mg in am, before breakfast  Please return in 1.5 months with your sugar log.

## 2015-11-15 NOTE — Progress Notes (Signed)
Patient ID: Nicole Villanueva, female   DOB: 03/06/1966, 50 y.o.   MRN: [id number]  HPI: Nicole Villanueva is a 50 y.o.-year-old female, returning for f/u for DM2, dx in ~2001, insulin-dependent since 2008, uncontrolled, without complications. Last visit 3 mo ago.  She had a frozen shoulder >> steroid shot in 09/2015. She will have surgery of her shoulder.  Last hemoglobin A1c was: Lab Results  Component Value Date   HGBA1C 8.9 06/22/2015   HGBA1C 8.9* 06/22/2015   HGBA1C 7.30 03/08/2015  Before last HbA1c, she was taking half of the Metformin and Glyburide doses.   She is on: - Metformin 1000 mg 2x a day - Lantus 45 units at bedtime - Glipizide 5 mg 2x a day, 15-20 min before b'fast and before supper. If skip b'fast, take the first dose before lunch. - Tanzeum 30 >> 50 mg every week.  Pt checks her sugars 1x a day and they are: - am: 95-155 >> 105-156, 188 >> 234-247, 334 - 2h after b'fast: n/c - before lunch: n/c >> 99 >> 193 - 2h after lunch: 170, 220-290 >> 65 >> 276 - before dinner: 290s >> 166 - 2h after dinner: n/c - bedtime: n/c >> 134-210 - nighttime: n/c No lows. Lowest sugar was 95 >> 65x1; she has hypoglycemia awareness at 70-80s.  Highest sugar was 250 >> 253.  Glucometer: Accu-Chek Aviva  Pt's meals are: - Breakfast: 1 poached egg + banana , no bread; but mostly smoothie with almond milk + strawberries + protein powder; or skips - Lunch: meat + veggies + starch, occasionally dessert, fruit - Dinner: meat + veggies + starch, occasionally dessert - Snacks: no Drinks unsweet tea, water  - no CKD, last BUN/creatinine:  Lab Results  Component Value Date   BUN 9 06/22/2015   CREATININE 0.43* 06/22/2015  She was on Lisinopril before >> hypotension >> stopped. - last set of lipids: Lab Results  Component Value Date   CHOL 117* 06/22/2015   HDL 38* 06/22/2015   LDLCALC 51 06/22/2015   TRIG 138 06/22/2015   CHOLHDL 3.1 06/22/2015  She is on  atorvastatin. - last eye exam was in 03/2015. No DR. + small cataract L eye.  - no numbness and tingling in her feet.  She also has HL.   ROS: Constitutional: + weight gain, no fatigue, no subjective hyperthermia/hypothermia Eyes: no blurry vision, no xerophthalmia ENT: no sore throat, no nodules palpated in throat, no dysphagia/odynophagia, no hoarseness Cardiovascular: no CP/SOB/palpitations/leg swelling Respiratory: no cough/SOB Gastrointestinal: no N/V/D/C Musculoskeletal: no muscle/joint aches Skin: no rashes Neurological: no tremors/numbness/tingling/dizziness  I reviewed pt's medications, allergies, PMH, social hx, family hx, and changes were documented in the history of present illness. Otherwise, unchanged from my initial visit note.  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  Past Medical History  Diagnosis Date  . Hypertension   . Diabetes mellitus   . Hyperlipidemia    Past Surgical History  Procedure Laterality Date  . Cholecystectomy     Social History   Social History  . Marital Status: Single, separated    Spouse Name: N/A  . Number of Children: 2   Occupational History  .    Social History Main Topics  . Smoking status: Never Smoker   . Smokeless tobacco: Never Used  . Alcohol Use: No  . Drug Use: No   Current Outpatient Prescriptions on File Prior to Visit  Medication Sig Dispense Refill  . Albiglutide 50 MG PEN Inject  50 mg once a week under skin. (Patient not taking: Reported on 09/11/2015) 12 each 1  . amoxicillin-clavulanate (AUGMENTIN) 875-125 MG tablet Take 1 tablet by mouth 2 (two) times daily. 20 tablet 0  . atorvastatin (LIPITOR) 40 MG tablet Take 1 tablet (40 mg total) by mouth daily. 90 tablet 3  . cyclobenzaprine (FLEXERIL) 10 MG tablet Take 1 tablet (10 mg total) by mouth 3 (three) times daily as needed for muscle spasms. (Patient not taking: Reported on 09/11/2015) 30 tablet 0  . glipiZIDE (GLUCOTROL) 5 MG tablet Take 1 tablet (5 mg total)  by mouth 2 (two) times daily before a meal. 60 tablet 3  . GlucoCom Lancets MISC Check blood sugar TID & QHS 100 each 3  . glucose blood (ACCU-CHEK COMPACT PLUS) test strip 1 each by Other route as needed for other. Test blood sugar 3 times daily. Dx: E11.65 300 each 3  . HYDROcodone-homatropine (HYCODAN) 5-1.5 MG/5ML syrup Take 5 mLs by mouth every 8 (eight) hours as needed for cough. 120 mL 0  . Insulin Glargine (LANTUS SOLOSTAR) 100 UNIT/ML Solostar Pen Inject 45 Units into the skin daily at 10 pm. 15 mL 5  . Lancet Device MISC Check blood sugar 3x per day 90 each 5  . metFORMIN (GLUCOPHAGE) 1000 MG tablet Take 1 tablet (1,000 mg total) by mouth 2 (two) times daily with a meal. 60 tablet 4  . naproxen sodium (ANAPROX DS) 550 MG tablet Take 1 tablet (550 mg total) by mouth 2 (two) times daily with a meal. 40 tablet 0  . pantoprazole (PROTONIX) 40 MG tablet Take 1 tablet (40 mg total) by mouth daily. 30 tablet 3   No current facility-administered medications on file prior to visit.   No Known Allergies Family History  Problem Relation Age of Onset  . Diabetes Mother   . Diabetes Father    PE: BP 112/64 mmHg  Pulse 80  Temp(Src) 98.2 F (36.8 C) (Oral)  Resp 12  Wt 163 lb 9.6 oz (74.208 kg)  SpO2 98%  LMP 09/29/2011 Body mass index is 31.95 kg/(m^2). Wt Readings from Last 3 Encounters:  11/15/15 163 lb 9.6 oz (74.208 kg)  09/11/15 159 lb 9.6 oz (72.394 kg)  08/21/15 158 lb (71.668 kg)   Constitutional: overweight, in NAD Eyes: PERRLA, EOMI, no exophthalmos ENT: moist mucous membranes, no thyromegaly, no cervical lymphadenopathy Cardiovascular: RRR, No MRG Respiratory: CTA B Gastrointestinal: abdomen soft, NT, ND, BS+ Musculoskeletal: no deformities, strength intact in all 4 Skin: moist, warm, no rashes Neurological: no tremor with outstretched hands, DTR normal in all 4  ASSESSMENT: 1. DM2, insulin-dependent, uncontrolled, without complications  PLAN:  1. Patient with  long-standing, uncontrolled diabetes, on oral antidiabetic regimen + basal insulin, to which we added a GLP1 R agonist (Tanzeum). We also switched from Glyburide to Glipizide b/c the first reduces cardiac ischemic preconditioning - sugars much worse at this visit, after her steroid inj last month >> will increase insulin and add Invokana - we discussed about SEs of Invokana, which are: dizziness (advised to be careful when stands from sitting position), decreased BP - usually not < normal (BP today is not low), and fungal UTIs (advised to let me know if develops one).  - given discount card for Invokana - I suggested to:  Patient Instructions  Please continue: - Metformin 1000 mg 2x a day - Glipizide 5 mg 2x a day before b'fast and before supper. If you skin b'fast, take the first dose before  lunch. - Tanzeum to 50 mg every week.  Increase: - Lantus to 55 units at bedtime  Add: - Invokana 100 mg in am, before breakfast  Please return in 1.5 months with your sugar log.   - Strongly advised her to start checking sugars at different times of the day - check 2 times a day, rotating checks - advised for yearly eye exams >> she is UTD - she had the flu shot this season - reviewed last HbA1c >> 9.1% (increased). - Return to clinic in 1.5 mo with sugar log

## 2016-01-01 ENCOUNTER — Encounter: Payer: Self-pay | Admitting: Internal Medicine

## 2016-01-01 ENCOUNTER — Ambulatory Visit (INDEPENDENT_AMBULATORY_CARE_PROVIDER_SITE_OTHER): Payer: 59 | Admitting: Internal Medicine

## 2016-01-01 VITALS — BP 104/60 | HR 94 | Temp 98.7°F | Resp 12 | Wt 161.0 lb

## 2016-01-01 DIAGNOSIS — E1165 Type 2 diabetes mellitus with hyperglycemia: Secondary | ICD-10-CM | POA: Diagnosis not present

## 2016-01-01 DIAGNOSIS — Z794 Long term (current) use of insulin: Secondary | ICD-10-CM

## 2016-01-01 NOTE — Progress Notes (Signed)
Patient ID: [name], female   DOB: 1965/11/12, 50 y.o.   MRN: [id number]  HPI: Nicole Villanueva is a 50 y.o.-year-old female, returning for f/u for DM2, dx in ~2001, insulin-dependent since 2008, uncontrolled, without complications. Last visit 1.5 mo ago.  Last hemoglobin A1c was: Lab Results  Component Value Date   HGBA1C 9.1 11/15/2015   HGBA1C 8.9 06/22/2015   HGBA1C 8.9* 06/22/2015  Before last HbA1c, she was taking half of the Metformin and Glyburide doses.   She is on: - Metformin 1000 mg 2x a day - Glipizide 5 mg 2x a day before b'fast and before supper. If you skin b'fast, take the first dose before lunch. - Tanzeum 50 mg every week - Invokana 100 mg in am, before breakfast - Lantus 55 units at bedtime  Pt checks her sugars 1x a day and they are MUCH better esp. in last week: - am: 95-155 >> 105-156, 188 >> 234-247, 334 >> 136-284 (improving each week: 136-153 in last week) - 2h after b'fast: n/c - before lunch: n/c >> 99 >> 193 >> 89 - 2h after lunch: 170, 220-290 >> 65 >> 276 >> 81 - before dinner: 290s >> 166 >> n/c - 2h after dinner: n/c - bedtime: n/c >> 134-210 >> 156 - nighttime: n/c No lows. Lowest sugar was 95 >> 65x1 >> 81; she has hypoglycemia awareness at 70-80s.  Highest sugar was 250 >> 253 >> 296 (forgot insulin)  Glucometer: Accu-Chek Aviva  Pt's meals are: - Breakfast: 1 poached egg + banana , no bread; but mostly smoothie with almond milk + strawberries + protein powder; or skips - Lunch: meat + veggies + starch, occasionally dessert, fruit - Dinner: meat + veggies + starch, occasionally dessert - Snacks: no Drinks unsweet tea, water  - no CKD, last BUN/creatinine:  Lab Results  Component Value Date   BUN 9 06/22/2015   CREATININE 0.43* 06/22/2015  She was on Lisinopril before >> hypotension >> stopped. - last set of lipids: Lab Results  Component Value Date   CHOL 117* 06/22/2015   HDL 38* 06/22/2015   LDLCALC 51  06/22/2015   TRIG 138 06/22/2015   CHOLHDL 3.1 06/22/2015  She is on atorvastatin. - last eye exam was in 03/2015. No DR. + small cataract L eye.  - no numbness and tingling in her feet.  She also has HL.   ROS: Constitutional: no weight gain/loss, no fatigue, no subjective hyperthermia/hypothermia Eyes: no blurry vision, no xerophthalmia ENT: no sore throat, no nodules palpated in throat, no dysphagia/odynophagia, no hoarseness Cardiovascular: no CP/SOB/palpitations/leg swelling Respiratory: no cough/SOB Gastrointestinal: no N/V/D/C Musculoskeletal: no muscle/joint aches Skin: no rashes Neurological: no tremors/numbness/tingling/dizziness  I reviewed pt's medications, allergies, PMH, social hx, family hx, and changes were documented in the history of present illness. Otherwise, unchanged from my initial visit note.  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  Past Medical History  Diagnosis Date  . Hypertension   . Diabetes mellitus   . Hyperlipidemia    Past Surgical History  Procedure Laterality Date  . Cholecystectomy     Social History   Social History  . Marital Status: Single, separated    Spouse Name: N/A  . Number of Children: 2   Occupational History  .    Social History Main Topics  . Smoking status: Never Smoker   . Smokeless tobacco: Never Used  . Alcohol Use: No  . Drug Use: No   Current Outpatient Prescriptions on  File Prior to Visit  Medication Sig Dispense Refill  . Albiglutide 50 MG PEN Inject 50 mg once a week under skin. 12 each 1  . atorvastatin (LIPITOR) 40 MG tablet Take 1 tablet (40 mg total) by mouth daily. 90 tablet 3  . canagliflozin (INVOKANA) 100 MG TABS tablet Take 1 tablet (100 mg total) by mouth daily before breakfast. 30 tablet 5  . glipiZIDE (GLUCOTROL) 5 MG tablet Take 1 tablet (5 mg total) by mouth 2 (two) times daily before a meal. 60 tablet 3  . GlucoCom Lancets MISC Check blood sugar TID & QHS 100 each 3  . glucose blood  (ACCU-CHEK COMPACT PLUS) test strip 1 each by Other route as needed for other. Test blood sugar 3 times daily. Dx: E11.65 300 each 3  . Insulin Glargine (BASAGLAR KWIKPEN) 100 UNIT/ML SOPN Inject 0.55 mLs (55 Units total) into the skin at bedtime. 5 pen 5  . Lancet Device MISC Check blood sugar 3x per day 90 each 5  . metFORMIN (GLUCOPHAGE) 1000 MG tablet Take 1 tablet (1,000 mg total) by mouth 2 (two) times daily with a meal. 60 tablet 4  . pantoprazole (PROTONIX) 40 MG tablet Take 1 tablet (40 mg total) by mouth daily. 30 tablet 3   No current facility-administered medications on file prior to visit.   No Known Allergies Family History  Problem Relation Age of Onset  . Diabetes Mother   . Diabetes Father    PE: BP 104/60 mmHg  Pulse 94  Temp(Src) 98.7 F (37.1 C) (Oral)  Resp 12  Wt 161 lb (73.029 kg)  SpO2 97%  LMP 09/29/2011 Body mass index is 31.44 kg/(m^2). Wt Readings from Last 3 Encounters:  01/01/16 161 lb (73.029 kg)  11/15/15 163 lb 9.6 oz (74.208 kg)  09/11/15 159 lb 9.6 oz (72.394 kg)   Constitutional: overweight, in NAD Eyes: PERRLA, EOMI, no exophthalmos ENT: moist mucous membranes, no thyromegaly, no cervical lymphadenopathy Cardiovascular: RRR, No MRG Respiratory: CTA B Gastrointestinal: abdomen soft, NT, ND, BS+ Musculoskeletal: no deformities, strength intact in all 4 Skin: moist, warm, no rashes Neurological: no tremor with outstretched hands, DTR normal in all 4  ASSESSMENT: 1. DM2, insulin-dependent, uncontrolled, without complications  PLAN:  1. Patient with long-standing, uncontrolled diabetes, on oral antidiabetic regimen + basal insulin, to which we added a GLP1 R agonist (Tanzeum) and, as sugars were much worse at last visit, after her steroid inj >> we increased insulin and added Invokana - reviewed last HbA1c >> 9.1% (increased). - I suggested to:  Patient Instructions  Please continue: - Metformin 1000 mg 2x a day - Glipizide 5 mg 2x a day  before b'fast and before supper. If you skin b'fast, take the first dose before lunch. - Tanzeum to 50 mg every week - Invokana 100 mg in am, before breakfast - Lantus to 55 units at bedtime  Please return in 1.5 months with your sugar log.   - Strongly advised her to start checking sugars at different times of the day - check 2 times a day, rotating checks - advised for yearly eye exams >> she is UTD - will check BMP today - will check HbA1c at next visit - Return to clinic in 1.5 mo with sugar log

## 2016-01-01 NOTE — Patient Instructions (Signed)
Please continue: - Metformin 1000 mg 2x a day - Glipizide 5 mg 2x a day before b'fast and before supper - Tanzeum 50 mg every week - Invokana 100 mg in am, before breakfast - Lantus 55 units at bedtime  Please return in 1.5 months with your sugar log.   Please stop at [facility] office for labs.

## 2016-01-22 ENCOUNTER — Other Ambulatory Visit: Payer: Self-pay | Admitting: Internal Medicine

## 2016-02-21 ENCOUNTER — Ambulatory Visit: Payer: 59 | Admitting: Internal Medicine

## 2016-03-20 ENCOUNTER — Other Ambulatory Visit: Payer: Self-pay | Admitting: Pharmacist

## 2016-03-20 MED ORDER — ACCU-CHEK SOFTCLIX LANCETS MISC
0 refills | Status: DC
Start: 2016-03-20 — End: 2016-04-22

## 2016-04-22 ENCOUNTER — Encounter: Payer: Self-pay | Admitting: Internal Medicine

## 2016-04-22 ENCOUNTER — Ambulatory Visit (INDEPENDENT_AMBULATORY_CARE_PROVIDER_SITE_OTHER): Payer: 59 | Admitting: Internal Medicine

## 2016-04-22 VITALS — BP 112/82 | HR 90 | Ht 60.0 in | Wt 162.0 lb

## 2016-04-22 DIAGNOSIS — E139 Other specified diabetes mellitus without complications: Secondary | ICD-10-CM

## 2016-04-22 DIAGNOSIS — E1165 Type 2 diabetes mellitus with hyperglycemia: Secondary | ICD-10-CM

## 2016-04-22 DIAGNOSIS — Z794 Long term (current) use of insulin: Secondary | ICD-10-CM

## 2016-04-22 LAB — POCT GLYCOSYLATED HEMOGLOBIN (HGB A1C): Hemoglobin A1C: 8.7

## 2016-04-22 MED ORDER — ACCU-CHEK SOFTCLIX LANCETS MISC: 3 refills | Status: DC

## 2016-04-22 MED ORDER — CANAGLIFLOZIN 300 MG PO TABS: 300.0000 mg | ORAL_TABLET | Freq: Every day | ORAL | 5 refills | Status: DC

## 2016-04-22 MED ORDER — BASAGLAR KWIKPEN 100 UNIT/ML ~~LOC~~ SOPN: 55.0000 [IU] | PEN_INJECTOR | Freq: Every day | SUBCUTANEOUS | 11 refills | Status: DC

## 2016-04-22 MED ORDER — GLUCOSE BLOOD VI STRP
1.0000 | ORAL_STRIP | 3 refills | Status: DC | PRN
Start: 2016-04-22 — End: 2018-09-27

## 2016-04-22 MED ORDER — ALBIGLUTIDE 50 MG ~~LOC~~ PEN: PEN_INJECTOR | SUBCUTANEOUS | 3 refills | Status: DC

## 2016-04-22 MED ORDER — METFORMIN HCL 1000 MG PO TABS: 1000.0000 mg | ORAL_TABLET | Freq: Two times a day (BID) | ORAL | 11 refills | Status: DC

## 2016-04-22 MED ORDER — GLIPIZIDE 5 MG PO TABS: ORAL_TABLET | ORAL | 11 refills | Status: DC

## 2016-04-22 NOTE — Progress Notes (Signed)
Patient ID: Nicole Villanueva, female   DOB: [birth year], 50 y.o.   MRN: [id number]  HPI: Nicole Villanueva is a 50 y.o.-year-old female, returning for f/u for DM2, dx in ~2001, insulin-dependent since 2008, uncontrolled, without complications. Last visit 3.5 mo ago.  Sugars are higher in last 1.5 mo as she eats later.   Last hemoglobin A1c was: 02/08/2016: HbA1c 7.8% Lab Results  Component Value Date   HGBA1C 9.1 11/15/2015   HGBA1C 8.9 06/22/2015   HGBA1C 8.9 (A) 06/22/2015  Before last HbA1c, she was taking half of the Metformin and Glyburide doses.   She is on: - Metformin 1000 mg 2x a day - Glipizide 5 mg 2x a day before b'fast and before supper. If you skin b'fast, take the first dose before lunch. - Tanzeum 50 mg every week - Invokana 100 mg in am, before breakfast  - Basaglar 55 units at bedtime  Pt checks her sugars 1x a day and they are worse - am: 95-155 >> 105-156, 188 >> 234-247, 334 >> 136-284 (improving each week: 136-153 in last week) >> 144-228, 274 - 2h after b'fast: n/c - before lunch: n/c >> 99 >> 193 >> 89 >> n/c - 2h after lunch: 170, 220-290 >> 65 >> 276 >> 81 >> n/c - before dinner: 290s >> 166 >> n/c - 2h after dinner: n/c - bedtime: n/c >> 134-210 >> 156 >> n/c - nighttime: n/c No lows. Lowest sugar was 95 >> 65x1 >> 81 >> 144; she has hypoglycemia awareness at 70-80s.  Highest sugar was 250 >> 253 >> 296 (forgot insulin) >> 274.  Glucometer: Accu-Chek Aviva  Pt's meals are: - Breakfast: 1 poached egg + banana , no bread; but mostly smoothie with almond milk + strawberries + protein powder; or skips - Lunch: meat + veggies + starch, occasionally dessert, fruit - Dinner: meat + veggies + starch, occasionally dessert - Snacks: no Drinks unsweet tea, water  - no CKD, last BUN/creatinine:  Lab Results  Component Value Date   BUN 9 06/22/2015   CREATININE 0.43 (L) 06/22/2015  She was on Lisinopril before >> hypotension >> stopped. -  last set of lipids: Lab Results  Component Value Date   CHOL 117 (L) 06/22/2015   HDL 38 (L) 06/22/2015   LDLCALC 51 06/22/2015   TRIG 138 06/22/2015   CHOLHDL 3.1 06/22/2015  She is on atorvastatin. - last eye exam was in 03/2015. No DR. + small cataract L eye.  - no numbness and tingling in her feet.  She also has HL.   ROS: Constitutional: no weight gain/loss, no fatigue, no subjective hyperthermia/hypothermia Eyes: no blurry vision, no xerophthalmia ENT: no sore throat, no nodules palpated in throat, no dysphagia/odynophagia, no hoarseness Cardiovascular: no CP/SOB/palpitations/leg swelling Respiratory: no cough/SOB Gastrointestinal: no N/V/D/C Musculoskeletal: no muscle/joint aches Skin: no rashes Neurological: no tremors/numbness/tingling/dizziness  I reviewed pt's medications, allergies, PMH, social hx, family hx, and changes were documented in the history of present illness. Otherwise, unchanged from my initial visit note.  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  Past Medical History:  Diagnosis Date  . Diabetes mellitus   . Hyperlipidemia   . Hypertension    Past Surgical History:  Procedure Laterality Date  . CHOLECYSTECTOMY     Social History   Social History  . Marital Status: Single, separated    Spouse Name: N/A  . Number of Children: 2   Occupational History  .    Social History Main Topics  .  Smoking status: Never Smoker   . Smokeless tobacco: Never Used  . Alcohol Use: No  . Drug Use: No   Current Outpatient Prescriptions on File Prior to Visit  Medication Sig Dispense Refill  . ACCU-CHEK SOFTCLIX LANCETS lancets Use as instructed 100 each 0  . Albiglutide 50 MG PEN Inject 50 mg once a week under skin. 12 each 1  . atorvastatin (LIPITOR) 40 MG tablet Take 1 tablet (40 mg total) by mouth daily. 90 tablet 3  . canagliflozin (INVOKANA) 100 MG TABS tablet Take 1 tablet (100 mg total) by mouth daily before breakfast. 30 tablet 5  . glipiZIDE  (GLUCOTROL) 5 MG tablet TAKE ONE TABLET BY MOUTH TWICE DAILY BEFORE MEAL(S) 60 tablet 2  . glucose blood (ACCU-CHEK COMPACT PLUS) test strip 1 each by Other route as needed for other. Test blood sugar 3 times daily. Dx: E11.65 300 each 3  . Insulin Glargine (BASAGLAR KWIKPEN) 100 UNIT/ML SOPN Inject 0.55 mLs (55 Units total) into the skin at bedtime. 5 pen 5  . metFORMIN (GLUCOPHAGE) 1000 MG tablet Take 1 tablet (1,000 mg total) by mouth 2 (two) times daily with a meal. 60 tablet 4  . pantoprazole (PROTONIX) 40 MG tablet Take 1 tablet (40 mg total) by mouth daily. 30 tablet 3   No current facility-administered medications on file prior to visit.    No Known Allergies Family History  Problem Relation Age of Onset  . Diabetes Mother   . Diabetes Father    PE: BP 112/82 (BP Location: Left Arm, Patient Position: Sitting)   Pulse 90   Ht 5' (1.524 m)   Wt 162 lb (73.5 kg)   LMP 09/29/2011   SpO2 95%   BMI 31.64 kg/m  Body mass index is 31.64 kg/m. Wt Readings from Last 3 Encounters:  04/22/16 162 lb (73.5 kg)  01/01/16 161 lb (73 kg)  11/15/15 163 lb 9.6 oz (74.2 kg)   Constitutional: overweight, in NAD Eyes: PERRLA, EOMI, no exophthalmos ENT: moist mucous membranes, no thyromegaly, no cervical lymphadenopathy Cardiovascular: RRR, No MRG Respiratory: CTA B Gastrointestinal: abdomen soft, NT, ND, BS+ Musculoskeletal: no deformities, strength intact in all 4 Skin: moist, warm, no rashes Neurological: no tremor with outstretched hands, DTR normal in all 4  ASSESSMENT: 1. DM2, insulin-dependent, uncontrolled, without complications  PLAN:  1. Patient with long-standing, uncontrolled diabetes, on oral antidiabetic regimen + basal insulin + GLP1 R agonist (Tanzeum) + SGLT2 R blocker (Invokana). Sugars improved initially but she was out of work b/c surgery (hysterectomy) >> sugars higher. She is now back to work.  - strongly advised her to start checking sugars at different times a day  (now checking only in am) and eating dinner earlier. We do not have many other options now other than adding mealtime insulin, which we may need at next visit. Will increase Invokana for now. - I suggested to:  Patient Instructions  Please continue: - Metformin 1000 mg 2x a day - Glipizide 5 mg 2x a day before b'fast and before supper. If you skin b'fast, take the first dose before lunch. - Tanzeum to 50 mg every week - Lantus 55 units at bedtime  Please increase Invokana to 300 mg daily in am.  Please return in 1.5 months with your sugar log.   - Strongly advised her to start checking sugars at different times of the day - check 2 times a day, rotating checks - advised for yearly eye exams >> she is UTD -  refilled all her DM meds - checked HbA1c today >> 8.7% (higher) - Return to clinic in 1.5 mo with sugar log  Carlus Pavlov, MD PhD [facility] Endocrinology

## 2016-04-22 NOTE — Patient Instructions (Addendum)
Patient Instructions  Please continue: - Metformin 1000 mg 2x a day - Glipizide 5 mg 2x a day before b'fast and before supper. If you skin b'fast, take the first dose before lunch. - Tanzeum to 50 mg every week - Lantus 55 units at bedtime  Please increase Invokana to 300 mg daily in am.  Please return in 1.5 months with your sugar log.

## 2016-04-22 NOTE — Addendum Note (Signed)
Addended by: Darene LamerHOMPSON, Milanna Kozlov T on: 04/22/2016 02:51 PM   Modules accepted: Orders

## 2016-06-13 ENCOUNTER — Encounter: Payer: Self-pay | Admitting: Physician Assistant

## 2016-06-13 DIAGNOSIS — E11319 Type 2 diabetes mellitus with unspecified diabetic retinopathy without macular edema: Secondary | ICD-10-CM | POA: Insufficient documentation

## 2016-06-18 ENCOUNTER — Ambulatory Visit: Payer: 59 | Admitting: Internal Medicine

## 2016-07-09 ENCOUNTER — Ambulatory Visit (INDEPENDENT_AMBULATORY_CARE_PROVIDER_SITE_OTHER): Payer: 59 | Admitting: Internal Medicine

## 2016-07-09 ENCOUNTER — Encounter: Payer: Self-pay | Admitting: Internal Medicine

## 2016-07-09 VITALS — BP 129/76 | HR 81 | Ht <= 58 in | Wt 160.0 lb

## 2016-07-09 DIAGNOSIS — E1165 Type 2 diabetes mellitus with hyperglycemia: Secondary | ICD-10-CM | POA: Diagnosis not present

## 2016-07-09 DIAGNOSIS — Z794 Long term (current) use of insulin: Secondary | ICD-10-CM | POA: Diagnosis not present

## 2016-07-09 LAB — BASIC METABOLIC PANEL WITH GFR
BUN: 17 mg/dL (ref 7–25)
CO2: 24 mmol/L (ref 20–31)
Calcium: 9.6 mg/dL (ref 8.6–10.2)
Chloride: 102 mmol/L (ref 98–110)
Creat: 0.47 mg/dL — ABNORMAL LOW (ref 0.50–1.10)
GFR, Est African American: >89 mL/min (ref >=60)
GFR, Est Non African American: >89 mL/min (ref >=60)
Glucose, Bld: 131 mg/dL — ABNORMAL HIGH (ref 65–99)
Potassium: 4.5 mmol/L (ref 3.5–5.3)
Sodium: 137 mmol/L (ref 135–146)

## 2016-07-09 LAB — LIPID PANEL
Cholesterol: 216 mg/dL — ABNORMAL HIGH (ref 0–200)
HDL: 38.9 mg/dL — ABNORMAL LOW (ref >39.00)
NonHDL: 176.62
Total CHOL/HDL Ratio: 6
Triglycerides: 295 mg/dL — ABNORMAL HIGH (ref 0.0–149.0)
VLDL: 59 mg/dL — ABNORMAL HIGH (ref 0.0–40.0)

## 2016-07-09 LAB — POCT GLYCOSYLATED HEMOGLOBIN (HGB A1C): Hemoglobin A1C: 8.1

## 2016-07-09 LAB — LDL CHOLESTEROL, DIRECT: Direct LDL: 147 mg/dL

## 2016-07-09 NOTE — Patient Instructions (Addendum)
Please continue: - Metformin 1000 mg 2x a day - Glipizide 5 mg 2x a day before b'fast and before supper. If you skin b'fast, take the first dose before lunch. You can increase the dinnertime Glipizide dose to 10 mg for a larger meal.  Take Glipizide always 15-30 min before a meal. - Invokana 300 mg daily in am - Tanzeum 50 mg every week - Basaglar 55 units at bedtime  If you have a late dinner >> make this a very light meal.  Please return in 3 months with your sugar log.

## 2016-07-09 NOTE — Progress Notes (Signed)
Patient ID: Nicole Villanueva, female   DOB: [birth year], 50 y.o.   MRN: [id number]  HPI: Nicole Villanueva is a 50 y.o.-year-old female, returning for f/u for DM2, dx in ~2001, insulin-dependent since 2008, uncontrolled, without complications. Last visit 2.5 mo ago.  Last hemoglobin A1c was: Lab Results  Component Value Date   HGBA1C 8.7 04/22/2016   HGBA1C 9.1 11/15/2015   HGBA1C 8.9 06/22/2015  02/08/2016: HbA1c 7.8% Before last HbA1c, she was taking half of the Metformin and Glyburide doses.   She is on: - Metformin 1000 mg 2x a day - Glipizide 5 mg 2x a day before b'fast and before supper. If you skin b'fast, take the first dose before lunch. 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 Lowest sugar was 95 >> 65x1 >> 81 >> 144 >> 69; she has hypoglycemia awareness at 70-80s.  Highest sugar was 250 >> 253 >> 296 (forgot insulin) >> 274 >> 210.  Glucometer: Accu-Chek Aviva  Pt's meals are: - Breakfast: 1 poached egg + banana , no bread; but mostly smoothie with almond milk + strawberries + protein powder; or skips - Lunch: meat + veggies + starch, occasionally dessert, fruit - Dinner: meat + veggies + starch, occasionally dessert - Snacks: no Drinks unsweet tea, water  - no CKD, last BUN/creatinine:  02/08/2016: 10/0.41 Lab Results  Component Value Date   BUN 9 06/22/2015   CREATININE 0.43 (L) 06/22/2015   She was on Lisinopril before >> hypotension >> stopped. - last set of lipids: Lab Results  Component Value Date   CHOL 117 (L) 06/22/2015   HDL 38 (L) 06/22/2015   LDLCALC 51 06/22/2015   TRIG 138 06/22/2015   CHOLHDL 3.1 06/22/2015  She is on atorvastatin 40.. - last eye exam was in 05/2016. No DR. + small cataract L eye.  - no numbness and tingling in her feet.  She also has HL.   ROS: Constitutional: no weight gain/loss, no fatigue, no subjective hyperthermia/hypothermia Eyes: no blurry vision, no xerophthalmia ENT: no sore throat, no nodules palpated in throat, no dysphagia/odynophagia, no hoarseness Cardiovascular: no CP/SOB/palpitations/leg swelling Respiratory: no cough/SOB Gastrointestinal: no N/V/D/C Musculoskeletal: no muscle/joint aches Skin: no rashes Neurological: no tremors/+ numbness - ankle/no tingling/dizziness  I reviewed pt's medications, allergies, PMH, social hx, family hx, and changes were documented in the history of present illness. Otherwise, unchanged from my initial visit note.  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  Past Medical History:  Diagnosis Date  . Diabetes mellitus   . Hyperlipidemia   . Hypertension    Past Surgical History:  Procedure Laterality Date  . CHOLECYSTECTOMY     Social History   Social History  . Marital Status: Single, separated    Spouse Name: N/A  . Number of  Children: 2   Occupational History  .    Social History Main Topics  . Smoking status: Never Smoker   . Smokeless tobacco: Never Used  . Alcohol Use: No  . Drug Use: No   Current Outpatient Prescriptions on File Prior to Visit  Medication Sig Dispense Refill  . ACCU-CHEK SOFTCLIX LANCETS lancets Use as instructed 3x a day 300 each 3  . Albiglutide 50 MG PEN Inject 50 mg once a week under skin. 12 each 3  . atorvastatin (LIPITOR) 40 MG tablet Take 1 tablet (40 mg total) by mouth daily. 90 tablet 3  . canagliflozin (INVOKANA) 300 MG TABS tablet Take  1 tablet (300 mg total) by mouth daily before breakfast. 30 tablet 5  . dexlansoprazole (DEXILANT) 60 MG capsule Take 60 mg by mouth daily.    Marland Kitchen. glipiZIDE (GLUCOTROL) 5 MG tablet TAKE ONE TABLET BY MOUTH TWICE DAILY BEFORE MEAL(S) 60 tablet 11  . glucose blood (ACCU-CHEK COMPACT PLUS) test strip 1 each by Other route as needed for other. Test blood sugar 3 times daily. Dx: E11.65 300 each 3  . Insulin Glargine (BASAGLAR KWIKPEN) 100 UNIT/ML SOPN Inject 0.55 mLs (55 Units total) into the skin at bedtime. 5 pen 11  . metFORMIN (GLUCOPHAGE) 1000 MG tablet Take 1 tablet (1,000 mg total) by mouth 2 (two) times daily with a meal. 60 tablet 11  . Plecanatide (TRULANCE) 3 MG TABS Take by mouth.     No current facility-administered medications on file prior to visit.    No Known Allergies Family History  Problem Relation Age of Onset  . Diabetes Mother   . Diabetes Father    PE: BP 129/76 (BP Location: Left Arm, Patient Position: Sitting, Cuff Size: Normal)   Pulse 81   Ht 4\' 7"  (1.397 m)   Wt 160 lb (72.6 kg)   LMP 09/29/2011   SpO2 96%   BMI 37.19 kg/m  Body mass index is 37.19 kg/m. Wt Readings from Last 3 Encounters:  07/09/16 160 lb (72.6 kg)  04/22/16 162 lb (73.5 kg)  01/01/16 161 lb (73 kg)   Constitutional: overweight, in NAD Eyes: PERRLA, EOMI, no exophthalmos ENT: moist mucous membranes, no thyromegaly, no cervical lymphadenopathy Cardiovascular: RRR, No MRG Respiratory: CTA B Gastrointestinal: abdomen soft, NT, ND, BS+ Musculoskeletal: no deformities, strength intact in all 4 Skin: moist, warm, no rashes Neurological: no tremor with outstretched hands, DTR normal in all 4  ASSESSMENT: 1. DM2, insulin-dependent, uncontrolled, without complications  PLAN:  1. Patient with long-standing, uncontrolled diabetes, on oral antidiabetic regimen + basal insulin + GLP1 R agonist (Tanzeum) + SGLT2 R blocker (Invokana). Sugars improved but still high in am if she has a late, large  dinner >> advised to get a light dinner if she has to eat late - advised her to take a 10 mg Glipizide before a large dinner - I suggested to:  Patient Instructions  Please continue: - Metformin 1000 mg 2x a day - Glipizide 5 mg 2x a day before b'fast and before supper. If you skin b'fast, take the first dose before lunch. You can increase the dinnertime Glipizide dose to 10 mg for a larger meal.  Take Glipizide always 15-30 min before a meal. - Invokana 300 mg daily in am - Tanzeum 50 mg every week - Basaglar 55 units at bedtime  If you have a late dinner >> make this a very light meal.  Please return in 3 months with your sugar log.   -  continue checking sugars at different times of the day - check 2 times a day, rotating checks - advised for yearly eye exams >> she is UTD - will check a Lipid panel - refuses flu shot - checked HbA1c today >> 8.1% (better) - Return to clinic in 3 mo with sugar log  Office Visit on 07/09/2016  Component Date Value Ref Range Status  . Hemoglobin A1C 07/09/2016 8.1   Final  . Cholesterol 07/09/2016 216* 0 - 200 mg/dL Final  . Triglycerides 07/09/2016 295.0* 0.0 - 149.0 mg/dL Final  . HDL 40/98/119111/15/2017 38.90* >39.00 mg/dL Final  . VLDL 47/82/956211/15/2017 59.0* 0.0 - 40.0 mg/dL Final  . Total CHOL/HDL Ratio 07/09/2016 6   Final  . NonHDL 07/09/2016 176.62   Final  . Sodium 07/09/2016 137  135 - 146 mmol/L Final  . Potassium 07/09/2016 4.5  3.5 - 5.3 mmol/L Final  . Chloride 07/09/2016 102  98 - 110 mmol/L Final  . CO2 07/09/2016 24  20 - 31 mmol/L Final  . Glucose, Bld 07/09/2016 131* 65 - 99 mg/dL Final  . BUN 13/08/657811/15/2017 17  7 - 25 mg/dL Final  . Creat 46/96/295211/15/2017 0.47* 0.50 - 1.10 mg/dL Final  . Calcium 84/13/244011/15/2017 9.6  8.6 - 10.2 mg/dL Final  . GFR, Est African American 07/09/2016 >89  >=60 mL/min Final  . GFR, Est Non African American 07/09/2016 >89  >=60 mL/min Final  . Direct LDL 07/09/2016 147.0  mg/dL Final   Cholesterol levels are high. I  suspect she is not taking her atorvastatin. I will check with her. Kidney tests are normal.  Carlus Pavlovristina Doniel Maiello, MD PhD [facility]

## 2016-10-09 ENCOUNTER — Encounter: Payer: Self-pay | Admitting: Internal Medicine

## 2016-10-09 ENCOUNTER — Ambulatory Visit (INDEPENDENT_AMBULATORY_CARE_PROVIDER_SITE_OTHER): Payer: 59 | Admitting: Internal Medicine

## 2016-10-09 VITALS — BP 110/80 | HR 87 | Ht 60.0 in | Wt 160.0 lb

## 2016-10-09 DIAGNOSIS — Z794 Long term (current) use of insulin: Secondary | ICD-10-CM | POA: Diagnosis not present

## 2016-10-09 DIAGNOSIS — E1165 Type 2 diabetes mellitus with hyperglycemia: Secondary | ICD-10-CM

## 2016-10-09 LAB — POCT GLYCOSYLATED HEMOGLOBIN (HGB A1C): Hemoglobin A1C: 7.9

## 2016-10-09 MED ORDER — PITAVASTATIN CALCIUM 2 MG PO TABS: ORAL_TABLET | ORAL | 11 refills | Status: DC

## 2016-10-09 MED ORDER — GLIPIZIDE 5 MG PO TABS: ORAL_TABLET | ORAL | 3 refills | Status: DC

## 2016-10-09 NOTE — Addendum Note (Signed)
Addended by: Darene LamerHOMPSON, Deysi Soldo T on: 10/09/2016 10:11 AM   Modules accepted: Orders

## 2016-10-09 NOTE — Patient Instructions (Addendum)
Please continue: - Metformin 1000 mg 2x a day - Glipizide 5 mg 2x a day before b'fast and before supper.  Please increase the dinnertime Glipizide dose to 10 mg. - Invokana 300 mg daily in am - Tanzeum 50 mg every week - Basaglar 55 units at bedtime  Please start Livalo 2 mg at night.  Please return in 3 months with your sugar log.

## 2016-10-09 NOTE — Progress Notes (Signed)
Patient ID: Nicole Villanueva, female   DOB: 08-08-1966, 51 y.o.   MRN: [id number]  HPI: Nicole Villanueva is a 51 y.o.-year-old female, returning for f/u for DM2, dx in ~2001, insulin-dependent since 2008, uncontrolled, without complications. Last visit 3 mo ago.  Last hemoglobin A1c was: Lab Results  Component Value Date   HGBA1C 8.1 07/09/2016   HGBA1C 8.7 04/22/2016   HGBA1C 9.1 11/15/2015  02/08/2016: HbA1c 7.8% Before last HbA1c, she was taking half of the Metformin and Glyburide doses.   She is on: - Metformin 1000 mg 2x a day - Glipizide 5 mg 2x a day before b'fast and before supper. She did not increase the dose to 10 mg before a larger dinner. 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 Lowest sugar was 95 >> 65x1 >> 81 >> 144 >> 69 >> 105; she has hypoglycemia awareness at 70-80s.  Highest sugar was 250 >> 253 >> 296 (forgot insulin) >> 274 >> 210 >> 189.  Glucometer: Accu-Chek Aviva  Pt's meals are: - Breakfast: 1 poached egg + banana , no bread; but mostly smoothie with almond milk + strawberries + protein powder; or skips - Lunch: meat + veggies + starch, occasionally dessert, fruit - Dinner: meat + veggies + starch, occasionally dessert - Snacks: no Drinks unsweet tea, water  - no CKD, last BUN/creatinine:  02/08/2016: 10/0.41 Lab Results  Component Value Date   BUN 17 07/09/2016   CREATININE 0.47 (L) 07/09/2016  She was on Lisinopril  before >> hypotension >> stopped - continues off. - last set of lipids: Lab Results  Component Value Date   CHOL 216 (H) 07/09/2016   HDL 38.90 (L) 07/09/2016   LDLCALC 51 06/22/2015   LDLDIRECT 147.0 07/09/2016   TRIG 295.0 (H) 07/09/2016   CHOLHDL 6 07/09/2016  She is on atorvastatin 40 >> but not taking as nausea in am and insomnia. - last eye exam was in 05/2016. No DR. + small cataract L eye.  - no numbness and tingling in her feet.  She also has HL.   ROS: Constitutional: + weight gain, no fatigue, no subjective hyperthermia/hypothermia Eyes: no blurry vision, no xerophthalmia ENT: no sore throat, no nodules palpated in throat, no dysphagia/odynophagia, no hoarseness Cardiovascular: no CP/SOB/palpitations/leg swelling Respiratory: no cough/SOB Gastrointestinal: no N/V/D/C Musculoskeletal: no muscle/+ joint aches Skin: no rashes Neurological: no tremors/numbness/no tingling/dizziness  I reviewed pt's medications, allergies, PMH, social hx, family hx, and changes were documented in the history of present illness. Otherwise, unchanged from my initial visit note.  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  Past Medical History:  Diagnosis Date  . Diabetes mellitus   . Hyperlipidemia   . Hypertension    Past Surgical History:  Procedure Laterality Date  . CHOLECYSTECTOMY     Social History   Social  History  . Marital Status: Single, separated    Spouse Name: N/A  . Number of Children: 2   Occupational History  .    Social History Main Topics  . Smoking status: Never Smoker   . Smokeless tobacco: Never Used  . Alcohol Use: No  . Drug Use: No   Current Outpatient Prescriptions on File Prior to Visit  Medication Sig Dispense Refill  . ACCU-CHEK SOFTCLIX LANCETS lancets Use as instructed 3x a day 300 each 3  . Albiglutide 50 MG PEN Inject 50 mg once a week under skin. 12 each 3  . atorvastatin (LIPITOR) 40 MG tablet Take 1 tablet (40 mg total) by mouth daily. 90  tablet 3  . canagliflozin (INVOKANA) 300 MG TABS tablet Take 1 tablet (300 mg total) by mouth daily before breakfast. 30 tablet 5  . dexlansoprazole (DEXILANT) 60 MG capsule Take 60 mg by mouth daily.    Marland Kitchen glipiZIDE (GLUCOTROL) 5 MG tablet TAKE ONE TABLET BY MOUTH TWICE DAILY BEFORE MEAL(S) 60 tablet 11  . glucose blood (ACCU-CHEK COMPACT PLUS) test strip 1 each by Other route as needed for other. Test blood sugar 3 times daily. Dx: E11.65 300 each 3  . Insulin Glargine (BASAGLAR KWIKPEN) 100 UNIT/ML SOPN Inject 0.55 mLs (55 Units total) into the skin at bedtime. 5 pen 11  . metFORMIN (GLUCOPHAGE) 1000 MG tablet Take 1 tablet (1,000 mg total) by mouth 2 (two) times daily with a meal. 60 tablet 11  . Plecanatide (TRULANCE) 3 MG TABS Take by mouth.     No current facility-administered medications on file prior to visit.    No Known Allergies Family History  Problem Relation Age of Onset  . Diabetes Mother   . Diabetes Father    PE: BP 110/80 (BP Location: Left Arm, Patient Position: Sitting)   Pulse 87   Ht 5' (1.524 m)   Wt 160 lb (72.6 kg)   LMP 09/29/2011   SpO2 95%   BMI 31.25 kg/m  Body mass index is 31.25 kg/m. Wt Readings from Last 3 Encounters:  10/09/16 160 lb (72.6 kg)  07/09/16 160 lb (72.6 kg)  04/22/16 162 lb (73.5 kg)   Constitutional: overweight, in NAD Eyes: PERRLA, EOMI, no exophthalmos ENT: moist mucous membranes, no thyromegaly, no cervical lymphadenopathy Cardiovascular: RRR, No MRG Respiratory: CTA B Gastrointestinal: abdomen soft, NT, ND, BS+ Musculoskeletal: no deformities, strength intact in all 4 Skin: moist, warm, no rashes Neurological: no tremor with outstretched hands, DTR normal in all 4  ASSESSMENT: 1. DM2, insulin-dependent, uncontrolled, without complications  2. HL  PLAN:  1. Patient with long-standing, uncontrolled diabetes, on oral antidiabetic regimen + basal insulin + GLP1 R agonist (Tanzeum) + SGLT2 R blocker (Invokana). Sugars are  still high in am >> did not add the 10 mg Glipizide before a large dinner >> advised her to start this. - I suggested to:  Patient Instructions  Please continue: - Metformin 1000 mg 2x a day - Glipizide 5 mg 2x a day before b'fast and before supper.  Please increase the dinnertime Glipizide dose to 10 mg. - Invokana 300 mg daily in am - Tanzeum 50 mg every week - Basaglar 55 units at bedtime  Please start Livalo 2 mg at night.  Please return in 3 months with your sugar log.   -  continue checking sugars at different times of the day - check 2 times a day, rotating checks - advised for yearly eye exams >> she is  UTD - refuses flu shot again - checked HbA1c today >> 7.9% (slightly better) - Return to clinic in 3 mo with sugar log  2. HL - Lipids high at last visit >> she was not compliant with her Lipitor b/c nausea and poor sleep - will try to start Livalo 2 mg.Given coupon.  Carlus Pavlov, MD PhD [facility]

## 2016-10-13 ENCOUNTER — Ambulatory Visit (INDEPENDENT_AMBULATORY_CARE_PROVIDER_SITE_OTHER): Payer: 59 | Admitting: Family Medicine

## 2016-10-13 VITALS — BP 122/75 | HR 92 | Temp 98.1°F | Resp 16 | Ht 60.0 in | Wt 158.6 lb

## 2016-10-13 DIAGNOSIS — R05 Cough: Secondary | ICD-10-CM | POA: Diagnosis not present

## 2016-10-13 DIAGNOSIS — R059 Cough, unspecified: Secondary | ICD-10-CM | POA: Insufficient documentation

## 2016-10-13 DIAGNOSIS — R051 Acute cough: Secondary | ICD-10-CM | POA: Insufficient documentation

## 2016-10-13 MED ORDER — BENZONATATE 100 MG PO CAPS: 100.0000 mg | ORAL_CAPSULE | Freq: Two times a day (BID) | ORAL | 0 refills | Status: DC | PRN

## 2016-10-13 NOTE — Patient Instructions (Addendum)
  Thank you for coming in,   I have sent in a medication called tessalon for cough.   You can also try over the counter medications such as robitussin-DM or mucinex DM.   If you develop a fever then please return.    Please feel free to call with any questions or concerns at any time, at [phone]. --Dr. Jordan LikesSchmitz    IF you received an x-ray today, you will receive an invoice from [facility]. Please contact [facility] at [phone] with questions or concerns regarding your invoice.   IF you received labwork today, you will receive an invoice from [facility]. Please contact [facility] at [phone] with questions or concerns regarding your invoice.   Our billing staff will not be able to assist you with questions regarding bills from these companies.  You will be contacted with the lab results as soon as they are available. The fastest way to get your results is to activate your My Chart account. Instructions are located on the last page of this paperwork. If you have not heard from us regarding the results in 2 weeks, please contact this office.

## 2016-10-13 NOTE — Assessment & Plan Note (Signed)
Cough most likely related to a viral etiology.  She has an underlying reflux issue but this seems to be more acute and unrelated currently. - Try Tessalon - Continue supportive care - Follow-up as needed

## 2016-10-13 NOTE — Progress Notes (Signed)
Subjective:    Patient ID: Nicole Villanueva, female    DOB: [birth year], 51 y.o.   MRN: [id number]  Chief Complaint  Patient presents with  . Cough    x4days; producing no sputum  . CHEST TIGHTNESS    PCP: Carlus PavlovGHERGHE, CRISTINA, MD  HPI  This is a 51 y.o. female who is presenting with cough. The cough started last Thursday. She denies any body aches or fevers. She reports some pain with coughing. She hasn't tried any medications. She denies any history of asthma or tobacco use. She has some history of reflux and takes dexilant for this. She denies any sick contacts. Denies any travel. No new pets. No history of allergies. No runny nose. Hasn't been started on any new medications.    Review of Systems  ROS: No unexpected weight loss, fever, chills, swelling, instability, muscle pain, numbness/tingling, redness, otherwise see HPI   Surgical: cholecystectomy, hysterectomy Social: no tobacco use, no alcohol use  Fhx: none  Patient Active Problem List   Diagnosis Date Noted  . Cough 10/13/2016  . Uncontrolled type 2 diabetes mellitus with hyperglycemia, with long-term current use of insulin (HCC) 10/09/2016  . Diabetic retinopathy (HCC) 06/13/2016  . Prolapse of female pelvic organs 06/09/2014  . Other and unspecified hyperlipidemia 03/29/2014  . Depressive disorder, not elsewhere classified 03/29/2014  . Hot flashes 03/29/2014  . Esophageal reflux 03/29/2014  . Vasovagal syndrome 03/09/2014  . Type 2 diabetes mellitus with hyperglycemia (HCC) 03/09/2014  . HTN (hypertension) 03/12/2012  . Diabetes mellitus (HCC) 03/12/2012     Prior to Admission medications   Medication Sig Start Date End Date Taking? Authorizing Provider  ACCU-CHEK SOFTCLIX LANCETS lancets Use as instructed 3x a day 04/22/16  Yes Carlus Pavlovristina Gherghe, MD  Albiglutide 50 MG PEN Inject 50 mg once a week under skin. 04/22/16  Yes Carlus Pavlovristina Gherghe, MD  atorvastatin (LIPITOR) 40 MG tablet Take 1 tablet (40 mg total) by  mouth daily. 06/22/15  Yes Carmelina DaneJeffery S Anderson, MD  canagliflozin (INVOKANA) 300 MG TABS tablet Take 1 tablet (300 mg total) by mouth daily before breakfast. 04/22/16  Yes Carlus Pavlovristina Gherghe, MD  dexlansoprazole (DEXILANT) 60 MG capsule Take 60 mg by mouth daily.   Yes Historical Provider, MD  glipiZIDE (GLUCOTROL) 5 MG tablet Take 1 tab before b'fast and 2 before dinner 10/09/16  Yes Carlus Pavlovristina Gherghe, MD  glucose blood (ACCU-CHEK COMPACT PLUS) test strip 1 each by Other route as needed for other. Test blood sugar 3 times daily. Dx: E11.65 04/22/16  Yes Carlus Pavlovristina Gherghe, MD  Insulin Glargine (BASAGLAR KWIKPEN) 100 UNIT/ML SOPN Inject 0.55 mLs (55 Units total) into the skin at bedtime. 04/22/16  Yes Carlus Pavlovristina Gherghe, MD  metFORMIN (GLUCOPHAGE) 1000 MG tablet Take 1 tablet (1,000 mg total) by mouth 2 (two) times daily with a meal. 04/22/16  Yes Carlus Pavlovristina Gherghe, MD  Pitavastatin Calcium (LIVALO) 2 MG TABS Take 1 tablet by mouth at night 10/09/16  Yes Carlus Pavlovristina Gherghe, MD  Plecanatide (TRULANCE) 3 MG TABS Take by mouth.   Yes Historical Provider, MD     No Known Allergies    Objective:   Physical Exam BP 122/75   Pulse 92   Temp 98.1 F (36.7 C) (Oral)   Resp 16   Ht 5' (1.524 m)   Wt 158 lb 9.6 oz (71.9 kg)   LMP 09/29/2011   SpO2 96%   BMI 30.97 kg/m  Gen: NAD, alert, cooperative with exam,  HEENT: NCAT, EOMI, clear conjunctiva,  oropharynx clear, supple neck, no cervical lymphadenopathy, tympanic membrane is clear and intact bilaterally, normal turbinates, no tonsillar exudates CV: RRR, good S1/S2, no murmur, no edema, capillary refill brisk  Resp: CTABL, no wheezes, non-labored Skin: no rashes, normal turgor  Neuro: no gross deficits.  Psych: , alert and oriented      Assessment & Plan:   Cough Cough most likely related to a viral etiology.  She has an underlying reflux issue but this seems to be more acute and unrelated currently. - Try Tessalon - Continue supportive care -  Follow-up as needed

## 2016-12-04 ENCOUNTER — Other Ambulatory Visit: Payer: Self-pay

## 2016-12-04 MED ORDER — PITAVASTATIN CALCIUM 2 MG PO TABS: ORAL_TABLET | ORAL | 11 refills | Status: DC

## 2017-01-06 ENCOUNTER — Ambulatory Visit: Payer: 59 | Admitting: Internal Medicine

## 2017-03-19 ENCOUNTER — Ambulatory Visit: Payer: 59 | Admitting: Internal Medicine

## 2017-03-21 ENCOUNTER — Other Ambulatory Visit: Payer: Self-pay | Admitting: Internal Medicine

## 2017-05-21 ENCOUNTER — Ambulatory Visit (INDEPENDENT_AMBULATORY_CARE_PROVIDER_SITE_OTHER): Payer: 59 | Admitting: Internal Medicine

## 2017-05-21 ENCOUNTER — Encounter: Payer: Self-pay | Admitting: Internal Medicine

## 2017-05-21 VITALS — BP 108/62 | HR 58 | Wt 159.0 lb

## 2017-05-21 DIAGNOSIS — Z794 Long term (current) use of insulin: Secondary | ICD-10-CM | POA: Diagnosis not present

## 2017-05-21 DIAGNOSIS — E785 Hyperlipidemia, unspecified: Secondary | ICD-10-CM | POA: Diagnosis not present

## 2017-05-21 DIAGNOSIS — E1165 Type 2 diabetes mellitus with hyperglycemia: Secondary | ICD-10-CM

## 2017-05-21 LAB — POCT GLYCOSYLATED HEMOGLOBIN (HGB A1C): Hemoglobin A1C: 10.8

## 2017-05-21 MED ORDER — ROSUVASTATIN CALCIUM 10 MG PO TABS
10.0000 mg | ORAL_TABLET | Freq: Every day | ORAL | 5 refills | Status: DC
Start: 2017-05-21 — End: 2017-09-23

## 2017-05-21 MED ORDER — INSULIN PEN NEEDLE 32G X 4 MM MISC: 3 refills | Status: DC

## 2017-05-21 MED ORDER — INSULIN ASPART 100 UNIT/ML FLEXPEN: 8.0000 [IU] | PEN_INJECTOR | Freq: Three times a day (TID) | SUBCUTANEOUS | 5 refills | Status: DC

## 2017-05-21 NOTE — Patient Instructions (Addendum)
Please continue: - Metformin 1000 mg 2x a day - Glipizide 5 mg in am and 10 mg in pm - Tanzeum 50 mg every week - Basaglar 55 units at bedtime  Please start:  - NovoLog 8 units before a smaller meal  - NovoLog 10 units before a regular meal  - NovoLog 12 units before a smaller meal  Please inject this insulin 10-15 min before eating  Start Crestor 10 mg daily.  Please return in 1.5 months with your sugar log.

## 2017-05-21 NOTE — Progress Notes (Signed)
Patient ID: Lashawne Dura, female   DOB: September 25, 1965, 51 y.o.   MRN: [id number]  HPI: Kandyce Dieguez is a 51 y.o.-year-old female, returning for f/u for DM2, dx in ~2001, insulin-dependent since 2008, uncontrolled, without complications. Last visit 7 mo ago.  Last hemoglobin A1c was: Lab Results  Component Value Date   HGBA1C 7.9 10/09/2016   HGBA1C 8.1 07/09/2016   HGBA1C 8.7 04/22/2016  02/08/2016: HbA1c 7.8% Before last HbA1c, she was taking half of the Metformin and Glyburide doses.   She is on: - Metformin 1000 mg 2x a day - Glipizide 5 mg before b'fast and  10 mg before supper. - Tanzeum 50 mg every week - Basaglar 55 units at bedtime She was on Invokana 100 >> 300 mg in am, before breakfast - ran out of Invokana 1 mo ago (ran out >> did not refill b/c yeast inf and leg swelling)  Pt checks her sugars 1-2x a day: - am: 144-228, 274 >> 124-189, 210 (eats late dinners ) >> 105-160 >> 235-260s - 2h after b'fast: n/c >> 69 (took Glipizide 1.5 hrs before b'fast!) >> n/c - before lunch: n/c >> 99 >> 193 >> 89 >> n/c >> 77 >> n/c >> 200s - 2h after lunch: 170, 220-290 >> 65 >> 276 >> 81 >> n/c  - before dinner: 290s >> 166 >> n/c - 2h after dinner: n/c - bedtime: n/c >> 134-210 >> 156 >> n/c >> 114-176 >> 179-180 >> 210--275 - nighttime: n/c Lowest sugar was 105 >> 173; she has hypoglycemia awareness at 70s Highest sugar was 189 >> 275  Glucometer: Accu-Chek Aviva  Pt's meals are: - Breakfast: 1 poached egg + banana , no bread; but mostly smoothie with almond milk + strawberries + protein powder; or skips - Lunch: meat + veggies + starch, occasionally dessert, fruit - Dinner: meat + veggies + starch, occasionally dessert - Snacks: no Drinks unsweet tea, water  - No CKD, last BUN/creatinine:  Lab Results  Component Value Date   BUN 17 07/09/2016   CREATININE 0.47 (L) 07/09/2016  02/08/2016: 10/0.41 She was on Lisinopril before >> hypotension >> stopped - continues off. - +  HL. last set of lipids: Lab Results  Component Value Date   CHOL 216 (H) 07/09/2016   HDL 38.90 (L) 07/09/2016   LDLCALC 51 06/22/2015   LDLDIRECT 147.0 07/09/2016   TRIG 295.0 (H) 07/09/2016   CHOLHDL 6 07/09/2016  Was on Atorvastatin >> could not change to Livalo 2 mg b/c not covered. - last eye exam was in 05/2016. No DR. + small cataract L eye. She has a new one scheduled. - no numbness and tingling in her feet.  ROS: Constitutional: + weight gain/no weight loss, no fatigue, no subjective hyperthermia, no subjective hypothermia Eyes: no blurry vision, no xerophthalmia ENT: no sore throat, no nodules palpated in throat, + dysphagia, no odynophagia, no hoarseness Cardiovascular: no CP/no SOB/no palpitations/no leg swelling Respiratory: no cough/no SOB/no wheezing Gastrointestinal: no N/no V/no D/no C/no acid reflux Musculoskeletal: no muscle aches/no joint aches Skin: no rashes, no hair loss Neurological: no tremors/no numbness/no tingling/no dizziness  I reviewed pt's medications, allergies, PMH, social hx, family hx, and changes were documented in the history of present illness. Otherwise, unchanged from my initial visit note.   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  Past Medical History:  Diagnosis Date  . Diabetes mellitus   . Hyperlipidemia   . Hypertension    Past Surgical History:  Procedure Laterality Date  .  CHOLECYSTECTOMY     Social History   Social History  . Marital Status: Single, separated    Spouse Name: N/A  . Number of Children: 2   Occupational History  .    Social History Main Topics  . Smoking status: Never Smoker   . Smokeless tobacco: Never Used  . Alcohol Use: No  . Drug Use: No   Current Outpatient Prescriptions on File Prior to Visit  Medication Sig Dispense Refill  . ACCU-CHEK SOFTCLIX LANCETS lancets Use as instructed 3x a day 300 each 3  . Albiglutide 50 MG PEN Inject 50 mg once a week under skin. 12 each 3  . atorvastatin  (LIPITOR) 40 MG tablet Take 1 tablet (40 mg total) by mouth daily. 90 tablet 3  . benzonatate (TESSALON) 100 MG capsule Take 1 capsule (100 mg total) by mouth 2 (two) times daily as needed for cough. 20 capsule 0  . dexlansoprazole (DEXILANT) 60 MG capsule Take 60 mg by mouth daily.    Marland Kitchen glipiZIDE (GLUCOTROL) 5 MG tablet Take 1 tab before b'fast and 2 before dinner 90 tablet 3  . glucose blood (ACCU-CHEK COMPACT PLUS) test strip 1 each by Other route as needed for other. Test blood sugar 3 times daily. Dx: E11.65 300 each 3  . Insulin Glargine (BASAGLAR KWIKPEN) 100 UNIT/ML SOPN Inject 0.55 mLs (55 Units total) into the skin at bedtime. 5 pen 11  . INVOKANA 300 MG TABS tablet TAKE ONE TABLET BY MOUTH ONCE DAILY BEFORE BREAKFAST 30 tablet 5  . metFORMIN (GLUCOPHAGE) 1000 MG tablet Take 1 tablet (1,000 mg total) by mouth 2 (two) times daily with a meal. 60 tablet 11  . Pitavastatin Calcium (LIVALO) 2 MG TABS Take 1 tablet by mouth at night 30 tablet 11  . Plecanatide (TRULANCE) 3 MG TABS Take by mouth.     No current facility-administered medications on file prior to visit.    No Known Allergies Family History  Problem Relation Age of Onset  . Diabetes Mother   . Diabetes Father    PE: BP 108/62 (BP Location: Left Arm, Patient Position: Sitting)   Pulse (!) 58   Wt 159 lb (72.1 kg)   LMP 09/29/2011   SpO2 97%   BMI 31.05 kg/m  Body mass index is 31.05 kg/m. Wt Readings from Last 3 Encounters:  05/21/17 159 lb (72.1 kg)  10/13/16 158 lb 9.6 oz (71.9 kg)  10/09/16 160 lb (72.6 kg)   Constitutional: overweight, in NAD Eyes: PERRLA, EOMI, no exophthalmos ENT: moist mucous membranes, no thyromegaly, no cervical lymphadenopathy Cardiovascular: RRR, No MRG Respiratory: CTA B Gastrointestinal: abdomen soft, NT, ND, BS+ Musculoskeletal: no deformities, strength intact in all 4 Skin: moist, warm, no rashes Neurological: no tremor with outstretched hands, DTR normal in all  4  ASSESSMENT: 1. DM2, insulin-dependent, uncontrolled, without complications  2. HL  PLAN:  1. Patient with long-standing, uncontrolled diabetes, with very poor control after stopping Invokana 1 mo ago. She would not want to restart 2/2 leg swelling and vaginal infections. As all sugars are very high >> will need to start mealtime insulin. Will give her a flexible regimen. - I suggested to:  Patient Instructions  Please continue: - Metformin 1000 mg 2x a day - Glipizide 5 mg in am and 10 mg in pm - Tanzeum 50 mg every week - Basaglar 55 units at bedtime  Please start:  - NovoLog 8 units before a smaller meal  - NovoLog 10 units before  a regular meal  - NovoLog 12 units before a smaller meal  Please inject this insulin 10-15 min before eating  Start Crestor 10 mg daily.  Please return in 1.5 months with your sugar log.   - today, HbA1c is 10.8% (HIGHER). - continue checking sugars at different times of the day - check 1-2x a day, rotating checks - advised for yearly eye exams >> she is UTD - Return to clinic in 1.5 mo with sugar log   2. HL - Lipids high as she was not compliant with her Lipitor b/c nausea and poor sleep >> I suggested Livalo 2 mg per day at last visit but this was not covered >> will send Rosuvastatin 10 mg to her pharmacy.  Carlus Pavlov, MD PhD [facility] Endocrinology

## 2017-05-27 DIAGNOSIS — E119 Type 2 diabetes mellitus without complications: Secondary | ICD-10-CM | POA: Diagnosis not present

## 2017-05-27 DIAGNOSIS — H2513 Age-related nuclear cataract, bilateral: Secondary | ICD-10-CM | POA: Diagnosis not present

## 2017-05-27 LAB — HM DIABETES EYE EXAM

## 2017-05-28 ENCOUNTER — Other Ambulatory Visit: Payer: Self-pay | Admitting: Internal Medicine

## 2017-05-28 DIAGNOSIS — E139 Other specified diabetes mellitus without complications: Secondary | ICD-10-CM

## 2017-06-02 ENCOUNTER — Telehealth: Payer: Self-pay | Admitting: Internal Medicine

## 2017-06-02 NOTE — Telephone Encounter (Signed)
Called back cover my meds, they are requesting that she needs a PA for a Novolog, but okay to change to Humalog?

## 2017-06-02 NOTE — Telephone Encounter (Signed)
Vonzella Nipple calling from cover my meds is ca;lling on the status of PA , please advise  [phone]  Ref # Morene Antu

## 2017-06-02 NOTE — Telephone Encounter (Signed)
OK to change?

## 2017-06-03 ENCOUNTER — Other Ambulatory Visit: Payer: Self-pay

## 2017-06-03 MED ORDER — INSULIN LISPRO 100 UNIT/ML (KWIKPEN): PEN_INJECTOR | SUBCUTANEOUS | 1 refills | Status: DC

## 2017-06-03 NOTE — Telephone Encounter (Signed)
Submitted into pharmacy.  

## 2017-07-14 ENCOUNTER — Ambulatory Visit: Payer: 59 | Admitting: Internal Medicine

## 2017-09-22 ENCOUNTER — Ambulatory Visit: Payer: 59 | Admitting: Internal Medicine

## 2017-09-22 ENCOUNTER — Encounter: Payer: Self-pay | Admitting: Internal Medicine

## 2017-09-22 VITALS — BP 118/62 | HR 75 | Ht 60.0 in | Wt 159.0 lb

## 2017-09-22 DIAGNOSIS — Z794 Long term (current) use of insulin: Secondary | ICD-10-CM

## 2017-09-22 DIAGNOSIS — E785 Hyperlipidemia, unspecified: Secondary | ICD-10-CM | POA: Diagnosis not present

## 2017-09-22 DIAGNOSIS — E1165 Type 2 diabetes mellitus with hyperglycemia: Secondary | ICD-10-CM

## 2017-09-22 LAB — COMPLETE METABOLIC PANEL WITH GFR
AG Ratio: 1.2 (calc) (ref 1.0–2.5)
ALT: 46 U/L — ABNORMAL HIGH (ref 6–29)
AST: 38 U/L — ABNORMAL HIGH (ref 10–35)
Albumin: 4.2 g/dL (ref 3.6–5.1)
Alkaline phosphatase (APISO): 94 U/L (ref 33–130)
BUN/Creatinine Ratio: 23 (calc) — ABNORMAL HIGH (ref 6–22)
BUN: 10 mg/dL (ref 7–25)
CO2: 28 mmol/L (ref 20–32)
Calcium: 9.7 mg/dL (ref 8.6–10.4)
Chloride: 101 mmol/L (ref 98–110)
Creat: 0.43 mg/dL — ABNORMAL LOW (ref 0.50–1.05)
GFR, Est African American: 137 mL/min/{1.73_m2} (ref >=60)
GFR, Est Non African American: 119 mL/min/{1.73_m2} (ref >=60)
Globulin: 3.4 g/dL (calc) (ref 1.9–3.7)
Glucose, Bld: 134 mg/dL — ABNORMAL HIGH (ref 65–99)
Potassium: 4.2 mmol/L (ref 3.5–5.3)
Sodium: 137 mmol/L (ref 135–146)
Total Bilirubin: 0.3 mg/dL (ref 0.2–1.2)
Total Protein: 7.6 g/dL (ref 6.1–8.1)

## 2017-09-22 MED ORDER — FREESTYLE LIBRE 14 DAY READER DEVI: 1.0000 | Freq: Once | 1 refills | Status: AC

## 2017-09-22 MED ORDER — FREESTYLE LIBRE 14 DAY SENSOR MISC: 1.0000 | 11 refills | Status: DC

## 2017-09-22 NOTE — Patient Instructions (Addendum)
Please continue: - Metformin 1000 mg 2x a day - Glipizide 5 mg in am and 10 mg in pm - Tanzeum 50 mg every week - Basaglar 55 units at bedtime  Please use Humalog before every meal: 8 units before a smaller meal 10 units before a regular meal 12 units before a smaller meal  Please inject 10-15 min before eating  Continue  Crestor 10 mg daily.  Please return in 3 months with your sugar log.

## 2017-09-22 NOTE — Progress Notes (Signed)
Patient ID: Nicole Villanueva, female   DOB: October 23, 1965, 52 y.o.   MRN: [id number]  HPI: Nicole Villanueva is a 52 y.o.-year-old female, returning for f/u for DM2, dx in ~2001, insulin-dependent since 2008, uncontrolled, without long term complications. Last visit 4 mo ago.  Last hemoglobin A1c was: Lab Results  Component Value Date   HGBA1C 10.8 05/21/2017   HGBA1C 7.9 10/09/2016   HGBA1C 8.1 07/09/2016  02/08/2016: HbA1c 7.8% Before last HbA1c, she was taking half of the Metformin and Glyburide doses.   She is on: - Metformin 1000 mg 2x a day - Glipizide 5 mg in am and 10 mg in pm - Tanzeum 50 mg every week - Basaglar 55 units at bedtime  - Humalog  - started 04/2017 8 units before a smaller meal 10 units before a regular meal 12 units before a smaller meal  Please inject NovoLog 10-15 min before eating She was on Invokana 100 >> 300 mg in am, before breakfast - ran out of Invokana 1 mo ago (ran out >> did not refill b/c yeast inf and leg swelling)  Pt checks her sugars 1-2x a day: - am: 105-160 >> 235-260s >> 167-233 - 2h after b'fast:69  >> n/c - before lunch: 77 >> n/c >> 200s >> 106-136, 226 - 2h after lunch: 65 >> 276 >> 81 >> n/c  - before dinner: 290s >> 166 >> n/c >> 122 - 2h after dinner: n/c - bedtime: 179-180 >> 210--275 >>  198-353 - nighttime: n/c Lowest sugar was 105 >> 173 >> 106; she has hypoglycemia awareness in the 70s. Highest sugar was 189 >> 275 >> 353.  Glucometer: Accu-Chek Aviva  Pt's meals are: - Breakfast: 1 poached egg + banana , no bread; but mostly smoothie with almond milk + strawberries + protein powder; or skips - Lunch: meat + veggies + starch, occasionally dessert, fruit - Dinner: meat + veggies + starch, occasionally dessert - Snacks: no Drinks unsweet tea, water  - No CKD, last BUN/creatinine:  Lab Results  Component Value Date   BUN 17 07/09/2016   CREATININE 0.47 (L) 07/09/2016  02/08/2016: 10/0.41 She was on Lisinopril before >>  hypotension >> now off - + HL. last set of lipids: Lab Results  Component Value Date   CHOL 216 (H) 07/09/2016   HDL 38.90 (L) 07/09/2016   LDLCALC 51 06/22/2015   LDLDIRECT 147.0 07/09/2016   TRIG 295.0 (H) 07/09/2016   CHOLHDL 6 07/09/2016  Was on Atorvastatin >> could not change to Livalo 2 mg b/c not covered. We started Crestor at last visit, 10 mg daily. No mm aches. - last eye exam was in 05/2017. No DR. + small cataract L eye.  - no numbness and tingling in her feet.  ROS: Constitutional: no weight gain/no weight loss, no fatigue, no subjective hyperthermia, no subjective hypothermia Eyes: no blurry vision, no xerophthalmia ENT: no sore throat, no nodules palpated in throat, no dysphagia, no odynophagia, no hoarseness Cardiovascular: no CP/no SOB/no palpitations/no leg swelling Respiratory: no cough/no SOB/no wheezing Gastrointestinal: no N/no V/no D/no C/no acid reflux Musculoskeletal: no muscle aches/no joint aches Skin: no rashes, no hair loss Neurological: no tremors/no numbness/no tingling/no dizziness  I reviewed pt's medications, allergies, PMH, social hx, family hx, and changes were documented in the history of present illness. Otherwise, unchanged from my initial visit note.  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  Past Medical History:  Diagnosis Date  . Diabetes mellitus   . Hyperlipidemia   .  Hypertension    Past Surgical History:  Procedure Laterality Date  . CHOLECYSTECTOMY     Social History   Social History  . Marital Status: Single, separated    Spouse Name: N/A  . Number of Children: 2   Occupational History  .    Social History Main Topics  . Smoking status: Never Smoker   . Smokeless tobacco: Never Used  . Alcohol Use: No  . Drug Use: No   Current Outpatient Medications on File Prior to Visit  Medication Sig Dispense Refill  . ACCU-CHEK SOFTCLIX LANCETS lancets Use as instructed 3x a day 300 each 3  . atorvastatin (LIPITOR) 40 MG  tablet Take 1 tablet (40 mg total) by mouth daily. 90 tablet 3  . dexlansoprazole (DEXILANT) 60 MG capsule Take 60 mg by mouth daily.    Marland Kitchen glipiZIDE (GLUCOTROL) 5 MG tablet TAKE ONE TABLET BY MOUTH BEFORE BREAKFAST AND 2 TABLETS BEFORE DINNER 90 tablet 3  . glucose blood (ACCU-CHEK COMPACT PLUS) test strip 1 each by Other route as needed for other. Test blood sugar 3 times daily. Dx: E11.65 300 each 3  . Insulin Glargine (BASAGLAR KWIKPEN) 100 UNIT/ML SOPN INJECT 0.55 MLS(55 UNITS TOTAL) INTO THE SKIN AT BEDTIME 15 pen 11  . insulin lispro (HUMALOG KWIKPEN) 100 UNIT/ML KiwkPen Inject 8-12 units into the skin 3 times daily with meals. 30 mL 1  . Insulin Pen Needle (CAREFINE PEN NEEDLES) 32G X 4 MM MISC Use 4x a day 300 each 3  . INVOKANA 300 MG TABS tablet TAKE ONE TABLET BY MOUTH ONCE DAILY BEFORE BREAKFAST 30 tablet 5  . metFORMIN (GLUCOPHAGE) 1000 MG tablet TAKE ONE TABLET BY MOUTH TWICE DAILY WITH A MEAL 60 tablet 11  . Plecanatide (TRULANCE) 3 MG TABS Take by mouth.    . rosuvastatin (CRESTOR) 10 MG tablet Take 1 tablet (10 mg total) by mouth daily. 30 tablet 5  . TANZEUM 50 MG PEN INJECT 50 MG ONCE A WEEK UNDER THE SKIN 4 each 11   No current facility-administered medications on file prior to visit.    No Known Allergies Family History  Problem Relation Age of Onset  . Diabetes Mother   . Diabetes Father    PE: BP 118/62   Pulse 75   Ht 5' (1.524 m)   Wt 159 lb (72.1 kg)   LMP 09/29/2011   SpO2 97%   BMI 31.05 kg/m  Body mass index is 31.05 kg/m. Wt Readings from Last 3 Encounters:  09/22/17 159 lb (72.1 kg)  05/21/17 159 lb (72.1 kg)  10/13/16 158 lb 9.6 oz (71.9 kg)   Constitutional: overweight, in NAD Eyes: PERRLA, EOMI, no exophthalmos ENT: moist mucous membranes, no thyromegaly, no cervical lymphadenopathy Cardiovascular: RRR, No MRG Respiratory: CTA B Gastrointestinal: abdomen soft, NT, ND, BS+ Musculoskeletal: no deformities, strength intact in all 4 Skin:  moist, warm, no rashes Neurological: no tremor with outstretched hands, DTR normal in all 4  ASSESSMENT: 1. DM2, insulin-dependent, uncontrolled, without complications  2. HL  PLAN:  1. Patient with long-standing, uncontrolled, diabetes, with very poor diabetes control after stopping Invokana.  She had leg swelling and vaginal infections.  At last visit, as her HbA1c was high, at 10.8%, we started mealtime insulin.  Unfortunately, she has not been taking this consistently and her sugars are still very high.  She tells me that it is difficult for her to take her insulin at work, however, per review of her log, she is also  not taking it with dinner, which she usually has at home.  Subsequently, the sugars at bedtime are very high, and they remain high overnight resulting in fasting hyperglycemia.  We discussed that she absolutely needs to start taking the insulin before every meal.  I offered to write her a letter for work so that she is allowed time to eat and take her insulin as prescribed.  She refuses one for now but will let me know if she needs it. - She is still getting Tanzeum, but we discussed that this was discontinued-and may need to switch to another GLP-1 soon - I suggested a freestyle libre CGM sent a prescription to her pharmacy - I also suggested a Vgo patch pump, but she did not seem to excited about this. - I suggested to:  Patient Instructions  Please continue: - Metformin 1000 mg 2x a day - Glipizide 5 mg in am and 10 mg in pm - Tanzeum 50 mg every week - Basaglar 55 units at bedtime  Please use Humalog before every meal: 8 units before a smaller meal 10 units before a regular meal 12 units before a smaller meal  Please inject 10-15 min before eating  Continue  Crestor 10 mg daily.  Please return in 3 months with your sugar log.    - At last visit, HbA1c was higher, 10.8%  - now: Still 10.8%, stable - continue checking sugars at different times of the day - check 3x  a day, rotating checks - advised for yearly eye exams >> she is UTD - Return to clinic in 3 mo with sugar log   2. HL -Patient with high lipids as she was not compliant with her Lipitor in the past because of nausea and poor sleep.  At last visit, I sent a prescription for Crestor to her pharmacy.  She takes 10 mg without side effects. - We will recheck a lipid panel now  Component     Latest Ref Rng & Units 09/22/2017          Glucose     65 - 99 mg/dL 161134 (H)  BUN     7 - 25 mg/dL 10  Creatinine     0.960.50 - 1.05 mg/dL 0.450.43 (L)  GFR, Est Non African American     > OR = 60 mL/min/1.3473m2 119  GFR, Est African American     > OR = 60 mL/min/1.2373m2 137  BUN/Creatinine Ratio     6 - 22 (calc) 23 (H)  Sodium     135 - 146 mmol/L 137  Potassium     3.5 - 5.3 mmol/L 4.2  Chloride     98 - 110 mmol/L 101  CO2     20 - 32 mmol/L 28  Calcium     8.6 - 10.4 mg/dL 9.7  Total Protein     6.1 - 8.1 g/dL 7.6  Albumin MSPROF     3.6 - 5.1 g/dL 4.2  Globulin     1.9 - 3.7 g/dL (calc) 3.4  AG Ratio     1.0 - 2.5 (calc) 1.2  Total Bilirubin     0.2 - 1.2 mg/dL 0.3  Alkaline phosphatase (APISO)     33 - 130 U/L 94  AST     10 - 35 U/L 38 (H)  ALT     6 - 29 U/L 46 (H)  Cholesterol     0 - 200 mg/dL 409248 (H)  Triglycerides  0.0 - 149.0 mg/dL 161.0 Triglyceride is over 400; calculations on Lipids are invalid. (H)  HDL Cholesterol     >39.00 mg/dL 96.04 (L)  Total CHOL/HDL Ratio      8  Hemoglobin A1C        Direct LDL     mg/dL 540.9   Elevated TG, LDL and liver enzymes (likely fatty liver). Needs f/u with PCP for the liver enzymes. Needs improved DM control which will help improve his lipid levels. Will increase Crestor to 40 mg daily.  Kidney fxn normal.  Carlus Pavlov, MD PhD [facility] Endocrinology

## 2017-09-23 ENCOUNTER — Other Ambulatory Visit: Payer: Self-pay | Admitting: Internal Medicine

## 2017-09-23 LAB — LIPID PANEL
Cholesterol: 248 mg/dL — ABNORMAL HIGH (ref 0–200)
HDL: 32.8 mg/dL — ABNORMAL LOW (ref >39.00)
Total CHOL/HDL Ratio: 8
Triglycerides: 459 mg/dL — ABNORMAL HIGH (ref 0.0–149.0)

## 2017-09-23 LAB — POCT GLYCOSYLATED HEMOGLOBIN (HGB A1C): Hemoglobin A1C: 10.8

## 2017-09-23 LAB — LDL CHOLESTEROL, DIRECT: Direct LDL: 147 mg/dL

## 2017-09-23 MED ORDER — ROSUVASTATIN CALCIUM 40 MG PO TABS: 40.0000 mg | ORAL_TABLET | Freq: Every day | ORAL | 3 refills | Status: DC

## 2017-12-21 ENCOUNTER — Ambulatory Visit: Payer: 59 | Admitting: Internal Medicine

## 2018-01-04 ENCOUNTER — Ambulatory Visit: Payer: 59 | Admitting: Internal Medicine

## 2018-01-04 ENCOUNTER — Encounter: Payer: Self-pay | Admitting: Internal Medicine

## 2018-01-04 VITALS — BP 102/68 | HR 88 | Ht 60.0 in | Wt 163.4 lb

## 2018-01-04 DIAGNOSIS — E785 Hyperlipidemia, unspecified: Secondary | ICD-10-CM | POA: Diagnosis not present

## 2018-01-04 DIAGNOSIS — Z794 Long term (current) use of insulin: Secondary | ICD-10-CM

## 2018-01-04 DIAGNOSIS — E1165 Type 2 diabetes mellitus with hyperglycemia: Secondary | ICD-10-CM | POA: Diagnosis not present

## 2018-01-04 LAB — POCT GLYCOSYLATED HEMOGLOBIN (HGB A1C): Hemoglobin A1C: 10.2

## 2018-01-04 MED ORDER — SEMAGLUTIDE (1 MG/DOSE) 2 MG/1.5ML ~~LOC~~ SOPN: 1.0000 mg | PEN_INJECTOR | SUBCUTANEOUS | 5 refills | Status: DC

## 2018-01-04 MED ORDER — INSULIN LISPRO 100 UNIT/ML (KWIKPEN): PEN_INJECTOR | SUBCUTANEOUS | 3 refills | Status: DC

## 2018-01-04 NOTE — Progress Notes (Signed)
Patient ID: Nicole Villanueva, female   DOB: [birth year], 52 y.o.   MRN: [id number]  HPI: Nicole Villanueva is a 52 y.o.-year-old female, returning for f/u for DM2, dx in ~2001, insulin-dependent since 2008, uncontrolled, without long term complications. Last visit 4 months ago.  Last hemoglobin A1c was: Lab Results  Component Value Date   HGBA1C 10.8 09/23/2017   HGBA1C 10.8 05/21/2017   HGBA1C 7.9 10/09/2016  02/08/2016: HbA1c 7.8%  She is on: - Metformin 1000 mg 2x a day - Glipizide 5 mg in am and 10 mg in pm - Tanzeum 50 mg every week  - Basaglar 55 units at bedtime - Humalog: 8 units before a smaller meal 10 units before a regular meal 12 units before a smaller meal  Please inject 10-15 min before eating She was on Invokana 100 >> 300 mg in am, before breakfast - ran out of Invokana 1 mo ago (ran out >> did not refill b/c yeast inf and leg swelling)  Pt checks her sugars 1-2 times a day >> higher: - am: 105-160 >> 235-260s >> 167-233 >> 214-357 - 2h after b'fast:69  >> n/c - before lunch: 77 >> n/c >> 200s >> 106-136, 226 >> 114 - 2h after lunch: 65 >> 276 >> 81 >> n/c >> 194, 301 - before dinner: 290s >> 166 >> n/c >> 122 >> n/c  - 2h after dinner: n/c >> 242, 246 - bedtime: 179-180 >> 210--275 >>  198-353 >> 202-332 - nighttime: n/c Lowest sugar was 105 >> 173 >> 106 >> 114; she has hypoglycemia awareness in the 70s. Highest sugar was 189 >> 275 >> 353 >> 367.  Glucometer: Accu-Chek Aviva  Pt's meals are: - Breakfast: 1 poached egg + banana , no bread; but mostly smoothie with almond milk + strawberries + protein powder; or skips - Lunch: meat + veggies + starch, occasionally dessert, fruit - Dinner: meat + veggies + starch, occasionally dessert - Snacks: no Drinks unsweet tea, water  -No CKD, last BUN/creatinine:  Lab Results  Component Value Date   BUN 10 09/22/2017   CREATININE 0.43 (L) 09/22/2017  02/08/2016: 10/0.41 She was on lisinopril before but developed  hypotension some she is now off. - + HL. last set of lipids: Lab Results  Component Value Date   CHOL 248 (H) 09/22/2017   HDL 32.80 (L) 09/22/2017   LDLCALC 51 06/22/2015   LDLDIRECT 147.0 09/22/2017   TRIG (H) 09/22/2017    459.0 Triglyceride is over 400; calculations on Lipids are invalid.   CHOLHDL 8 09/22/2017  Was on Atorvastatin >> could not change to Livalo 2 mg b/c not covered.  We started Crestor and then increase the dose to 40 mg daily.  No side effects - last eye exam was in 05/2017: No DR. + small cataract L eye.  - no numbness and tingling in her feet.  ROS: Constitutional: + weight gain/no weight loss, no fatigue, no subjective hyperthermia, no subjective hypothermia Eyes: no blurry vision, no xerophthalmia ENT: no sore throat, no nodules palpated in throat, no dysphagia, no odynophagia, no hoarseness Cardiovascular: no CP/no SOB/no palpitations/no leg swelling Respiratory: no cough/no SOB/no wheezing Gastrointestinal: no N/no V/no D/no C/no acid reflux Musculoskeletal: no muscle aches/no joint aches Skin: no rashes, no hair loss Neurological: no tremors/no numbness/no tingling/no dizziness  I reviewed pt's medications, allergies, PMH, social hx, family hx, and changes were documented in the history of present illness. Otherwise, unchanged from my initial visit note.  Cousin has  Papillary Thyroid Cancer.  Past Medical History:  Diagnosis Date  . Diabetes mellitus   . Hyperlipidemia   . Hypertension    Past Surgical History:  Procedure Laterality Date  . CHOLECYSTECTOMY     Social History   Social History  . Marital Status: Single, separated    Spouse Name: N/A  . Number of Children: 2   Occupational History  .    Social History Main Topics  . Smoking status: Never Smoker   . Smokeless tobacco: Never Used  . Alcohol Use: No  . Drug Use: No   Current Outpatient Medications on File Prior to Visit  Medication Sig Dispense Refill  . ACCU-CHEK  SOFTCLIX LANCETS lancets Use as instructed 3x a day 300 each 3  . atorvastatin (LIPITOR) 40 MG tablet Take 1 tablet (40 mg total) by mouth daily. 90 tablet 3  . Continuous Blood Gluc Sensor (FREESTYLE LIBRE 14 DAY SENSOR) MISC 1 each by Does not apply route every 14 (fourteen) days. Change every 2 weeks 2 each 11  . glipiZIDE (GLUCOTROL) 5 MG tablet TAKE ONE TABLET BY MOUTH BEFORE BREAKFAST AND 2 TABLETS BEFORE DINNER 90 tablet 3  . glucose blood (ACCU-CHEK COMPACT PLUS) test strip 1 each by Other route as needed for other. Test blood sugar 3 times daily. Dx: E11.65 300 each 3  . Insulin Glargine (BASAGLAR KWIKPEN) 100 UNIT/ML SOPN INJECT 0.55 MLS(55 UNITS TOTAL) INTO THE SKIN AT BEDTIME 15 pen 11  . insulin lispro (HUMALOG KWIKPEN) 100 UNIT/ML KiwkPen Inject 8-12 units into the skin 3 times daily with meals. 30 mL 1  . Insulin Pen Needle (CAREFINE PEN NEEDLES) 32G X 4 MM MISC Use 4x a day 300 each 3  . metFORMIN (GLUCOPHAGE) 1000 MG tablet TAKE ONE TABLET BY MOUTH TWICE DAILY WITH A MEAL 60 tablet 11  . Plecanatide (TRULANCE) 3 MG TABS Take by mouth.    Marland Kitchen TANZEUM 50 MG PEN INJECT 50 MG ONCE A WEEK UNDER THE SKIN 4 each 11  . dexlansoprazole (DEXILANT) 60 MG capsule Take 60 mg by mouth daily.    . rosuvastatin (CRESTOR) 40 MG tablet Take 1 tablet (40 mg total) by mouth daily. (Patient not taking: Reported on 01/04/2018) 90 tablet 3   No current facility-administered medications on file prior to visit.    No Known Allergies Family History  Problem Relation Age of Onset  . Diabetes Mother   . Diabetes Father    PE: BP 102/68   Pulse 88   Ht 5' (1.524 m)   Wt 163 lb 6.4 oz (74.1 kg)   LMP 09/29/2011   SpO2 98%   BMI 31.91 kg/m  Body mass index is 31.91 kg/m. Wt Readings from Last 3 Encounters:  01/04/18 163 lb 6.4 oz (74.1 kg)  09/22/17 159 lb (72.1 kg)  05/21/17 159 lb (72.1 kg)   Constitutional: overweight, in NAD Eyes: PERRLA, EOMI, no exophthalmos ENT: moist mucous membranes,  no thyromegaly, no cervical lymphadenopathy Cardiovascular: RRR, No MRG Respiratory: CTA B Gastrointestinal: abdomen soft, NT, ND, BS+ Musculoskeletal: no deformities, strength intact in all 4 Skin: moist, warm, no rashes Neurological: no tremor with outstretched hands, DTR normal in all 4  ASSESSMENT: 1. DM2, insulin-dependent, uncontrolled, without complications  2. HL  PLAN:  1. Patient with long-standing, uncontrolled, type 2 diabetes, with very poor diabetes control after stopping Invokana.  She had leg swelling and vaginal infections, so we could not continue.  At last visit, HbA1c was 10.8%.  This  did not improve even as we started mealtime insulin before the last appointment.  At this visit, they are even higher.  She is trying her best to take the insulin before every meal.  At last visit, she was telling me that she was not taking this consistently as she could not take her insulin at work however, she was also missing insulin at home.  At last visit, I offered to write a letter for her so she can get the injection of insulin at work, but she refused at that time.  We did discuss about the Vgo patch pump in the past, but she did not seem excited about this. - We again discussed about the absolute need to take the insulin before every meal.  However, since her sugars are worse, we will escalate the doses of Humalog and, since Tanzeum is out of the market, we will switch to Ozempic and increase to a higher dose, of 1 mg weekly.  I advised her to get in touch with me if the sugars are still high before next visit. - I suggested to:  Patient Instructions  Please continue: - Metformin 1000 mg 2x a day - Glipizide 5 mg in am and 10 mg in pm - Basaglar 55 units at bedtime  Please increase: - Humalog: 12 units before a smaller meal 15 units before a regular meal 18 units before a smaller meal  Please inject 10-15 min before eating  Please start: - Ozempic 1 mg weekly  Continue  Crestor 40 mg daily.  Please return in 3-4 months with your sugar log.    - today, HbA1c is 10.2% (still very high) - continue checking sugars at different times of the day - check 3x a day, rotating checks - advised for yearly eye exams >> she is UTD - Return to clinic in 3 mo with sugar log   2. HL - Reviewed latest lipid panel from last visit: LDL at goal, but triglycerides very high and HDL low - We increased Crestor at that time.  She is currently on 40 mg daily. Continues the statin without side effects.  In the past, she had nausea and poor sleep from Lipitor. - will recheck Lipids at next visit, after her DM improves more  Carlus Pavlov, MD PhD [facility]

## 2018-01-04 NOTE — Patient Instructions (Addendum)
Please continue: - Metformin 1000 mg 2x a day - Glipizide 5 mg in am and 10 mg in pm - Basaglar 55 units at bedtime  Please increase: - Humalog: 12 units before a smaller meal 15 units before a regular meal 18 units before a smaller meal  Please inject 10-15 min before eating  Please start: - Ozempic 1 mg weekly  Continue Crestor 40 mg daily.  Please return in 3-4 months with your sugar log.

## 2018-02-02 ENCOUNTER — Encounter: Payer: Self-pay | Admitting: Physician Assistant

## 2018-02-02 ENCOUNTER — Ambulatory Visit: Payer: 59 | Admitting: Physician Assistant

## 2018-02-02 ENCOUNTER — Telehealth: Payer: Self-pay | Admitting: Physician Assistant

## 2018-02-02 ENCOUNTER — Other Ambulatory Visit: Payer: Self-pay

## 2018-02-02 VITALS — BP 118/79 | HR 87 | Temp 97.9°F | Resp 18 | Ht 59.41 in | Wt 158.2 lb

## 2018-02-02 DIAGNOSIS — K469 Unspecified abdominal hernia without obstruction or gangrene: Secondary | ICD-10-CM | POA: Diagnosis not present

## 2018-02-02 DIAGNOSIS — B351 Tinea unguium: Secondary | ICD-10-CM

## 2018-02-02 MED ORDER — CICLOPIROX 8 % EX KIT: 1.0000 "application " | PACK | Freq: Every day | CUTANEOUS | 1 refills | Status: DC

## 2018-02-02 NOTE — Progress Notes (Signed)
Nicole Villanueva  MRN: [id number] DOB: October 24, 1965  PCP: Patient, No Pcp Per  Subjective:  Pt is a 52 year old female who presents to clinic for abdominal pain x 1 day. Pain is located in her lower left abdomen.  Pain is intermittent. Worse with movement or bending. She can feel the area when she is walking, but no pain. Pain does not radiate. Feels like a "tugging".  She has not done anything to feel better.  ROS below.   H/o uterovaginal prolapses repair surgery 01/2016.  Review of Systems  Constitutional: Negative for chills, diaphoresis, fatigue and fever.  Gastrointestinal: Positive for abdominal pain. Negative for abdominal distention, anal bleeding, blood in stool, constipation, diarrhea, nausea and vomiting.  Skin: Negative.     Patient Active Problem List   Diagnosis Date Noted  . Cough 10/13/2016  . Uncontrolled type 2 diabetes mellitus with hyperglycemia, with long-term current use of insulin ([place]) 10/09/2016  . Diabetic retinopathy ([place]) 06/13/2016  . Prolapse of female pelvic organs 06/09/2014  . Hyperlipidemia 03/29/2014  . Depressive disorder, not elsewhere classified 03/29/2014  . Hot flashes 03/29/2014  . Esophageal reflux 03/29/2014  . Vasovagal syndrome 03/09/2014  . HTN (hypertension) 03/12/2012    Current Outpatient Medications on File Prior to Visit  Medication Sig Dispense Refill  . ACCU-CHEK SOFTCLIX LANCETS lancets Use as instructed 3x a day 300 each 3  . atorvastatin (LIPITOR) 40 MG tablet Take 1 tablet (40 mg total) by mouth daily. 90 tablet 3  . Continuous Blood Gluc Sensor (FREESTYLE LIBRE 14 DAY SENSOR) MISC 1 each by Does not apply route every 14 (fourteen) days. Change every 2 weeks 2 each 11  . glipiZIDE (GLUCOTROL) 5 MG tablet TAKE ONE TABLET BY MOUTH BEFORE BREAKFAST AND 2 TABLETS BEFORE DINNER 90 tablet 3  . glucose blood (ACCU-CHEK COMPACT PLUS) test strip 1 each by Other route as needed for other. Test blood sugar 3 times daily. Dx: E11.65 300  each 3  . Insulin Glargine (BASAGLAR KWIKPEN) 100 UNIT/ML SOPN INJECT 0.55 MLS(55 UNITS TOTAL) INTO THE SKIN AT BEDTIME 15 pen 11  . insulin lispro (HUMALOG KWIKPEN) 100 UNIT/ML KiwkPen Inject 12-18 units into the skin 3 times daily with meals. 30 mL 3  . Insulin Pen Needle (CAREFINE PEN NEEDLES) 32G X 4 MM MISC Use 4x a day 300 each 3  . metFORMIN (GLUCOPHAGE) 1000 MG tablet TAKE ONE TABLET BY MOUTH TWICE DAILY WITH A MEAL 60 tablet 11  . Plecanatide (TRULANCE) 3 MG TABS Take by mouth.    . rosuvastatin (CRESTOR) 40 MG tablet Take 1 tablet (40 mg total) by mouth daily. 90 tablet 3  . Semaglutide (OZEMPIC) 1 MG/DOSE SOPN Inject 1 mg into the skin once a week. 2 pen 5  . dexlansoprazole (DEXILANT) 60 MG capsule Take 60 mg by mouth daily.     No current facility-administered medications on file prior to visit.     No Known Allergies   Objective:  BP 118/79 (BP Location: Right Arm, Patient Position: Sitting, Cuff Size: Normal)   Pulse 87   Temp 97.9 F (36.6 C) (Oral)   Resp 18   Ht 4' 11.41" (1.509 m)   Wt 158 lb 3.2 oz (71.8 kg)   LMP 09/29/2011   SpO2 96%   BMI 31.51 kg/m   Physical Exam  Constitutional: She is oriented to person, place, and time. No distress.  Abdominal: She exhibits no mass. A hernia is present.    Central obesity  Neurological: She is alert and oriented to person, place, and time.  Skin: Skin is warm and dry.  Onychomycosis of 3 nailbeds of left foot.   Psychiatric: Judgment normal.  Vitals reviewed.   Assessment and Plan :  1. Non-recurrent abdominal hernia without obstruction or gangrene, unspecified hernia type - Ambulatory referral to General Surgery - Pt presents with LLQ abdominal pain x 1 day. PE suggests abdominal wall hernia without incarceration or strangulation. Plan to refer to surgery for evaluation.  2. Onychomycosis - Ciclopirox 8 % KIT; Apply 1 application topically daily.  Dispense: 1 each; Refill: 1   Whitney Nazair [place], PA-C  Primary  Care at [place] 02/02/2018 11:24 AM

## 2018-02-02 NOTE — Telephone Encounter (Unsigned)
Copied from [place] [id number]. Topic: Quick Communication - Rx Refill/Question >> Feb 02, 2018  5:30 PM Neva Seat wrote: Ciclopirox 8 % KIT  Pharmacy doesn't have the kit.  Needing to verify that they can change from a kit to the just the Ciclopirox.  [place], [place] - [place] [address] Phone: [phone] Fax: [phone]

## 2018-02-02 NOTE — Patient Instructions (Addendum)
  You will receive a phone call to schedule an appointment with surgery.   Ciclopirox 8% nail lacquer is applied once daily to the affected nail, 5 mm of surrounding skin, and to the nail bed, hyponychium, and undersurface of the nail plate if possible. The nail is wiped clean with alcohol once weekly, and the unattached infected part of the nail is removed periodically. Treatment is continued until nail clearance or up to 48 weeks. Ciclopirox is a well-tolerated treatment. Potential side effects include temporary nail changes and local skin irritation.  Hernia A hernia happens when an organ or tissue inside your body pushes out through a weak spot in the belly (abdomen). Follow these instructions at home:  Avoid stretching or overusing (straining) the muscles near the hernia.  Do not lift anything heavier than 10 lb (4.5 kg).  Use the muscles in your leg when you lift something up. Do not use the muscles in your back.  When you cough, try to cough gently.  Eat a diet that has a lot of fiber. Eat lots of fruits and vegetables.  Drink enough fluids to keep your pee (urine) clear or pale yellow. Try to drink 6-8 glasses of water a day.  Take medicines to make your poop soft (stool softeners) as told by your doctor.  Lose weight, if you are overweight.  Do not use any tobacco products, including cigarettes, chewing tobacco, or electronic cigarettes. If you need help quitting, ask your doctor.  Keep all follow-up visits as told by your doctor. This is important. Contact a doctor if:  The skin by the hernia gets puffy (swollen) or red.  The hernia is painful. Get help right away if:  You have a fever.  You have belly pain that is getting worse.  You feel sick to your stomach (nauseous) or you throw up (vomit).  You cannot push the hernia back in place by gently pressing on it while you are lying down.  The hernia: ? Changes in shape or size. ? Is stuck outside your  belly. ? Changes color. ? Feels hard or tender. This information is not intended to replace advice given to you by your health care provider. Make sure you discuss any questions you have with your health care provider. Document Released: 01/29/2010 Document Revised: 01/17/2016 Document Reviewed: 06/21/2014 Elsevier Interactive Patient Education  [employer].   Thank you for coming in today. I hope you feel we met your needs.  Feel free to call PCP if you have any questions or further requests.  Please consider signing up for MyChart if you do not already have it, as this is a great way to communicate with me.  Best,  Whitney McVey, PA-C  IF you received an x-ray today, you will receive an invoice from [facility]. Please contact [facility] at [phone] with questions or concerns regarding your invoice.   IF you received labwork today, you will receive an invoice from [facility]. Please contact [facility] at [phone] with questions or concerns regarding your invoice.   Our billing staff will not be able to assist you with questions regarding bills from these companies.  You will be contacted with the lab results as soon as they are available. The fastest way to get your results is to activate your My Chart account. Instructions are located on the last page of this paperwork. If you have not heard from [place] regarding the results in 2 weeks, please contact this office.

## 2018-02-03 NOTE — Telephone Encounter (Signed)
Kit is not available at pharmacy- can they change order to just medication?

## 2018-02-05 NOTE — Telephone Encounter (Signed)
OK to change order to medication only.  Thank you!

## 2018-02-05 NOTE — Telephone Encounter (Signed)
Verbal given to change.

## 2018-02-17 ENCOUNTER — Telehealth: Payer: Self-pay | Admitting: *Deleted

## 2018-02-17 NOTE — Telephone Encounter (Signed)
PA APPROVED  

## 2018-03-11 ENCOUNTER — Encounter: Payer: Self-pay | Admitting: General Surgery

## 2018-03-11 DIAGNOSIS — R1032 Left lower quadrant pain: Secondary | ICD-10-CM | POA: Diagnosis not present

## 2018-03-11 DIAGNOSIS — Z794 Long term (current) use of insulin: Secondary | ICD-10-CM | POA: Diagnosis not present

## 2018-03-11 DIAGNOSIS — E119 Type 2 diabetes mellitus without complications: Secondary | ICD-10-CM | POA: Diagnosis not present

## 2018-03-17 ENCOUNTER — Other Ambulatory Visit: Payer: Self-pay | Admitting: General Surgery

## 2018-03-17 DIAGNOSIS — R1032 Left lower quadrant pain: Secondary | ICD-10-CM

## 2018-03-29 ENCOUNTER — Ambulatory Visit
Admission: RE | Admit: 2018-03-29 | Discharge: 2018-03-29 | Disposition: A | Discharge status: Home / Self Care | Payer: 59 | Source: Ambulatory Visit | Attending: General Surgery | Admitting: General Surgery

## 2018-03-29 DIAGNOSIS — N8302 Follicular cyst of left ovary: Secondary | ICD-10-CM | POA: Diagnosis not present

## 2018-03-29 DIAGNOSIS — R1032 Left lower quadrant pain: Secondary | ICD-10-CM

## 2018-03-29 DIAGNOSIS — K5730 Diverticulosis of large intestine without perforation or abscess without bleeding: Secondary | ICD-10-CM | POA: Diagnosis not present

## 2018-03-29 MED ORDER — IOPAMIDOL (ISOVUE-300) INJECTION 61%
75.0000 mL | Freq: Once | INTRAVENOUS | Status: AC | PRN
  Administered 2018-03-29: 75 mL via INTRAVENOUS

## 2018-06-03 DIAGNOSIS — E119 Type 2 diabetes mellitus without complications: Secondary | ICD-10-CM | POA: Diagnosis not present

## 2018-06-03 DIAGNOSIS — H2513 Age-related nuclear cataract, bilateral: Secondary | ICD-10-CM | POA: Diagnosis not present

## 2018-06-03 LAB — HM DIABETES EYE EXAM

## 2018-06-07 DIAGNOSIS — Z90710 Acquired absence of both cervix and uterus: Secondary | ICD-10-CM | POA: Diagnosis not present

## 2018-06-07 DIAGNOSIS — E785 Hyperlipidemia, unspecified: Secondary | ICD-10-CM | POA: Diagnosis not present

## 2018-06-07 DIAGNOSIS — R1032 Left lower quadrant pain: Secondary | ICD-10-CM | POA: Diagnosis not present

## 2018-06-11 ENCOUNTER — Other Ambulatory Visit: Payer: Self-pay | Admitting: Internal Medicine

## 2018-06-11 DIAGNOSIS — E139 Other specified diabetes mellitus without complications: Secondary | ICD-10-CM

## 2018-06-17 ENCOUNTER — Other Ambulatory Visit: Payer: Self-pay

## 2018-06-17 MED ORDER — INSULIN PEN NEEDLE 32G X 4 MM MISC: 3 refills | Status: DC

## 2018-07-26 DIAGNOSIS — N8110 Cystocele, unspecified: Secondary | ICD-10-CM | POA: Diagnosis not present

## 2018-07-30 DIAGNOSIS — N83202 Unspecified ovarian cyst, left side: Secondary | ICD-10-CM | POA: Diagnosis not present

## 2018-07-30 DIAGNOSIS — N9489 Other specified conditions associated with female genital organs and menstrual cycle: Secondary | ICD-10-CM | POA: Diagnosis not present

## 2018-08-11 ENCOUNTER — Encounter: Payer: Self-pay | Admitting: Family Medicine

## 2018-08-11 ENCOUNTER — Other Ambulatory Visit: Payer: Self-pay

## 2018-08-11 ENCOUNTER — Ambulatory Visit: Payer: 59 | Admitting: Family Medicine

## 2018-08-11 VITALS — BP 156/83 | HR 75 | Temp 98.0°F | Resp 14 | Ht 61.0 in | Wt 159.2 lb

## 2018-08-11 DIAGNOSIS — I10 Essential (primary) hypertension: Secondary | ICD-10-CM

## 2018-08-11 DIAGNOSIS — L02224 Furuncle of groin: Secondary | ICD-10-CM

## 2018-08-11 DIAGNOSIS — E785 Hyperlipidemia, unspecified: Secondary | ICD-10-CM

## 2018-08-11 DIAGNOSIS — E1165 Type 2 diabetes mellitus with hyperglycemia: Secondary | ICD-10-CM | POA: Diagnosis not present

## 2018-08-11 DIAGNOSIS — Z794 Long term (current) use of insulin: Secondary | ICD-10-CM | POA: Diagnosis not present

## 2018-08-11 MED ORDER — CEPHALEXIN 500 MG PO CAPS: 500.0000 mg | ORAL_CAPSULE | Freq: Four times a day (QID) | ORAL | 0 refills | Status: DC

## 2018-08-11 MED ORDER — LISINOPRIL 5 MG PO TABS: 5.0000 mg | ORAL_TABLET | Freq: Every day | ORAL | 3 refills | Status: DC

## 2018-08-11 NOTE — Progress Notes (Signed)
12/18/201910:41 AM  Nicole Villanueva July 06, 1966, 52 y.o. female [id number]  Chief Complaint  Patient presents with  . Recurrent Skin Infections    hx of ingrown hair usually go away but this time painful and draining. also need something else for the toenail fungus the last rx for ciclopirox topical solution is not helping any  . Hypertension    had issus before with bp being too low and issue with bp being too high was on lisinopril and was told to stop but for the past 2 wk think bp has been high    HPI:   Patient is a 52 y.o. female with past medical history significant for HTN, GERd, DM2, HLP  who presents today for several concerns  Recently seen be gen surg for eval of hernia repair She shaved her pubic hair and then she got a bump, started hurting a week However yesterday stared to drain Placed neosporin Today better  Does not check BP at home But recently seen when checked BP at other's doctor office has had elevated BP, she has noticed mild headache, mild pressure behind her eyes No chest pain, SOB, edema  Not seeing Endo anymore Stopped ozempic due to severe bone pain  Lab Results  Component Value Date   HGBA1C 10.2 01/04/2018   HGBA1C 10.8 09/23/2017   HGBA1C 10.8 05/21/2017   Lab Results  Component Value Date   MICROALBUR 0.6 12/08/2014   LDLCALC 51 06/22/2015   CREATININE 0.43 (L) 09/22/2017    Fall Risk  08/11/2018 02/02/2018 10/13/2016 12/08/2014 01/06/2014  Falls in the past year? 0 No No No No     Depression screen [facility] 2/9 08/11/2018 02/02/2018 10/13/2016  Decreased Interest 0 0 0  Down, Depressed, Hopeless 0 0 0  PHQ - 2 Score 0 0 0  Altered sleeping - - -  Tired, decreased energy - - -  Change in appetite - - -  Feeling bad or failure about yourself  - - -  Trouble concentrating - - -  Moving slowly or fidgety/restless - - -  Suicidal thoughts - - -  PHQ-9 Score - - -    No Known Allergies  Prior to Admission medications     Medication Sig Start Date End Date Taking? Authorizing Provider  ACCU-CHEK SOFTCLIX LANCETS lancets Use as instructed 3x a day 04/22/16   Philemon Kingdom, MD  atorvastatin (LIPITOR) 40 MG tablet Take 1 tablet (40 mg total) by mouth daily. 06/22/15   Roselee Culver, MD  Ciclopirox 8 % KIT Apply 1 application topically daily. 02/02/18   McVey, Gelene Mink, PA-C  Continuous Blood Gluc Sensor (FREESTYLE LIBRE 14 DAY SENSOR) MISC 1 each by Does not apply route every 14 (fourteen) days. Change every 2 weeks 09/22/17   Philemon Kingdom, MD  dexlansoprazole (DEXILANT) 60 MG capsule Take 60 mg by mouth daily.    [provider]  glipiZIDE (GLUCOTROL) 5 MG tablet TAKE ONE TABLET BY MOUTH BEFORE BREAKFAST AND 2 TABLETS BEFORE DINNER 05/29/17   Philemon Kingdom, MD  glucose blood (ACCU-CHEK COMPACT PLUS) test strip 1 each by Other route as needed for other. Test blood sugar 3 times daily. Dx: E11.65 04/22/16   Philemon Kingdom, MD  Insulin Glargine [facility] KWIKPEN) 100 UNIT/ML SOPN INJECT 55 UNITS SUBCUTANEOUSLY AT BEDTIME 06/14/18   Philemon Kingdom, MD  insulin lispro (HUMALOG KWIKPEN) 100 UNIT/ML KiwkPen Inject 12-18 units into the skin 3 times daily with meals. 01/04/18   Philemon Kingdom, MD  Insulin Pen  Needle (CAREFINE PEN NEEDLES) 32G X 4 MM MISC Use 4x a day 06/17/18   Philemon Kingdom, MD  metFORMIN (GLUCOPHAGE) 1000 MG tablet TAKE 1 TABLET BY MOUTH TWICE DAILY WITH A MEAL 06/14/18   Philemon Kingdom, MD  Plecanatide (TRULANCE) 3 MG TABS Take by mouth.    [provider]  rosuvastatin (CRESTOR) 40 MG tablet Take 1 tablet (40 mg total) by mouth daily. 09/23/17   Philemon Kingdom, MD  Semaglutide (OZEMPIC) 1 MG/DOSE SOPN Inject 1 mg into the skin once a week. 01/04/18   Philemon Kingdom, MD    Past Medical History:  Diagnosis Date  . Diabetes mellitus   . Hyperlipidemia   . Hypertension     Past Surgical History:  Procedure Laterality Date  . ABDOMINAL  HYSTERECTOMY    . CHOLECYSTECTOMY      Social History   Tobacco Use  . Smoking status: Never Smoker  . Smokeless tobacco: Never Used  Substance Use Topics  . Alcohol use: No    Family History  Problem Relation Age of Onset  . Diabetes Mother   . Diabetes Father     ROS Per hpi  OBJECTIVE:  Blood pressure (!) 156/83, pulse 75, temperature 98 F (36.7 C), temperature source Oral, resp. rate 14, height '5\' 1"'  (1.549 m), weight 159 lb 3.2 oz (72.2 kg), last menstrual period 09/29/2011, SpO2 98 %. Body mass index is 30.08 kg/m.   BP Readings from Last 3 Encounters:  08/11/18 (!) 156/83  02/02/18 118/79  01/04/18 102/68    Physical Exam Vitals signs and nursing note reviewed.  Constitutional:      Appearance: She is well-developed.  HENT:     Head: Normocephalic and atraumatic.  Eyes:     General: No scleral icterus.    Conjunctiva/sclera: Conjunctivae normal.     Pupils: Pupils are equal, round, and reactive to light.  Neck:     Musculoskeletal: Neck supple.  Pulmonary:     Effort: Pulmonary effort is normal.  Skin:    General: Skin is warm and dry.     Comments: Right upper mons pubis, with drained boil, no fluctuance, mild induration and erythema  Neurological:     Mental Status: She is alert and oriented to person, place, and time.    ASSESSMENT and PLAN  1. Boil of groin Already drained, mild cellulitis. rx for keflex and RTC precautions given.  2. Essential hypertension Uncontrolled. Starting low dose lisinopril, has h/o orthostatics - CBC  3. Uncontrolled type 2 diabetes mellitus with hyperglycemia, with long-term current use of insulin (HCC) Uncontrolled. Off ozempic due to side effects. Obtain labs today. Make visit to discuss further - Hemoglobin A1c - Comprehensive metabolic panel - TSH - Microalbumin/Creatinine Ratio, Urine  4. Hyperlipidemia, unspecified hyperlipidemia type Checking labs today, medications will be adjusted as needed.  -  Lipid panel  Other orders - lisinopril (PRINIVIL,ZESTRIL) 5 MG tablet; Take 1 tablet (5 mg total) by mouth daily. - cephALEXin (KEFLEX) 500 MG capsule; Take 1 capsule (500 mg total) by mouth 4 (four) times daily.   Return for next available regular appointment.    Rutherford Guys, MD Primary Care at [place], [place] [address] Ph.  [phone] Fax [phone]

## 2018-08-11 NOTE — Patient Instructions (Signed)
° ° ° °  If you have lab work done today you will be contacted with your lab results within the next 2 weeks.  If you have not heard from us then please contact us. The fastest way to get your results is to register for My Chart. ° ° °IF you received an x-ray today, you will receive an invoice from [facility]. Please contact [facility] at [phone] with questions or concerns regarding your invoice.  ° °IF you received labwork today, you will receive an invoice from [facility]. Please contact [facility] at [phone] with questions or concerns regarding your invoice.  ° °Our billing staff will not be able to assist you with questions regarding bills from these companies. ° °You will be contacted with the lab results as soon as they are available. The fastest way to get your results is to activate your My Chart account. Instructions are located on the last page of this paperwork. If you have not heard from us regarding the results in 2 weeks, please contact this office. °  ° ° ° °

## 2018-08-12 LAB — COMPREHENSIVE METABOLIC PANEL
ALT: 27 IU/L (ref 0–32)
AST: 20 IU/L (ref 0–40)
Albumin/Globulin Ratio: 1.6 (ref 1.2–2.2)
Albumin: 4.5 g/dL (ref 3.5–5.5)
Alkaline Phosphatase: 111 IU/L (ref 39–117)
BUN/Creatinine Ratio: 23 (ref 9–23)
BUN: 11 mg/dL (ref 6–24)
Bilirubin Total: 0.3 mg/dL (ref 0.0–1.2)
CO2: 24 mmol/L (ref 20–29)
Calcium: 9.6 mg/dL (ref 8.7–10.2)
Chloride: 99 mmol/L (ref 96–106)
Creatinine, Ser: 0.47 mg/dL — ABNORMAL LOW (ref 0.57–1.00)
GFR calc Af Amer: 132 mL/min/{1.73_m2} (ref >59)
GFR calc non Af Amer: 115 mL/min/{1.73_m2} (ref >59)
Globulin, Total: 2.8 g/dL (ref 1.5–4.5)
Glucose: 190 mg/dL — ABNORMAL HIGH (ref 65–99)
Potassium: 4.5 mmol/L (ref 3.5–5.2)
Sodium: 137 mmol/L (ref 134–144)
Total Protein: 7.3 g/dL (ref 6.0–8.5)

## 2018-08-12 LAB — HEMOGLOBIN A1C
Est. average glucose Bld gHb Est-mCnc: 295 mg/dL
Hgb A1c MFr Bld: 11.9 % — ABNORMAL HIGH (ref 4.8–5.6)

## 2018-08-12 LAB — CBC
Hematocrit: 43.5 % (ref 34.0–46.6)
Hemoglobin: 14.4 g/dL (ref 11.1–15.9)
MCH: 29.2 pg (ref 26.6–33.0)
MCHC: 33.1 g/dL (ref 31.5–35.7)
MCV: 88 fL (ref 79–97)
Platelets: 347 10*3/uL (ref 150–450)
RBC: 4.93 x10E6/uL (ref 3.77–5.28)
RDW: 12 % — ABNORMAL LOW (ref 12.3–15.4)
WBC: 8.2 10*3/uL (ref 3.4–10.8)

## 2018-08-12 LAB — LIPID PANEL
Chol/HDL Ratio: 7.4 ratio — ABNORMAL HIGH (ref 0.0–4.4)
Cholesterol, Total: 267 mg/dL — ABNORMAL HIGH (ref 100–199)
HDL: 36 mg/dL — ABNORMAL LOW (ref >39)
LDL Calculated: 156 mg/dL — ABNORMAL HIGH (ref 0–99)
Triglycerides: 375 mg/dL — ABNORMAL HIGH (ref 0–149)
VLDL Cholesterol Cal: 75 mg/dL — ABNORMAL HIGH (ref 5–40)

## 2018-08-12 LAB — MICROALBUMIN / CREATININE URINE RATIO
Creatinine, Urine: 40.1 mg/dL
Microalb/Creat Ratio: 14 mg/g creat (ref 0.0–30.0)
Microalbumin, Urine: 5.6 ug/mL

## 2018-08-12 LAB — TSH: TSH: 1.42 u[IU]/mL (ref 0.450–4.500)

## 2018-08-16 ENCOUNTER — Telehealth: Payer: Self-pay | Admitting: Family Medicine

## 2018-09-13 DIAGNOSIS — M25511 Pain in right shoulder: Secondary | ICD-10-CM | POA: Diagnosis not present

## 2018-09-20 DIAGNOSIS — M25611 Stiffness of right shoulder, not elsewhere classified: Secondary | ICD-10-CM | POA: Diagnosis not present

## 2018-09-20 DIAGNOSIS — M7501 Adhesive capsulitis of right shoulder: Secondary | ICD-10-CM | POA: Diagnosis not present

## 2018-09-20 DIAGNOSIS — M6281 Muscle weakness (generalized): Secondary | ICD-10-CM | POA: Diagnosis not present

## 2018-09-22 DIAGNOSIS — M7501 Adhesive capsulitis of right shoulder: Secondary | ICD-10-CM | POA: Diagnosis not present

## 2018-09-22 DIAGNOSIS — M6281 Muscle weakness (generalized): Secondary | ICD-10-CM | POA: Diagnosis not present

## 2018-09-22 DIAGNOSIS — M25611 Stiffness of right shoulder, not elsewhere classified: Secondary | ICD-10-CM | POA: Diagnosis not present

## 2018-09-27 ENCOUNTER — Encounter: Payer: Self-pay | Admitting: Family Medicine

## 2018-09-27 ENCOUNTER — Ambulatory Visit: Payer: 59 | Admitting: Family Medicine

## 2018-09-27 ENCOUNTER — Other Ambulatory Visit: Payer: Self-pay

## 2018-09-27 VITALS — BP 116/67 | HR 74 | Temp 98.1°F | Wt 156.0 lb

## 2018-09-27 DIAGNOSIS — E785 Hyperlipidemia, unspecified: Secondary | ICD-10-CM

## 2018-09-27 DIAGNOSIS — B351 Tinea unguium: Secondary | ICD-10-CM

## 2018-09-27 DIAGNOSIS — I10 Essential (primary) hypertension: Secondary | ICD-10-CM

## 2018-09-27 DIAGNOSIS — Z1211 Encounter for screening for malignant neoplasm of colon: Secondary | ICD-10-CM | POA: Diagnosis not present

## 2018-09-27 DIAGNOSIS — Z794 Long term (current) use of insulin: Secondary | ICD-10-CM

## 2018-09-27 DIAGNOSIS — E1165 Type 2 diabetes mellitus with hyperglycemia: Secondary | ICD-10-CM | POA: Diagnosis not present

## 2018-09-27 DIAGNOSIS — Z1239 Encounter for other screening for malignant neoplasm of breast: Secondary | ICD-10-CM

## 2018-09-27 MED ORDER — BASAGLAR KWIKPEN 100 UNIT/ML ~~LOC~~ SOPN: 60.0000 [IU] | PEN_INJECTOR | Freq: Every day | SUBCUTANEOUS | 11 refills | Status: DC

## 2018-09-27 MED ORDER — INSULIN PEN NEEDLE 32G X 4 MM MISC: 3 refills | Status: DC

## 2018-09-27 MED ORDER — BLOOD GLUCOSE METER KIT: PACK | 11 refills | Status: AC

## 2018-09-27 MED ORDER — LISINOPRIL 5 MG PO TABS: 5.0000 mg | ORAL_TABLET | Freq: Every day | ORAL | 3 refills | Status: DC

## 2018-09-27 MED ORDER — ATORVASTATIN CALCIUM 10 MG PO TABS: 10.0000 mg | ORAL_TABLET | Freq: Every day | ORAL | 3 refills | Status: DC

## 2018-09-27 MED ORDER — INSULIN LISPRO (1 UNIT DIAL) 100 UNIT/ML (KWIKPEN): 10.0000 [IU] | PEN_INJECTOR | Freq: Three times a day (TID) | SUBCUTANEOUS | 11 refills | Status: DC

## 2018-09-27 MED ORDER — CICLOPIROX 8 % EX KIT: 1.0000 "application " | PACK | Freq: Every day | CUTANEOUS | 5 refills | Status: DC

## 2018-09-27 MED ORDER — METFORMIN HCL 1000 MG PO TABS: ORAL_TABLET | ORAL | 11 refills | Status: DC

## 2018-09-27 NOTE — Progress Notes (Signed)
2/3/20208:19 AM  Nicole Villanueva [birth year], 53 y.o. female [id number]  Chief Complaint  Patient presents with  . Diabetes    no medication refills needed at this time  . Hypertension    HPI:   Patient is a 53 y.o. female with past medical history significant for HTN, GERd, DM2, HLP  who presents today for routine followup  Was seeing Dr Cruzita Lederer, endo, last OV June 2019 On metformin  basaglar 56 unidades Uses humalog only when cbgs > 300, uses 8-12 units Checks cbgs at least twice a day, fasting and at bedtime She will use humalog at bedtime if cbg > 300 Fasting 160-180 Stopped using crestor due to feeling her head was heavy Reports issues with atorvastatin in the past, does not recall specifics Sees Dr Katy Fitch yearly, Oct 2019 last OV, per patent no retinopathy Has seen nutritionist in the past, remote Struggles with motivation, thinks there might be mild depression Has never seen a counselor  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 0 0 No No No  Number falls in past yr: 0 - - - -  Injury with Fall? 1 - - - -     Depression screen [facility] 2/9 09/27/2018 08/11/2018 02/02/2018  Decreased Interest 0 0 0  Down, Depressed, Hopeless 0 0 0  PHQ - 2 Score 0 0 0  Altered sleeping - - -  Tired, decreased energy - - -  Change in appetite - - -  Feeling bad or failure about yourself  - - -  Trouble concentrating - - -  Moving slowly or fidgety/restless - - -  Suicidal thoughts - - -  PHQ-9 Score - - -    No Known Allergies  Prior to Admission medications   Medication  Sig Start Date End Date Taking? Authorizing Provider  ACCU-CHEK SOFTCLIX LANCETS lancets Use as instructed 3x a day 04/22/16  Yes Philemon Kingdom, MD  atorvastatin (LIPITOR) 40 MG tablet Take 1 tablet (40 mg total) by mouth daily. 06/22/15  Yes Roselee Culver, MD  cephALEXin (KEFLEX) 500 MG capsule Take 1 capsule (500 mg total) by mouth 4 (four) times daily. 08/11/18  Yes Rutherford Guys, MD  Ciclopirox 8 % KIT Apply 1 application topically daily. 02/02/18  Yes McVey, Gelene Mink, PA-C  Continuous Blood Gluc Sensor (FREESTYLE LIBRE 14 DAY SENSOR) MISC 1 each by Does not apply route every 14 (fourteen) days. Change every 2 weeks 09/22/17  Yes Philemon Kingdom, MD  dexlansoprazole (DEXILANT) 60 MG capsule Take 60 mg by mouth daily.   Yes [provider]  glipiZIDE (GLUCOTROL) 5 MG tablet TAKE ONE TABLET BY MOUTH BEFORE BREAKFAST AND 2 TABLETS BEFORE DINNER 05/29/17  Yes Philemon Kingdom, MD  glucose blood (ACCU-CHEK COMPACT PLUS) test strip 1 each by Other route as needed for other. Test blood sugar 3 times daily. Dx: E11.65 04/22/16  Yes Philemon Kingdom, MD  Insulin Glargine [facility] KWIKPEN) 100 UNIT/ML SOPN INJECT 55 UNITS SUBCUTANEOUSLY AT BEDTIME 06/14/18  Yes Philemon Kingdom, MD  insulin lispro (HUMALOG KWIKPEN) 100  UNIT/ML KiwkPen Inject 12-18 units into the skin 3 times daily with meals. 01/04/18  Yes Philemon Kingdom, MD  Insulin Pen Needle (CAREFINE PEN NEEDLES) 32G X 4 MM MISC Use 4x a day 06/17/18  Yes Philemon Kingdom, MD  lisinopril (PRINIVIL,ZESTRIL) 5 MG tablet Take 1 tablet (5 mg total) by mouth daily. 08/11/18  Yes Rutherford Guys, MD  metFORMIN (GLUCOPHAGE) 1000 MG tablet TAKE 1 TABLET BY MOUTH TWICE DAILY WITH A MEAL 06/14/18  Yes Philemon Kingdom, MD  Plecanatide (TRULANCE) 3 MG TABS Take by mouth.   Yes [provider]  rosuvastatin (CRESTOR) 40 MG tablet Take 1 tablet (40 mg total) by mouth daily. 09/23/17  Yes Philemon Kingdom, MD    Semaglutide [facility]) 1 MG/DOSE SOPN Inject 1 mg into the skin once a week. 01/04/18  Yes Philemon Kingdom, MD    Past Medical History:  Diagnosis Date  . Diabetes mellitus   . Hyperlipidemia   . Hypertension     Past Surgical History:  Procedure Laterality Date  . ABDOMINAL HYSTERECTOMY    . CHOLECYSTECTOMY      Social History   Tobacco Use  . Smoking status: Never Smoker  . Smokeless tobacco: Never Used  Substance Use Topics  . Alcohol use: No    Family History  Problem Relation Age of Onset  . Diabetes Mother   . Diabetes Father     ROS Per hpi  OBJECTIVE:  Blood pressure 116/67, pulse 74, temperature 98.1 F (36.7 C), temperature source Oral, weight 156 lb (70.8 kg), last menstrual period 09/28/2016, SpO2 96 %. Body mass index is 29.48 kg/m.   Physical Exam Vitals signs and nursing note reviewed.  Constitutional:      Appearance: She is well-developed.  HENT:     Head: Normocephalic and atraumatic.  Eyes:     General: No scleral icterus.    Conjunctiva/sclera: Conjunctivae normal.     Pupils: Pupils are equal, round, and reactive to light.  Neck:     Musculoskeletal: Neck supple.  Pulmonary:     Effort: Pulmonary effort is normal.  Skin:    General: Skin is warm and dry.  Neurological:     Mental Status: She is alert and oriented to person, place, and time.     Diabetic Foot Exam - Simple   Simple Foot Form Visual Inspection No deformities, no ulcerations, no other skin breakdown bilaterally:  Yes Sensation Testing Intact to touch and monofilament testing bilaterally:  Yes Pulse Check Comments Felt all pricks with filament     ASSESSMENT and PLAN  1. Uncontrolled type 2 diabetes mellitus with hyperglycemia, with long-term current use of insulin (HCC) Uncontrolled, non compliance on meds and LFM. Discussed consequences of untreated DM. Discussed proper use of humalog. Discussed use of fixed dosing as not checking between meals.  Discussed counseling to aide with motivation and compliance. Discussed increasing basaglar until fasting at goal. Discussed ssx and mgt of hypoglycemia - HM DIABETES FOOT EXAM - metFORMIN (GLUCOPHAGE) 1000 MG tablet; TAKE 1 TABLET BY MOUTH TWICE DAILY WITH A MEAL  2. Essential hypertension Controlled. Continue current regime.   3. Hyperlipidemia, unspecified hyperlipidemia type Not at goal, starting low dose atorvastatin, discussed r/se/b, discussed qod if needed  4. Screening for colon cancer - Cologuard  5. Screening for breast cancer - MM Digital Screening; Future  6. Onychomycosis - Ciclopirox 8 % KIT; Apply 1 application topically daily.  Other orders - blood glucose meter kit and supplies; Per insurance preference. Test  blood glucose three times daily as directed. Dx E11.65, Z79.4 - atorvastatin (LIPITOR) 10 MG tablet; Take 1 tablet (10 mg total) by mouth daily. - Insulin Glargine (BASAGLAR KWIKPEN) 100 UNIT/ML SOPN; Inject 0.6 mLs (60 Units total) into the skin at bedtime. - insulin lispro (HUMALOG KWIKPEN) 100 UNIT/ML KwikPen; Inject 0.1 mLs (10 Units total) into the skin 3 (three) times daily. - Insulin Pen Needle (CAREFINE PEN NEEDLES) 32G X 4 MM MISC; Use 4x a day - lisinopril (PRINIVIL,ZESTRIL) 5 MG tablet; Take 1 tablet (5 mg total) by mouth daily.   Return in about 6 weeks (around 11/08/2018).    Rutherford Guys, MD Primary Care at [place], [place] [address] Ph.  [phone] Fax [phone]

## 2018-09-27 NOTE — Patient Instructions (Addendum)
   If you have lab work done today you will be contacted with your lab results within the next 2 weeks.  If you have not heard from us then please contact us. The fastest way to get your results is to register for My Chart.   IF you received an x-ray today, you will receive an invoice from [facility]. Please contact [facility] at [phone] with questions or concerns regarding your invoice.   IF you received labwork today, you will receive an invoice from [facility]. Please contact [facility] at [phone] with questions or concerns regarding your invoice.   Our billing staff will not be able to assist you with questions regarding bills from these companies.  You will be contacted with the lab results as soon as they are available. The fastest way to get your results is to activate your My Chart account. Instructions are located on the last page of this paperwork. If you have not heard from us regarding the results in 2 weeks, please contact this office.     Diabetes mellitus y nutricin, en adultos Diabetes Mellitus and Nutrition, Adult Si sufre de diabetes (diabetes mellitus), es muy importante tener hbitos alimenticios saludables debido a que sus niveles de azcar en la sangre (glucosa) se ven afectados en gran medida por lo que come y bebe. Comer alimentos saludables en las cantidades adecuadas, aproximadamente a la misma hora todos los das, lo ayudar a:  Controlar la glucemia.  Disminuir el riesgo de sufrir una enfermedad cardaca.  Mejorar la presin arterial.  Alcanzar o mantener un peso saludable. Todas las personas que sufren de diabetes son diferentes y cada una tiene necesidades diferentes en cuanto a un plan de alimentacin. El mdico puede recomendarle que trabaje con un especialista en dietas y nutricin (nutricionista) para elaborar el mejor plan para usted. Su plan de alimentacin puede variar segn factores como:  Las caloras que  necesita.  Los medicamentos que toma.  Su peso.  Sus niveles de glucemia, presin arterial y colesterol.  Su nivel de actividad.  Otras afecciones que tenga, como enfermedades cardacas o renales. Cmo me afectan los carbohidratos? Los carbohidratos, o hidratos de carbono, afectan su nivel de glucemia ms que cualquier otro tipo de alimento. La ingesta de carbohidratos naturalmente aumenta la cantidad de glucosa en la sangre. El recuento de carbohidratos es un mtodo destinado a llevar un registro de la cantidad de carbohidratos que se consumen. El recuento de carbohidratos es importante para mantener la glucemia a un nivel saludable, especialmente si utiliza insulina o toma determinados medicamentos por va oral para la diabetes. Es importante conocer la cantidad de carbohidratos que se pueden ingerir en cada comida sin correr ningn riesgo. Esto es diferente en cada persona. Su nutricionista puede ayudarlo a calcular la cantidad de carbohidratos que debe ingerir en cada comida y en cada refrigerio. Entre los alimentos que contienen carbohidratos, se incluyen:  Pan, cereal, arroz, pastas y galletas.  Papas y maz.  Guisantes, frijoles y lentejas.  Leche y yogur.  Frutas y jugo.  Postres, como pasteles, galletas, helado y caramelos. Cmo me afecta el alcohol? El alcohol puede provocar disminuciones sbitas de la glucemia (hipoglucemia), especialmente si utiliza insulina o toma determinados medicamentos por va oral para la diabetes. La hipoglucemia es una afeccin potencialmente mortal. Los sntomas de la hipoglucemia (somnolencia, mareos y confusin) son similares a los sntomas de haber consumido demasiado alcohol. Si el mdico afirma que el alcohol es seguro para usted, siga estas pautas:    Limite el consumo de alcohol a no ms de 1medida por da si es mujer y no est embarazada, y a 2medidas si es hombre. Una medida equivale a 12oz (355ml) de cerveza, 5oz (148ml) de vino o  1oz (44ml) de bebidas alcohlicas de alta graduacin.  No beba con el estmago vaco.  Mantngase hidratado bebiendo agua, refrescos dietticos o t helado sin azcar.  Tenga en cuenta que los refrescos comunes, los jugos y otras bebida para mezclar pueden contener mucha azcar y se deben contar como carbohidratos. Cules son algunos consejos para seguir este plan?  Leer las etiquetas de los alimentos  Comience por leer el tamao de la porcin en la "Informacin nutricional" en las etiquetas de los alimentos envasados y las bebidas. La cantidad de caloras, carbohidratos, grasas y otros nutrientes mencionados en la etiqueta se basan en una porcin del alimento. Muchos alimentos contienen ms de una porcin por envase.  Verifique la cantidad total de gramos (g) de carbohidratos totales en una porcin. Puede calcular la cantidad de porciones de carbohidratos al dividir el total de carbohidratos por 15. Por ejemplo, si un alimento tiene un total de 30g de carbohidratos, equivale a 2 porciones de carbohidratos.  Verifique la cantidad de gramos (g) de grasas saturadas y grasas trans en una porcin. Escoja alimentos que no contengan grasa o que tengan un bajo contenido.  Verifique la cantidad de miligramos (mg) de sal (sodio) en una porcin. La mayora de las personas deben limitar la ingesta de sodio total a menos de 2300mg por da.  Siempre consulte la informacin nutricional de los alimentos etiquetados como "con bajo contenido de grasa" o "sin grasa". Estos alimentos pueden tener un mayor contenido de azcar agregada o carbohidratos refinados, y deben evitarse.  Hable con su nutricionista para identificar sus objetivos diarios en cuanto a los nutrientes mencionados en la etiqueta. Al ir de compras  Evite comprar alimentos procesados, enlatados o precocinados. Estos alimentos tienden a tener una mayor cantidad de grasa, sodio y azcar agregada.  Compre en la zona exterior de la tienda de  comestibles. Esta zona incluye frutas y verduras frescas, granos a granel, carnes frescas y productos lcteos frescos. Al cocinar  Utilice mtodos de coccin a baja temperatura, como hornear, en lugar de mtodos de coccin a alta temperatura, como frer en abundante aceite.  Cocine con aceites saludables, como el aceite de oliva, canola o girasol.  Evite cocinar con manteca, crema o carnes con alto contenido de grasa. Planificacin de las comidas  Coma las comidas y los refrigerios regularmente, preferentemente a la misma hora todos los das. Evite pasar largos perodos de tiempo sin comer.  Consuma alimentos ricos en fibra, como frutas frescas, verduras, frijoles y cereales integrales. Consulte a su nutricionista sobre cuntas porciones de carbohidratos puede consumir en cada comida.  Consuma entre 4 y 6 onzas (oz) de protenas magras por da, como carnes magras, pollo, pescado, huevos o tofu. Una onza de protena magra equivale a: ? 1 onza de carne, pollo o pescado. ? 1huevo. ?  taza de tofu.  Coma algunos alimentos por da que contengan grasas saludables, como aguacates, frutos secos, semillas y pescado. Estilo de vida  Controle su nivel de glucemia con regularidad.  Haga actividad fsica habitualmente como se lo haya indicado el mdico. Esto puede incluir lo siguiente: ? 150minutos semanales de ejercicio de intensidad moderada o alta. Esto podra incluir caminatas dinmicas, ciclismo o gimnasia acutica. ? Realizar ejercicios de elongacin y de fortalecimiento, como yoga o levantamiento   de pesas, por lo menos 2veces por semana.  Tome los medicamentos como se lo haya indicado el mdico.  No consuma ningn producto que contenga nicotina o tabaco, como cigarrillos y cigarrillos electrnicos. Si necesita ayuda para dejar de fumar, consulte al mdico.  Trabaje con un asesor o instructor en diabetes para identificar estrategias para controlar el estrs y cualquier desafo emocional  y social. Preguntas para hacerle al mdico  Es necesario que consulte a un instructor en el cuidado de la diabetes?  Es necesario que me rena con un nutricionista?  A qu nmero puedo llamar si tengo preguntas?  Cules son los mejores momentos para controlar la glucemia? Dnde encontrar ms informacin:  Asociacin Estadounidense de la Diabetes (American Diabetes Association): diabetes.org  Academia de Nutricin y Diettica (Academy of Nutrition and Dietetics): www.eatright.org  Instituto Nacional de la Diabetes y las Enfermedades Digestivas y Renales (National Institute of Diabetes and Digestive and Kidney Diseases, NIH): www.niddk.nih.gov Resumen  Un plan de alimentacin saludable lo ayudar a controlar la glucemia y mantener un estilo de vida saludable.  Trabajar con un especialista en dietas y nutricin (nutricionista) puede ayudarlo a elaborar el mejor plan de alimentacin para usted.  Tenga en cuenta que los carbohidratos (hidratos de carbono) y el alcohol tienen efectos inmediatos en sus niveles de glucemia. Es importante contar los carbohidratos que ingiere y consumir alcohol con prudencia. Esta informacin no tiene como fin reemplazar el consejo del mdico. Asegrese de hacerle al mdico cualquier pregunta que tenga. Document Released: 11/18/2007 Document Revised: 04/21/2017 Document Reviewed: 12/01/2016 Elsevier Interactive Patient Education  2019 Elsevier Inc.  

## 2018-09-29 DIAGNOSIS — M25611 Stiffness of right shoulder, not elsewhere classified: Secondary | ICD-10-CM | POA: Diagnosis not present

## 2018-09-29 DIAGNOSIS — M7501 Adhesive capsulitis of right shoulder: Secondary | ICD-10-CM | POA: Diagnosis not present

## 2018-09-29 DIAGNOSIS — M6281 Muscle weakness (generalized): Secondary | ICD-10-CM | POA: Diagnosis not present

## 2018-10-01 DIAGNOSIS — M25611 Stiffness of right shoulder, not elsewhere classified: Secondary | ICD-10-CM | POA: Diagnosis not present

## 2018-10-01 DIAGNOSIS — M6281 Muscle weakness (generalized): Secondary | ICD-10-CM | POA: Diagnosis not present

## 2018-10-01 DIAGNOSIS — M7501 Adhesive capsulitis of right shoulder: Secondary | ICD-10-CM | POA: Diagnosis not present

## 2018-10-06 DIAGNOSIS — M6281 Muscle weakness (generalized): Secondary | ICD-10-CM | POA: Diagnosis not present

## 2018-10-06 DIAGNOSIS — M25611 Stiffness of right shoulder, not elsewhere classified: Secondary | ICD-10-CM | POA: Diagnosis not present

## 2018-10-06 DIAGNOSIS — M7501 Adhesive capsulitis of right shoulder: Secondary | ICD-10-CM | POA: Diagnosis not present

## 2018-10-08 DIAGNOSIS — M25611 Stiffness of right shoulder, not elsewhere classified: Secondary | ICD-10-CM | POA: Diagnosis not present

## 2018-10-08 DIAGNOSIS — M7501 Adhesive capsulitis of right shoulder: Secondary | ICD-10-CM | POA: Diagnosis not present

## 2018-10-08 DIAGNOSIS — M6281 Muscle weakness (generalized): Secondary | ICD-10-CM | POA: Diagnosis not present

## 2018-11-10 ENCOUNTER — Encounter: Payer: Self-pay | Admitting: Family Medicine

## 2018-11-10 ENCOUNTER — Ambulatory Visit: Payer: 59 | Admitting: Family Medicine

## 2018-11-10 ENCOUNTER — Other Ambulatory Visit: Payer: Self-pay

## 2018-11-10 VITALS — BP 128/60 | HR 67 | Temp 97.9°F | Ht 61.0 in | Wt 159.6 lb

## 2018-11-10 DIAGNOSIS — Z794 Long term (current) use of insulin: Secondary | ICD-10-CM

## 2018-11-10 DIAGNOSIS — I10 Essential (primary) hypertension: Secondary | ICD-10-CM | POA: Diagnosis not present

## 2018-11-10 DIAGNOSIS — E785 Hyperlipidemia, unspecified: Secondary | ICD-10-CM

## 2018-11-10 DIAGNOSIS — E1165 Type 2 diabetes mellitus with hyperglycemia: Secondary | ICD-10-CM

## 2018-11-10 LAB — POCT GLYCOSYLATED HEMOGLOBIN (HGB A1C): Hemoglobin A1C: 11.1 % — AB (ref 4.0–5.6)

## 2018-11-10 MED ORDER — ATORVASTATIN CALCIUM 10 MG PO TABS: 10.0000 mg | ORAL_TABLET | Freq: Every day | ORAL | 3 refills | Status: DC

## 2018-11-10 MED ORDER — DULAGLUTIDE 0.75 MG/0.5ML ~~LOC~~ SOAJ: 0.7500 mg | SUBCUTANEOUS | 5 refills | Status: DC

## 2018-11-10 MED ORDER — ATORVASTATIN CALCIUM 40 MG PO TABS: 40.0000 mg | ORAL_TABLET | Freq: Every day | ORAL | 3 refills | Status: DC

## 2018-11-10 NOTE — Progress Notes (Signed)
3/18/20209:06 AM  Nicole Villanueva Oct 02, 1965, 53 y.o. female [id number]  Chief Complaint  Patient presents with   Diabetes    has the cologuard at home already, will complete soon    HPI:   Patient is a 53 y.o. female with past medical history significant for HTN, GERD, DM2, HLPwho presents today for routine followup  Last OV feb 2020 DM2 dx 2008 Uncontrolled DM - basaglar 60 units, humalog 10u AC Checks cbgs once or twice a day > 200s Denies any lows Not having breakfast, using humalog with lunch and with dinner Had steroid injection into right shoulder in feb In 2017 under endo doing well on tanzeum but stopped due to insurance, changed to ozempic but she did not tolerate Also on invokana - stopped higher dose due to yeast infections and leg swelling Trying to eat healthier, more veggies Tolerating atorvastatin 4m wo issues, 469mcaused nausea Started on humalog in sept 2018  Lab Results  Component Value Date   HGBA1C 11.9 (H) 08/11/2018   HGBA1C 10.2 01/04/2018   HGBA1C 10.8 09/23/2017   Lab Results  Component Value Date   MICROALBUR 0.6 12/08/2014   [place] 156 (H) 08/11/2018   CREATININE 0.47 (L) 08/11/2018    Fall Risk  11/10/2018 09/27/2018 08/11/2018 02/02/2018 10/13/2016  Falls in the past year? 0 0 0 No No  Number falls in past yr: 0 0 - - -  Injury with Fall? 0 1 - - -     [facility] 11/10/2018 09/27/2018 08/11/2018  Decreased Interest 0 0 0  Down, Depressed, Hopeless 0 0 0  PHQ - 2 Score 0 0 0  Altered sleeping - - -  Tired, decreased energy - - -  Change in appetite - - -  Feeling bad or failure about yourself  - - -  Trouble concentrating - - -  Moving slowly or fidgety/restless - - -  Suicidal thoughts - - -  PHQ-9 Score - - -    No Known Allergies  Prior to Admission medications   Medication Sig Start Date End Date Taking? Authorizing Provider  atorvastatin (LIPITOR) 10 MG tablet Take 1 tablet (10 mg total) by  mouth daily. 09/27/18  Yes SaRutherford GuysMD  blood glucose meter kit and supplies Per insurance preference. Test blood glucose three times daily as directed. Dx E11.65, Z79.4 09/27/18  Yes SaRutherford GuysMD  Ciclopirox 8 % KIT Apply 1 application topically daily. 09/27/18  Yes SaRutherford GuysMD  Insulin Glargine (BASAGLAR KWIKPEN) 100 UNIT/ML SOPN Inject 0.6 mLs (60 Units total) into the skin at bedtime. 09/27/18  Yes SaRutherford GuysMD  insulin lispro (HUMALOG KWIKPEN) 100 UNIT/ML KwikPen Inject 0.1 mLs (10 Units total) into the skin 3 (three) times daily. 09/27/18  Yes SaRutherford GuysMD  Insulin Pen Needle (CAREFINE PEN NEEDLES) 32G X 4 MM MISC Use 4x a day 09/27/18  Yes SaRutherford GuysMD  lisinopril (PRINIVIL,ZESTRIL) 5 MG tablet Take 1 tablet (5 mg total) by mouth daily. 09/27/18  Yes SaRutherford GuysMD  metFORMIN (GLUCOPHAGE) 1000 MG tablet TAKE 1 TABLET BY MOUTH TWICE DAILY WITH A MEAL 09/27/18  Yes SaRutherford GuysMD  ciclopirox ([facility] APPLY 1 APPLICATION TOPICALLY DAILY 10/27/18   [provider]    Past Medical History:  Diagnosis Date   Diabetes mellitus    Hyperlipidemia    Hypertension     Past Surgical History:  Procedure  Laterality Date   ABDOMINAL HYSTERECTOMY     CHOLECYSTECTOMY      Social History   Tobacco Use   Smoking status: Never Smoker   Smokeless tobacco: Never Used  Substance Use Topics   Alcohol use: No    Family History  Problem Relation Age of Onset   Diabetes Mother    Diabetes Father     Review of Systems  Constitutional: Negative for chills and fever.  Respiratory: Negative for cough and shortness of breath.   Cardiovascular: Negative for chest pain, palpitations and leg swelling.  Gastrointestinal: Negative for abdominal pain, nausea and vomiting.     OBJECTIVE:  Blood pressure 128/60, pulse 67, temperature 97.9 F (36.6 C), height _0  (1.549 m), weight 159 lb 9.6 oz (72.4 kg), last menstrual  period 09/28/2016, SpO2 95 %. Body mass index is 30.16 kg/m.   Wt Readings from Last 3 Encounters:  11/10/18 159 lb 9.6 oz (72.4 kg)  09/27/18 156 lb (70.8 kg)  08/11/18 159 lb 3.2 oz (72.2 kg)     Physical Exam Vitals signs and nursing note reviewed.  Constitutional:      Appearance: She is well-developed.  HENT:     Head: Normocephalic and atraumatic.  Eyes:     General: No scleral icterus.    Conjunctiva/sclera: Conjunctivae normal.     Pupils: Pupils are equal, round, and reactive to light.  Neck:     Musculoskeletal: Neck supple.  Pulmonary:     Effort: Pulmonary effort is normal.  Skin:    General: Skin is warm and dry.  Neurological:     Mental Status: She is alert and oriented to person, place, and time.     Results for orders placed or performed in visit on 11/10/18 (from the past 24 hour(s))  POCT glycosylated hemoglobin (Hb A1C)     Status: Abnormal   Collection Time: 11/10/18  9:34 AM  Result Value Ref Range   Hemoglobin A1C 11.1 (A) 4.0 - 5.6 %   HbA1c POC (<> result, manual entry)     HbA1c, POC (prediabetic range)     HbA1c, POC (controlled diabetic range)      ASSESSMENT and PLAN  1. Uncontrolled type 2 diabetes mellitus with hyperglycemia, with long-term current use of insulin (HCC) Uncontrolled. Adding back GLP1., low dose, reviewed r/se/b Discussed might need to increase basaglar as fasting still not at goal. Discussed diet. Consider referring back to endo - POCT glycosylated hemoglobin (Hb A1C)  2. Essential hypertension Controlled. Continue current regime.   3. Hyperlipidemia, unspecified hyperlipidemia type Tolerating low dose atorvastatin. checking labs today  Other orders     Return in about 6 weeks (around 12/22/2018) for Diabetes.    Rutherford Guys, MD Primary Care at [place], [place] [address] Ph.  [phone] Fax [phone]

## 2018-11-10 NOTE — Patient Instructions (Addendum)
  FAVOR DE TRAER LA MAQUINA DEL AZUCAR PARA LA PROXIMA CITA   If you have lab work done today you will be contacted with your lab results within the next 2 weeks.  If you have not heard from [place] then please contact us. The fastest way to get your results is to register for My Chart.   IF you received an x-ray today, you will receive an invoice from [facility]. Please contact [facility] at [phone] with questions or concerns regarding your invoice.   IF you received labwork today, you will receive an invoice from [facility]. Please contact [facility] at [phone] with questions or concerns regarding your invoice.   Our billing staff will not be able to assist you with questions regarding bills from these companies.  You will be contacted with the lab results as soon as they are available. The fastest way to get your results is to activate your My Chart account. Instructions are located on the last page of this paperwork. If you have not heard from [place] regarding the results in 2 weeks, please contact this office.

## 2018-11-11 LAB — CMP14+EGFR
ALT: 22 IU/L (ref 0–32)
AST: 21 IU/L (ref 0–40)
Albumin/Globulin Ratio: 1.6 (ref 1.2–2.2)
Albumin: 4.4 g/dL (ref 3.8–4.9)
Alkaline Phosphatase: 90 IU/L (ref 39–117)
BUN/Creatinine Ratio: 24 — ABNORMAL HIGH (ref 9–23)
BUN: 10 mg/dL (ref 6–24)
Bilirubin Total: 0.3 mg/dL (ref 0.0–1.2)
CO2: 23 mmol/L (ref 20–29)
Calcium: 9.7 mg/dL (ref 8.7–10.2)
Chloride: 101 mmol/L (ref 96–106)
Creatinine, Ser: 0.42 mg/dL — ABNORMAL LOW (ref 0.57–1.00)
GFR calc Af Amer: 137 mL/min/{1.73_m2} (ref >59)
GFR calc non Af Amer: 119 mL/min/{1.73_m2} (ref >59)
Globulin, Total: 2.7 g/dL (ref 1.5–4.5)
Glucose: 165 mg/dL — ABNORMAL HIGH (ref 65–99)
Potassium: 4.4 mmol/L (ref 3.5–5.2)
Sodium: 141 mmol/L (ref 134–144)
Total Protein: 7.1 g/dL (ref 6.0–8.5)

## 2018-11-11 LAB — LIPID PANEL
Chol/HDL Ratio: 4.1 ratio (ref 0.0–4.4)
Cholesterol, Total: 168 mg/dL (ref 100–199)
HDL: 41 mg/dL (ref >39)
LDL Calculated: 91 mg/dL (ref 0–99)
Triglycerides: 181 mg/dL — ABNORMAL HIGH (ref 0–149)
VLDL Cholesterol Cal: 36 mg/dL (ref 5–40)

## 2018-12-24 ENCOUNTER — Telehealth (INDEPENDENT_AMBULATORY_CARE_PROVIDER_SITE_OTHER): Payer: 59 | Admitting: Family Medicine

## 2018-12-24 ENCOUNTER — Other Ambulatory Visit: Payer: Self-pay

## 2018-12-24 DIAGNOSIS — E1165 Type 2 diabetes mellitus with hyperglycemia: Secondary | ICD-10-CM

## 2018-12-24 DIAGNOSIS — Z794 Long term (current) use of insulin: Secondary | ICD-10-CM | POA: Diagnosis not present

## 2018-12-24 MED ORDER — BASAGLAR KWIKPEN 100 UNIT/ML ~~LOC~~ SOPN: 80.0000 [IU] | PEN_INJECTOR | Freq: Every day | SUBCUTANEOUS | 5 refills | Status: DC

## 2018-12-24 NOTE — Progress Notes (Signed)
Follow up with diabetes. Chks her cbg's daily, am numbers are 200-250 pm numbers are 300's. Says she needs no refills at this time

## 2018-12-24 NOTE — Progress Notes (Signed)
Virtual Visit Note  I connected with patient on 12/24/18 at 250pm by phone and verified that I am speaking with the correct person using two identifiers. Nicole Villanueva is currently located at home and patient is currently with them during visit. The provider, Rutherford Guys, MD is located in their office at time of visit.  I discussed the limitations, risks, security and privacy concerns of performing an evaluation and management service by telephone and the availability of in person appointments. I also discussed with the patient that there may be a patient responsible charge related to this service. The patient expressed understanding and agreed to proceed.   CC: followup  HPI ? Patient is a 53 y.o. female with past medical history significant for HTN, GERD, DM2, HLPwho presents today forfollowup on cbg  Last OV March 6948 Added trulicity low dose, tolerating well after first dose basaglar 60 units at bedtime Using humalog only when cbgs > 250 Fasting cbgs better in the 200s instead of 300s Not checking cbg at bedtime Continues with metformin Eating mostly fish and veggies, does not potatoes nor rice Feels occ sweaty, cold, has not checked cbg  Lab Results  Component Value Date   HGBA1C 11.1 (A) 11/10/2018   HGBA1C 11.9 (H) 08/11/2018   HGBA1C 10.2 01/04/2018   Lab Results  Component Value Date   MICROALBUR 0.6 12/08/2014   LDLCALC 91 11/10/2018   CREATININE 0.42 (L) 11/10/2018    No Known Allergies  Prior to Admission medications   Medication Sig Start Date End Date Taking? Authorizing Provider  atorvastatin (LIPITOR) 10 MG tablet Take 1 tablet (10 mg total) by mouth daily. 11/10/18   Rutherford Guys, MD  blood glucose meter kit and supplies Per insurance preference. Test blood glucose three times daily as directed. Dx E11.65, Z79.4 09/27/18   Rutherford Guys, MD  ciclopirox (PENLAC) 8 % solution APPLY 1 APPLICATION TOPICALLY DAILY 10/27/18   [provider]  Ciclopirox 8 % KIT Apply 1 application topically daily. 09/27/18   Rutherford Guys, MD  Dulaglutide (TRULICITY) 5.46 EV/0.3JK SOPN Inject 0.75 mg into the skin once a week. 11/10/18   Rutherford Guys, MD  Insulin Glargine (BASAGLAR KWIKPEN) 100 UNIT/ML SOPN Inject 0.6 mLs (60 Units total) into the skin at bedtime. 09/27/18   Rutherford Guys, MD  insulin lispro (HUMALOG KWIKPEN) 100 UNIT/ML KwikPen Inject 0.1 mLs (10 Units total) into the skin 3 (three) times daily. 09/27/18   Rutherford Guys, MD  Insulin Pen Needle (CAREFINE PEN NEEDLES) 32G X 4 MM MISC Use 4x a day 09/27/18   Rutherford Guys, MD  lisinopril (PRINIVIL,ZESTRIL) 5 MG tablet Take 1 tablet (5 mg total) by mouth daily. 09/27/18   Rutherford Guys, MD  metFORMIN (GLUCOPHAGE) 1000 MG tablet TAKE 1 TABLET BY MOUTH TWICE DAILY WITH A MEAL 09/27/18   Rutherford Guys, MD    Past Medical History:  Diagnosis Date  . Diabetes mellitus   . Hyperlipidemia   . Hypertension     Past Surgical History:  Procedure Laterality Date  . ABDOMINAL HYSTERECTOMY    . CHOLECYSTECTOMY      Social History   Tobacco Use  . Smoking status: Never Smoker  . Smokeless tobacco: Never Used  Substance Use Topics  . Alcohol use: No    Family History  Problem Relation Age of Onset  . Diabetes Mother   . Diabetes Father     ROS Per hpi  Objective  Vitals as reported by the patient: none   ASSESSMENT and PLAN  1. Uncontrolled type 2 diabetes mellitus with hyperglycemia, with long-term current use of insulin (HCC) Tolerating low dose trulicity Explained cont with humalog 10 units AC Discussed titration basaglar by 5 units a week, fasting goal 90-130 Continue with LFM  Other orders - Insulin Glargine (BASAGLAR KWIKPEN) 100 UNIT/ML SOPN; Inject 0.8 mLs (80 Units total) into the skin at bedtime.  FOLLOW-UP: 4 weeks   The above assessment and management plan was discussed with the patient. The patient verbalized understanding of  and has agreed to the management plan. Patient is aware to call the clinic if symptoms persist or worsen. Patient is aware when to return to the clinic for a follow-up visit. Patient educated on when it is appropriate to go to the emergency department.    I provided 10 minutes of non-face-to-face time during this encounter.  Rutherford Guys, MD Primary Care at [place], [place] [address] Ph.  [phone] Fax [phone]

## 2019-01-26 ENCOUNTER — Ambulatory Visit: Payer: 59 | Admitting: Family Medicine

## 2019-06-08 LAB — HM DIABETES EYE EXAM

## 2019-06-24 ENCOUNTER — Other Ambulatory Visit: Payer: Self-pay | Admitting: Family Medicine

## 2019-06-24 DIAGNOSIS — B351 Tinea unguium: Secondary | ICD-10-CM

## 2019-06-24 NOTE — Telephone Encounter (Signed)
Forwarding medication refill request to the clinical pool for review. 

## 2019-06-29 ENCOUNTER — Telehealth: Payer: Self-pay | Admitting: *Deleted

## 2019-06-29 NOTE — Telephone Encounter (Signed)
Sent to scheduling pool.

## 2019-06-30 NOTE — Telephone Encounter (Signed)
LVMTCB

## 2019-07-30 ENCOUNTER — Other Ambulatory Visit: Payer: Self-pay | Admitting: Family Medicine

## 2019-07-30 NOTE — Telephone Encounter (Signed)
Requested medication (s) are due for refill today: yes  Requested medication (s) are on the active medication list: yes  Last refill: 06/24/2019  Future visit scheduled: no  Notes to clinic:  Overdue for office visit  Review for refill   Requested Prescriptions  Pending Prescriptions Disp Refills   TRULICITY 5.45 GY/5.6LS SOPN [Pharmacy Med Name: Trulicity 9.37 DS/2.8JG Subcutaneous Solution Pen-injector] 4 mL 0    Sig: INJECT 0.75 MG INTO THE SKIN ONCE A WEEK     Endocrinology:  Diabetes - GLP-1 Receptor Agonists Failed - 07/30/2019 11:03 AM      Failed - HBA1C is between 0 and 7.9 and within 180 days    Hemoglobin A1C  Date Value Ref Range Status  11/10/2018 11.1 (A) 4.0 - 5.6 % Final   Hgb A1c MFr Bld  Date Value Ref Range Status  08/11/2018 11.9 (H) 4.8 - 5.6 % Final    Comment:             Prediabetes: 5.7 - 6.4          Diabetes: >6.4          Glycemic control for adults with diabetes: <7.0          Failed - Valid encounter within last 6 months    Recent Outpatient Visits          7 months ago Uncontrolled type 2 diabetes mellitus with hyperglycemia, with long-term current use of insulin ([place])   [facility] at [facility], Lilia Argue, MD   8 months ago Uncontrolled type 2 diabetes mellitus with hyperglycemia, with long-term current use of insulin [facility])   [facility] at [facility], Lilia Argue, MD   10 months ago Uncontrolled type 2 diabetes mellitus with hyperglycemia, with long-term current use of insulin [facility])   Primary Care at [facility], Lilia Argue, MD   11 months ago Boil of groin   [facility] at [facility], Lilia Argue, MD   1 year ago Non-recurrent abdominal hernia without obstruction or gangrene, unspecified hernia type   Primary Care at [facility], [place]ene Mink, [place]

## 2019-12-20 ENCOUNTER — Other Ambulatory Visit: Payer: Self-pay | Admitting: Family Medicine

## 2019-12-20 DIAGNOSIS — Z1231 Encounter for screening mammogram for malignant neoplasm of breast: Secondary | ICD-10-CM

## 2019-12-26 ENCOUNTER — Other Ambulatory Visit: Payer: Self-pay

## 2019-12-26 ENCOUNTER — Encounter: Payer: Self-pay | Admitting: Family Medicine

## 2019-12-26 ENCOUNTER — Telehealth (INDEPENDENT_AMBULATORY_CARE_PROVIDER_SITE_OTHER): Payer: 59 | Admitting: Family Medicine

## 2019-12-26 DIAGNOSIS — Z794 Long term (current) use of insulin: Secondary | ICD-10-CM

## 2019-12-26 DIAGNOSIS — E1165 Type 2 diabetes mellitus with hyperglycemia: Secondary | ICD-10-CM

## 2019-12-26 MED ORDER — LISINOPRIL 5 MG PO TABS: 5.0000 mg | ORAL_TABLET | Freq: Every day | ORAL | 1 refills | Status: DC

## 2019-12-26 MED ORDER — ATORVASTATIN CALCIUM 10 MG PO TABS: 10.0000 mg | ORAL_TABLET | Freq: Every day | ORAL | 1 refills | Status: DC

## 2019-12-26 MED ORDER — METFORMIN HCL 1000 MG PO TABS: ORAL_TABLET | ORAL | 1 refills | Status: DC

## 2019-12-26 MED ORDER — TRULICITY 0.75 MG/0.5ML ~~LOC~~ SOAJ: SUBCUTANEOUS | 1 refills | Status: DC

## 2019-12-26 MED ORDER — INSULIN GLARGINE 100 UNIT/ML SOLOSTAR PEN: 20.0000 [IU] | PEN_INJECTOR | Freq: Every day | SUBCUTANEOUS | 1 refills | Status: DC

## 2019-12-26 NOTE — Progress Notes (Signed)
Virtual Visit Note  I connected with patient on 12/26/19 at 511pm by video doximity and verified that I am speaking with the correct person using two identifiers. Nicole Villanueva is currently located at home and patient is currently with them during visit. The provider, Rutherford Guys, MD is located in their office at time of visit.  I discussed the limitations, risks, security and privacy concerns of performing an evaluation and management service by telephone and the availability of in person appointments. I also discussed with the patient that there may be a patient responsible charge related to this service. The patient expressed understanding and agreed to proceed.   I provided 19 minutes of non-face-to-face time during this encounter.  Chief Complaint  Patient presents with  . Diabetes    medication refills , out of insulin x 1 month , insuarnce doesn't cover them  . Hypertension    has not taken medications     HPI  Last OV a year ago - telemedicine? PMH: HTN, GERD, DM2, HLP Patient afraid to come in due to covid Now has been vaccinated against covid She has not been following DM diet No weight loss or gain No polydipsia or polyuria Mild vision blurriness No feet numbness or tingling Continues to check cbgs cbgs higher 400s x2 months (after dinner) Fasting: 260-300s Has been wo insulin for about 1 month - insurance not coverage Currently using only metformin (no other meds as needed refills)  Denies any fever or chills Denies any cough, SOB, CP, abd pain, vomiting Mild nausea   No Known Allergies  Prior to Admission medications   Medication Sig Start Date End Date Taking? Authorizing Provider  metFORMIN (GLUCOPHAGE) 1000 MG tablet TAKE 1 TABLET BY MOUTH TWICE DAILY WITH A MEAL 09/27/18  Yes Rutherford Guys, MD  atorvastatin (LIPITOR) 10 MG tablet Take 1 tablet (10 mg total) by mouth daily. Patient not taking: Reported on 12/26/2019 11/10/18   Rutherford Guys, MD  blood glucose meter kit and supplies Per insurance preference. Test blood glucose three times daily as directed. Dx E11.65, Z79.4 Patient not taking: Reported on 12/26/2019 09/27/18   Rutherford Guys, MD  ciclopirox [facility]) 8 % solution APPLY 1 APPLICATION TOPICALLY DAILY 10/27/18   [provider]  Ciclopirox 8 % KIT Apply 1 application topically daily. Patient not taking: Reported on 12/26/2019 09/27/18   Rutherford Guys, MD  Insulin Glargine [facility]) 100 UNIT/ML SOPN Inject 0.8 mLs (80 Units total) into the skin at bedtime. Patient not taking: Reported on 12/26/2019 12/24/18   Rutherford Guys, MD  insulin lispro (HUMALOG KWIKPEN) 100 UNIT/ML KwikPen Inject 0.1 mLs (10 Units total) into the skin 3 (three) times daily. Patient not taking: Reported on 12/26/2019 09/27/18   Rutherford Guys, MD  Insulin Pen Needle (CAREFINE PEN NEEDLES) 32G X 4 MM MISC Use 4x a day Patient not taking: Reported on 12/26/2019 09/27/18   Rutherford Guys, MD  lisinopril (ZESTRIL) 5 MG tablet Take 1 tablet by mouth once daily Patient not taking: Reported on 12/26/2019 06/29/19   Rutherford Guys, MD  TRULICITY 7.93 JQ/3.0SP SOPN INJECT 0.75 MG INTO THE SKIN ONCE A WEEK Patient not taking: Reported on 12/26/2019 08/04/19   Rutherford Guys, MD    Past Medical History:  Diagnosis Date  . Diabetes mellitus   . Hyperlipidemia   . Hypertension     Past Surgical History:  Procedure Laterality Date  . ABDOMINAL HYSTERECTOMY    .  CHOLECYSTECTOMY      Social History   Tobacco Use  . Smoking status: Never Smoker  . Smokeless tobacco: Never Used  Substance Use Topics  . Alcohol use: No    Family History  Problem Relation Age of Onset  . Diabetes Mother   . Diabetes Father     ROS Per hpi  Objective  Vitals as reported by the patient: none  GEN: AAOx3, NAD HEENT: [place]/AT, pupils are symmetrical, EOMI, non-icteric sclera Resp: breathing comfortably, speaking in full sentences Skin: no  rashes noted, no pallor Psych: good eye contact, normal mood and affect   ASSESSMENT and PLAN  1. Uncontrolled type 2 diabetes mellitus with hyperglycemia, with long-term current use of insulin (HCC) Uncontrolled. cbgs 400s. Restarting trulicity and lantus. discussed lantus titration 2units q3 days. Restarting statin and low dose ACE. Discussed consequences of uncontrolled DM  - metFORMIN (GLUCOPHAGE) 1000 MG tablet; TAKE 1 TABLET BY MOUTH TWICE DAILY WITH A MEAL  Other orders - Dulaglutide (TRULICITY) 2.58 FU/8.3AF SOPN; INJECT 0.75 MG INTO THE SKIN ONCE A WEEK - insulin glargine (LANTUS) 100 UNIT/ML Solostar Pen; Inject 20 Units into the skin at bedtime. - atorvastatin (LIPITOR) 10 MG tablet; Take 1 tablet (10 mg total) by mouth daily. - lisinopril (ZESTRIL) 5 MG tablet; Take 1 tablet (5 mg total) by mouth daily.  FOLLOW-UP: 4 weeks in office   The above assessment and management plan was discussed with the patient. The patient verbalized understanding of and has agreed to the management plan. Patient is aware to call the clinic if symptoms persist or worsen. Patient is aware when to return to the clinic for a follow-up visit. Patient educated on when it is appropriate to go to the emergency department.     Rutherford Guys, MD Primary Care at [place], [place] [address] Ph.  [phone] Fax [phone]

## 2019-12-26 NOTE — Patient Instructions (Signed)
° ° ° °  If you have lab work done today you will be contacted with your lab results within the next 2 weeks.  If you have not heard from us then please contact us. The fastest way to get your results is to register for My Chart. ° ° °IF you received an x-ray today, you will receive an invoice from [facility]. Please contact [facility] at [phone] with questions or concerns regarding your invoice.  ° °IF you received labwork today, you will receive an invoice from [facility]. Please contact [facility] at [phone] with questions or concerns regarding your invoice.  ° °Our billing staff will not be able to assist you with questions regarding bills from these companies. ° °You will be contacted with the lab results as soon as they are available. The fastest way to get your results is to activate your My Chart account. Instructions are located on the last page of this paperwork. If you have not heard from us regarding the results in 2 weeks, please contact this office. °  ° ° ° °

## 2020-01-19 ENCOUNTER — Other Ambulatory Visit: Payer: Self-pay

## 2020-01-19 ENCOUNTER — Encounter: Payer: Self-pay | Admitting: Family Medicine

## 2020-01-19 ENCOUNTER — Ambulatory Visit (INDEPENDENT_AMBULATORY_CARE_PROVIDER_SITE_OTHER): Payer: 59 | Admitting: Family Medicine

## 2020-01-19 VITALS — BP 107/71 | HR 80 | Temp 98.4°F | Ht 61.0 in | Wt 154.8 lb

## 2020-01-19 DIAGNOSIS — I10 Essential (primary) hypertension: Secondary | ICD-10-CM | POA: Diagnosis not present

## 2020-01-19 DIAGNOSIS — Z1211 Encounter for screening for malignant neoplasm of colon: Secondary | ICD-10-CM

## 2020-01-19 DIAGNOSIS — E1165 Type 2 diabetes mellitus with hyperglycemia: Secondary | ICD-10-CM | POA: Diagnosis not present

## 2020-01-19 DIAGNOSIS — Z794 Long term (current) use of insulin: Secondary | ICD-10-CM | POA: Diagnosis not present

## 2020-01-19 DIAGNOSIS — E785 Hyperlipidemia, unspecified: Secondary | ICD-10-CM | POA: Diagnosis not present

## 2020-01-19 DIAGNOSIS — B351 Tinea unguium: Secondary | ICD-10-CM

## 2020-01-19 LAB — POCT GLYCOSYLATED HEMOGLOBIN (HGB A1C): Hemoglobin A1C: 11.7 % — AB (ref 4.0–5.6)

## 2020-01-19 MED ORDER — TRULICITY 1.5 MG/0.5ML ~~LOC~~ SOAJ
1.5000 mg | SUBCUTANEOUS | 5 refills | Status: DC
Start: 2020-01-19 — End: 2020-08-02

## 2020-01-19 NOTE — Progress Notes (Signed)
5/27/20212:31 PM  Nicole Villanueva April 20, 1966, 54 y.o., female [id number]  Chief Complaint  Patient presents with  . Diabetes  . Hypertension  . Hyperlipidemia  . Health Maintenance    received vaccins for covis, will call with dates. will do pap nx visit, does not want p23    HPI:   Patient is a 54 y.o. female with past medical history significant for HTN, GERD, DM2, HLP who presents today for followup  Last OV 2 weeks ago - telemedicine Restarted lantus and trulicity  This morning fasting 138 After meals highest 326 Denies any lows lantus 55 units  Trying to work on diet  Requesting rx for treatment of toenail with fungal disease Has been trying OTC medications and not getting better Most affected is left great toenail  Depression screen [facility] 2/9 01/19/2020 12/26/2019 12/24/2018  Decreased Interest 0 0 0  Down, Depressed, Hopeless 0 0 0  PHQ - 2 Score 0 0 0  Altered sleeping - - -  Tired, decreased energy - - -  Change in appetite - - -  Feeling bad or failure about yourself  - - -  Trouble concentrating - - -  Moving slowly or fidgety/restless - - -  Suicidal thoughts - - -  PHQ-9 Score - - -    Fall Risk  01/19/2020 12/26/2019 12/24/2018 11/10/2018 09/27/2018  Falls in the past year? 0 0 0 0 0  Number falls in past yr: 0 0 0 0 0  Injury with Fall? 0 0 0 0 1  Follow up - Falls evaluation completed - - -     No Known Allergies  Prior to Admission medications   Medication Sig Start Date End Date Taking? Authorizing Provider  atorvastatin (LIPITOR) 10 MG tablet Take 1 tablet (10 mg total) by mouth daily. 12/26/19  Yes Rutherford Guys, MD  blood glucose meter kit and supplies Per insurance preference. Test blood glucose three times daily as directed. Dx E11.65, Z79.4 09/27/18  Yes Rutherford Guys, MD  ciclopirox (PENLAC) 8 % solution APPLY 1 APPLICATION TOPICALLY DAILY 10/27/18  Yes [provider]  Dulaglutide (TRULICITY) 0.62 IR/4.8NI SOPN INJECT 0.75 MG  INTO THE SKIN ONCE A WEEK 12/26/19  Yes Rutherford Guys, MD  insulin glargine (LANTUS) 100 UNIT/ML Solostar Pen Inject 20 Units into the skin at bedtime. 12/26/19  Yes Rutherford Guys, MD  Insulin Pen Needle (CAREFINE PEN NEEDLES) 32G X 4 MM MISC Use 4x a day 09/27/18  Yes Rutherford Guys, MD  lisinopril (ZESTRIL) 5 MG tablet Take 1 tablet (5 mg total) by mouth daily. 12/26/19  Yes Rutherford Guys, MD  metFORMIN (GLUCOPHAGE) 1000 MG tablet TAKE 1 TABLET BY MOUTH TWICE DAILY WITH A MEAL 12/26/19  Yes Rutherford Guys, MD    Past Medical History:  Diagnosis Date  . Diabetes mellitus   . Hyperlipidemia   . Hypertension     Past Surgical History:  Procedure Laterality Date  . ABDOMINAL HYSTERECTOMY    . CHOLECYSTECTOMY      Social History   Tobacco Use  . Smoking status: Never Smoker  . Smokeless tobacco: Never Used  Substance Use Topics  . Alcohol use: No    Family History  Problem Relation Age of Onset  . Diabetes Mother   . Diabetes Father     Review of Systems  Constitutional: Negative for chills and fever.  Respiratory: Negative for cough and shortness of breath.   Cardiovascular: Negative for chest  pain, palpitations and leg swelling.  Gastrointestinal: Negative for abdominal pain, nausea and vomiting.     OBJECTIVE:  Today's Vitals   01/19/20 1412  BP: 107/71  Pulse: 80  Temp: 98.4 F (36.9 C)  SpO2: 98%  Weight: 154 lb 12.8 oz (70.2 kg)  Height: '5\' 1"'  (1.549 m)   Body mass index is 29.25 kg/m.  Wt Readings from Last 3 Encounters:  01/19/20 154 lb 12.8 oz (70.2 kg)  11/10/18 159 lb 9.6 oz (72.4 kg)  09/27/18 156 lb (70.8 kg)    Physical Exam Vitals and nursing note reviewed.  Constitutional:      Appearance: She is well-developed.  HENT:     Head: Normocephalic and atraumatic.     Mouth/Throat:     Pharynx: No oropharyngeal exudate.  Eyes:     General: No scleral icterus.    Conjunctiva/sclera: Conjunctivae normal.     Pupils: Pupils are equal,  round, and reactive to light.  Cardiovascular:     Rate and Rhythm: Normal rate and regular rhythm.     Heart sounds: Normal heart sounds. No murmur. No friction rub. No gallop.   Pulmonary:     Effort: Pulmonary effort is normal.     Breath sounds: Normal breath sounds. No wheezing or rales.  Musculoskeletal:     Cervical back: Neck supple.  Feet:     Right foot:     Toenail Condition: Right toenails are abnormally thick. Fungal disease present.    Left foot:     Toenail Condition: Left toenails are abnormally thick. Fungal disease present. Skin:    General: Skin is warm and dry.  Neurological:     Mental Status: She is alert and oriented to person, place, and time.     Results for orders placed or performed in visit on 01/19/20 (from the past 24 hour(s))  POCT glycosylated hemoglobin (Hb A1C)     Status: Abnormal   Collection Time: 01/19/20  2:40 PM  Result Value Ref Range   Hemoglobin A1C 11.7 (A) 4.0 - 5.6 %   HbA1c POC (<> result, manual entry)     HbA1c, POC (prediabetic range)     HbA1c, POC (controlled diabetic range)      No results found.   ASSESSMENT and PLAN  1. Uncontrolled type 2 diabetes mellitus with hyperglycemia, with long-term current use of insulin ([place]) Not controlled. cbgs improving since restarted meds. Increase trulicity. Cont working on LFM - TSH - Microalbumin / creatinine urine ratio - Lipid panel - CMP14+EGFR - POCT glycosylated hemoglobin (Hb A1C) - HM DIABETES FOOT EXAM  2. Essential hypertension Controlled. Continue current regime.  - TSH - Lipid panel - CMP14+EGFR  3. Hyperlipidemia, unspecified hyperlipidemia type Checking labs today, medications will be adjusted as needed.  - TSH - Lipid panel - CMP14+EGFR  4. Screening for colon cancer - Cologuard  5. Onychomycosis If normal LFTS consider terbenafine, reviewed r/se/b  Other orders - Dulaglutide (TRULICITY) 1.5 MB/8.6LJ SOPN; Inject 1.5 mg into the skin once a  week.  Return in about 3 months (around 04/20/2020).    Rutherford Guys, MD Primary Care at [place], [place] [address] Ph.  [phone] Fax [phone]

## 2020-01-19 NOTE — Patient Instructions (Signed)
° ° ° °  If you have lab work done today you will be contacted with your lab results within the next 2 weeks.  If you have not heard from us then please contact us. The fastest way to get your results is to register for My Chart. ° ° °IF you received an x-ray today, you will receive an invoice from [facility]. Please contact  Radiology at [phone] with questions or concerns regarding your invoice.  ° °IF you received labwork today, you will receive an invoice from [facility]. Please contact [facility] at [phone] with questions or concerns regarding your invoice.  ° °Our billing staff will not be able to assist you with questions regarding bills from these companies. ° °You will be contacted with the lab results as soon as they are available. The fastest way to get your results is to activate your My Chart account. Instructions are located on the last page of this paperwork. If you have not heard from us regarding the results in 2 weeks, please contact this office. °  ° ° ° °

## 2020-01-20 LAB — CMP14+EGFR
ALT: 41 IU/L — ABNORMAL HIGH (ref 0–32)
AST: 40 IU/L (ref 0–40)
Albumin/Globulin Ratio: 1.6 (ref 1.2–2.2)
Albumin: 4.6 g/dL (ref 3.8–4.9)
Alkaline Phosphatase: 93 IU/L (ref 48–121)
BUN/Creatinine Ratio: 21 (ref 9–23)
BUN: 10 mg/dL (ref 6–24)
Bilirubin Total: 0.3 mg/dL (ref 0.0–1.2)
CO2: 25 mmol/L (ref 20–29)
Calcium: 10.1 mg/dL (ref 8.7–10.2)
Chloride: 98 mmol/L (ref 96–106)
Creatinine, Ser: 0.47 mg/dL — ABNORMAL LOW (ref 0.57–1.00)
GFR calc Af Amer: 131 mL/min/{1.73_m2} (ref >59)
GFR calc non Af Amer: 114 mL/min/{1.73_m2} (ref >59)
Globulin, Total: 2.8 g/dL (ref 1.5–4.5)
Glucose: 110 mg/dL — ABNORMAL HIGH (ref 65–99)
Potassium: 4.7 mmol/L (ref 3.5–5.2)
Sodium: 138 mmol/L (ref 134–144)
Total Protein: 7.4 g/dL (ref 6.0–8.5)

## 2020-01-20 LAB — TSH: TSH: 1.38 u[IU]/mL (ref 0.450–4.500)

## 2020-01-20 LAB — MICROALBUMIN / CREATININE URINE RATIO
Creatinine, Urine: 35.4 mg/dL
Microalb/Creat Ratio: 14 mg/g creat (ref 0–29)
Microalbumin, Urine: 5 ug/mL

## 2020-01-20 LAB — LIPID PANEL
Chol/HDL Ratio: 6.9 ratio — ABNORMAL HIGH (ref 0.0–4.4)
Cholesterol, Total: 271 mg/dL — ABNORMAL HIGH (ref 100–199)
HDL: 39 mg/dL — ABNORMAL LOW (ref >39)
LDL Chol Calc (NIH): 159 mg/dL — ABNORMAL HIGH (ref 0–99)
Triglycerides: 384 mg/dL — ABNORMAL HIGH (ref 0–149)
VLDL Cholesterol Cal: 73 mg/dL — ABNORMAL HIGH (ref 5–40)

## 2020-01-26 ENCOUNTER — Other Ambulatory Visit: Payer: Self-pay | Admitting: Family Medicine

## 2020-02-21 ENCOUNTER — Other Ambulatory Visit: Payer: Self-pay | Admitting: Family Medicine

## 2020-02-21 NOTE — Telephone Encounter (Signed)
Requested medication (s) are due for refill today:yes  Requested medication (s) are on the active medication list: No ordered as kit  Last refill:  09/27/18  Future visit scheduled: yes  Notes to clinic: last ordered as kit with monitor    Requested Prescriptions  Pending Prescriptions Disp Refills   ONETOUCH VERIO test strip [Pharmacy Med Name: OneTouch Verio In Vitro Strip] 100 each 0    Sig: USE  STRIP TO [place]      Endocrinology: Diabetes - Testing Supplies Passed - 02/21/2020  3:58 PM      Passed - Valid encounter within last 12 months    Recent Outpatient Visits           1 month ago Uncontrolled type 2 diabetes mellitus with hyperglycemia, with long-term current use of insulin ([place])   Primary Care at [facility], Lilia Argue, MD   1 month ago Uncontrolled type 2 diabetes mellitus with hyperglycemia, with long-term current use of insulin [facility])   Primary Care at [facility], Lilia Argue, MD   1 year ago Uncontrolled type 2 diabetes mellitus with hyperglycemia, with long-term current use of insulin [facility])   [facility] at [facility], Lilia Argue, MD   1 year ago Uncontrolled type 2 diabetes mellitus with hyperglycemia, with long-term current use of insulin [facility])   Primary Care at [facility], Lilia Argue, MD   1 year ago Uncontrolled type 2 diabetes mellitus with hyperglycemia, with long-term current use of insulin [facility])   Primary Care at [facility], Lilia Argue, MD       Future Appointments             In 1 month Rutherford Guys, MD Primary Care at [place], [facility]

## 2020-03-17 ENCOUNTER — Other Ambulatory Visit: Payer: Self-pay | Admitting: Family Medicine

## 2020-03-17 NOTE — Telephone Encounter (Signed)
Requested Prescriptions  Pending Prescriptions Disp Refills   LANTUS SOLOSTAR 100 UNIT/ML Solostar Pen [Pharmacy Med Name: Lantus SoloStar 100 UNIT/ML Subcutaneous Solution Pen-injector] 15 mL 0    Sig: INJECT 20 UNITS SUBCUTANEOUSLY AT BEDTIME     Endocrinology:  Diabetes - Insulins Failed - 03/17/2020  5:04 PM      Failed - HBA1C is between 0 and 7.9 and within 180 days    Hemoglobin A1C  Date Value Ref Range Status  01/19/2020 11.7 (A) 4.0 - 5.6 % Final   Hgb A1c MFr Bld  Date Value Ref Range Status  08/11/2018 11.9 (H) 4.8 - 5.6 % Final    Comment:             Prediabetes: 5.7 - 6.4          Diabetes: >6.4          Glycemic control for adults with diabetes: <7.0          Passed - Valid encounter within last 6 months    Recent Outpatient Visits          1 month ago Uncontrolled type 2 diabetes mellitus with hyperglycemia, with long-term current use of insulin (HCC)   [facility], Meda Coffee, MD   2 months ago Uncontrolled type 2 diabetes mellitus with hyperglycemia, with long-term current use of insulin [facility])   [facility], Meda Coffee, MD   1 year ago Uncontrolled type 2 diabetes mellitus with hyperglycemia, with long-term current use of insulin [facility])   [facility], Meda Coffee, MD   1 year ago Uncontrolled type 2 diabetes mellitus with hyperglycemia, with long-term current use of insulin [facility])   [facility], Meda Coffee, MD   1 year ago Uncontrolled type 2 diabetes mellitus with hyperglycemia, with long-term current use of insulin [facility])   [facility], Meda Coffee, MD      Future Appointments            In 1 month Myles Lipps, MD Primary [facility] at [place], [facility]

## 2020-04-20 ENCOUNTER — Ambulatory Visit: Payer: 59 | Admitting: Family Medicine

## 2020-04-23 ENCOUNTER — Encounter: Payer: Self-pay | Admitting: Family Medicine

## 2020-04-27 ENCOUNTER — Ambulatory Visit: Payer: 59 | Admitting: Family Medicine

## 2020-04-27 ENCOUNTER — Other Ambulatory Visit: Payer: Self-pay

## 2020-04-27 ENCOUNTER — Encounter: Payer: Self-pay | Admitting: Family Medicine

## 2020-04-27 VITALS — BP 135/80 | HR 87 | Temp 98.1°F | Resp 15 | Ht 61.0 in | Wt 156.8 lb

## 2020-04-27 DIAGNOSIS — I10 Essential (primary) hypertension: Secondary | ICD-10-CM

## 2020-04-27 DIAGNOSIS — E1165 Type 2 diabetes mellitus with hyperglycemia: Secondary | ICD-10-CM | POA: Diagnosis not present

## 2020-04-27 DIAGNOSIS — Z794 Long term (current) use of insulin: Secondary | ICD-10-CM | POA: Diagnosis not present

## 2020-04-27 DIAGNOSIS — E782 Mixed hyperlipidemia: Secondary | ICD-10-CM

## 2020-04-27 MED ORDER — GLIMEPIRIDE 2 MG PO TABS: 2.0000 mg | ORAL_TABLET | Freq: Every day | ORAL | 3 refills | Status: DC

## 2020-04-27 MED ORDER — TRESIBA FLEXTOUCH 200 UNIT/ML ~~LOC~~ SOPN: 70.0000 [IU] | PEN_INJECTOR | Freq: Every day | SUBCUTANEOUS | 3 refills | Status: DC

## 2020-04-27 NOTE — Progress Notes (Signed)
9/3/20214:57 PM  Nicole Villanueva 15-Dec-1965, 54 y.o., female [id number]  Chief Complaint  Patient presents with  . Diabetes    pt has worked on reducing the amount she is eating but has not changed what she is eating  . Hyperlipidemia    pt has limited how much she is eating has not changed what she is eating, pt would like her levels rechecked   . Hypertension    pt has had a lot of dizzy spells, some days are worse than others, does not take BP at home     HPI:   Patient is a 54 y.o. 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, she has been doing electrolytes but they are high in glucose Denies any polyuria, polydipsia, blurry vision Declines flu vaccine  Lab Results  Component Value Date   HGBA1C 11.7 (A) 01/19/2020   HGBA1C 11.1 (A) 11/10/2018   HGBA1C 11.9 (H) 08/11/2018   Lab Results  Component Value Date   MICROALBUR 0.6 12/08/2014   LDLCALC 159 (H) 01/19/2020   CREATININE 0.47 (L) 01/19/2020   Wt Readings from Last 3 Encounters:  04/27/20 156 lb 12.8 oz (71.1 kg)  01/19/20 154 lb 12.8 oz (70.2 kg)  11/10/18 159 lb 9.6 oz (72.4 kg)   BP Readings from Last 3 Encounters:  04/27/20 135/80  01/19/20 107/71  11/10/18 128/60    Depression screen PHQ 2/9 04/27/2020 01/19/2020 12/26/2019  Decreased Interest 0 0 0  Down, Depressed, Hopeless 0 0 0  PHQ - 2 Score 0 0 0  Altered sleeping - - -  Tired, decreased  energy - - -  Change in appetite - - -  Feeling bad or failure about yourself  - - -  Trouble concentrating - - -  Moving slowly or fidgety/restless - - -  Suicidal thoughts - - -  PHQ-9 Score - - -    Fall Risk  04/27/2020 01/19/2020 12/26/2019 12/24/2018 11/10/2018  Falls in the past year? 0 0 0 0 0  Number falls in past yr: 0 0 0 0 0  Injury with Fall? 0 0 0 0 0  Risk for fall due to : No Fall Risks - - - -  Follow up Falls evaluation completed - Falls evaluation completed - -     No Known Allergies  Prior to Admission medications   Medication Sig Start Date End Date Taking? Authorizing Provider  atorvastatin (LIPITOR) 10 MG tablet Take 1 tablet (10 mg total) by mouth daily. 12/26/19  Yes Rutherford Guys, MD  blood glucose meter kit and supplies Per insurance preference. Test blood glucose three times daily as directed. Dx E11.65, Z79.4 09/27/18  Yes Rutherford Guys, MD  Dulaglutide (TRULICITY) 1.5 OE/3.2ZY SOPN Inject 1.5 mg into the skin once a week. 01/19/20  Yes Rutherford Guys, MD  Insulin Pen Needle (CAREFINE PEN NEEDLES) 32G X 4 MM MISC Use 4x  a day 09/27/18  Yes Rutherford Guys, MD  Lancets (ONETOUCH DELICA PLUS [id number]) [place] USE   TO CHECK GLUCOSE THREE TIMES DAILY 01/26/20  Yes Rutherford Guys, MD  LANTUS SOLOSTAR 100 UNIT/ML Solostar Pen INJECT 20 UNITS SUBCUTANEOUSLY AT BEDTIME 03/17/20  Yes Rutherford Guys, MD  lisinopril (ZESTRIL) 5 MG tablet Take 1 tablet (5 mg total) by mouth daily. 12/26/19  Yes Rutherford Guys, MD  metFORMIN (GLUCOPHAGE) 1000 MG tablet TAKE 1 TABLET BY MOUTH TWICE DAILY WITH A MEAL 12/26/19  Yes Rutherford Guys, MD  [facility] VERIO test strip USE  STRIP TO CHECK GLUCOSE THREE TIMES DAILY 02/21/20  Yes Rutherford Guys, MD  ciclopirox [facility]) 8 % solution APPLY 1 APPLICATION TOPICALLY DAILY Patient not taking: Reported on 04/27/2020 10/27/18   [provider]    Past Medical History:  Diagnosis Date  . Diabetes mellitus   . Hyperlipidemia   .  Hypertension     Past Surgical History:  Procedure Laterality Date  . ABDOMINAL HYSTERECTOMY    . CHOLECYSTECTOMY      Social History   Tobacco Use  . Smoking status: Never Smoker  . Smokeless tobacco: Never Used  Substance Use Topics  . Alcohol use: No    Family History  Problem Relation Age of Onset  . Diabetes Mother   . Diabetes Father     Review of Systems  Constitutional: Negative for chills and fever.  Respiratory: Negative for cough and shortness of breath.   Cardiovascular: Negative for chest pain, palpitations and leg swelling.  Gastrointestinal: Negative for abdominal pain, nausea and vomiting.   Per hpi  OBJECTIVE:  Today's Vitals   04/27/20 1629  BP: 135/80  Pulse: 87  Resp: 15  Temp: 98.1 F (36.7 C)  TempSrc: Temporal  SpO2: 97%  Weight: 156 lb 12.8 oz (71.1 kg)  Height: '5\' 1"'  (1.549 m)   Body mass index is 29.63 kg/m.   Physical Exam Vitals and nursing note reviewed.  Constitutional:      Appearance: She is well-developed.  HENT:     Head: Normocephalic and atraumatic.  Eyes:     General: No scleral icterus.    Conjunctiva/sclera: Conjunctivae normal.     Pupils: Pupils are equal, round, and reactive to light.  Pulmonary:     Effort: Pulmonary effort is normal.  Musculoskeletal:     Cervical back: Neck supple.  Skin:    General: Skin is warm and dry.  Neurological:     Mental Status: She is alert and oriented to person, place, and time.     No results found for this or any previous visit (from the past 24 hour(s)).  No results found.   ASSESSMENT and PLAN  1. Uncontrolled type 2 diabetes mellitus with hyperglycemia, with long-term current use of insulin (HCC) Change to tresiba as needing to increase past 50 units. Adding glimperide, reviewed r/se/b, avoiding SGLT2 as increased risk of dehydration at work, discussed importance of pushing fluids. Declines referral to endo, might need meal time insulin.  - Hemoglobin  A1c  2. Essential hypertension Controlled. Continue current regime.  - Comprehensive metabolic panel  3. Mixed hyperlipidemia Checking labs today, medications will be adjusted as needed.  - Lipid panel  Other orders - insulin degludec (TRESIBA FLEXTOUCH) 200 UNIT/ML FlexTouch Pen; Inject 70 Units into the skin at bedtime. - glimepiride (AMARYL) 2 MG tablet; Take 1 tablet (2 mg total) by mouth daily before breakfast.  Return in about 4  weeks (around 05/25/2020).    Rutherford Guys, MD Primary Care at [place], [place] [address] Ph.  [phone] Fax [phone]

## 2020-04-27 NOTE — Patient Instructions (Addendum)
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  If you have not heard from [place] then please contact us. The fastest way to get your results is to register for My Chart.   IF you received an x-ray today, you will receive an invoice from [facility] Radiology. Please contact [facility] Radiology at [phone] with questions or concerns regarding your invoice.   IF you received labwork today, you will receive an invoice from [facility]. Please contact [facility] at [phone] with questions or concerns regarding your invoice.   Our billing staff will not be able to assist you with questions regarding bills from these companies.  You will be contacted with the lab results as soon as they are available. The fastest way to get your results is to activate your My Chart account. Instructions are located on the last page of this paperwork. If you have not heard from [place] regarding the results in 2 weeks, please contact this office.

## 2020-04-28 LAB — COMPREHENSIVE METABOLIC PANEL
ALT: 25 IU/L (ref 0–32)
AST: 22 IU/L (ref 0–40)
Albumin/Globulin Ratio: 1.7 (ref 1.2–2.2)
Albumin: 4.2 g/dL (ref 3.8–4.9)
Alkaline Phosphatase: 81 IU/L (ref 48–121)
BUN/Creatinine Ratio: 18 (ref 9–23)
BUN: 10 mg/dL (ref 6–24)
Bilirubin Total: <0.2 mg/dL (ref 0.0–1.2)
CO2: 25 mmol/L (ref 20–29)
Calcium: 9.5 mg/dL (ref 8.7–10.2)
Chloride: 101 mmol/L (ref 96–106)
Creatinine, Ser: 0.57 mg/dL (ref 0.57–1.00)
GFR calc Af Amer: 122 mL/min/{1.73_m2} (ref >59)
GFR calc non Af Amer: 106 mL/min/{1.73_m2} (ref >59)
Globulin, Total: 2.5 g/dL (ref 1.5–4.5)
Glucose: 99 mg/dL (ref 65–99)
Potassium: 4.1 mmol/L (ref 3.5–5.2)
Sodium: 138 mmol/L (ref 134–144)
Total Protein: 6.7 g/dL (ref 6.0–8.5)

## 2020-04-28 LAB — LIPID PANEL
Chol/HDL Ratio: 5.9 ratio — ABNORMAL HIGH (ref 0.0–4.4)
Cholesterol, Total: 202 mg/dL — ABNORMAL HIGH (ref 100–199)
HDL: 34 mg/dL — ABNORMAL LOW (ref >39)
LDL Chol Calc (NIH): 112 mg/dL — ABNORMAL HIGH (ref 0–99)
Triglycerides: 323 mg/dL — ABNORMAL HIGH (ref 0–149)
VLDL Cholesterol Cal: 56 mg/dL — ABNORMAL HIGH (ref 5–40)

## 2020-04-28 LAB — HEMOGLOBIN A1C
Est. average glucose Bld gHb Est-mCnc: 200 mg/dL
Hgb A1c MFr Bld: 8.6 % — ABNORMAL HIGH (ref 4.8–5.6)

## 2020-05-08 ENCOUNTER — Other Ambulatory Visit: Payer: Self-pay | Admitting: Family Medicine

## 2020-05-08 DIAGNOSIS — E1165 Type 2 diabetes mellitus with hyperglycemia: Secondary | ICD-10-CM

## 2020-05-08 NOTE — Telephone Encounter (Signed)
Requested Prescriptions  Pending Prescriptions Disp Refills  . lisinopril (ZESTRIL) 5 MG tablet [Pharmacy Med Name: Lisinopril 5 MG Oral Tablet] 90 tablet 1    Sig: Take 1 tablet by mouth once daily     Cardiovascular:  ACE Inhibitors Passed - 05/08/2020  4:07 PM      Passed - Cr in normal range and within 180 days    Creat  Date Value Ref Range Status  09/22/2017 0.43 (L) 0.50 - 1.05 mg/dL Final    Comment:    For patients >53 years of age, the reference limit for Creatinine is approximately 13% higher for people identified as African-American. .    Creatinine, Ser  Date Value Ref Range Status  04/27/2020 0.57 0.57 - 1.00 mg/dL Final         Passed - K in normal range and within 180 days    Potassium  Date Value Ref Range Status  04/27/2020 4.1 3.5 - 5.2 mmol/L Final         Passed - Patient is not pregnant      Passed - Last BP in normal range    BP Readings from Last 1 Encounters:  04/27/20 135/80         Passed - Valid encounter within last 6 months    Recent Outpatient Visits          1 week ago Uncontrolled type 2 diabetes mellitus with hyperglycemia, with long-term current use of insulin ([place])   [facility], Lilia Argue, MD   3 months ago Uncontrolled type 2 diabetes mellitus with hyperglycemia, with long-term current use of insulin ([place])   [facility], Lilia Argue, MD   4 months ago Uncontrolled type 2 diabetes mellitus with hyperglycemia, with long-term current use of insulin ([place])   [facility] at [facility], Lilia Argue, MD   1 year ago Uncontrolled type 2 diabetes mellitus with hyperglycemia, with long-term current use of insulin [facility])   [facility] at [facility], Lilia Argue, MD   1 year ago Uncontrolled type 2 diabetes mellitus with hyperglycemia, with long-term current use of insulin ([place])   [facility] at [facility], Lilia Argue, MD             . metFORMIN (GLUCOPHAGE) 1000 MG tablet [Pharmacy Med Name: metFORMIN  HCl 1000 MG Oral Tablet] 180 tablet 1    Sig: TAKE 1 TABLET BY MOUTH TWICE DAILY WITH A MEAL     Endocrinology:  Diabetes - Biguanides Failed - 05/08/2020  4:07 PM      Failed - HBA1C is between 0 and 7.9 and within 180 days    Hgb A1c MFr Bld  Date Value Ref Range Status  04/27/2020 8.6 (H) 4.8 - 5.6 % Final    Comment:             Prediabetes: 5.7 - 6.4          Diabetes: >6.4          Glycemic control for adults with diabetes: <7.0          Passed - Cr in normal range and within 360 days    Creat  Date Value Ref Range Status  09/22/2017 0.43 (L) 0.50 - 1.05 mg/dL Final    Comment:    For patients >86 years of age, the reference limit for Creatinine is approximately 13% higher for people identified as African-American. .    Creatinine, Ser  Date Value Ref  Range Status  04/27/2020 0.57 0.57 - 1.00 mg/dL Final         Passed - eGFR in normal range and within 360 days    GFR, Est African American  Date Value Ref Range Status  09/22/2017 137 > OR = 60 mL/min/1.57m Final   GFR calc Af Amer  Date Value Ref Range Status  04/27/2020 122 >59 mL/min/1.73 Final    Comment:    **[facility] currently reports eGFR in compliance with the current**   recommendations of the [employer] [facility] will   update reporting as new guidelines are published from the NKF-ASN   Task force.    GFR, Est Non African American  Date Value Ref Range Status  09/22/2017 119 > OR = 60 mL/min/1.779mFinal   GFR calc non Af Amer  Date Value Ref Range Status  04/27/2020 106 >59 mL/min/1.73 Final         Passed - Valid encounter within last 6 months    Recent Outpatient Visits          1 week ago Uncontrolled type 2 diabetes mellitus with hyperglycemia, with long-term current use of insulin ([facility]  [facility]   3 months ago Uncontrolled type 2 diabetes mellitus with hyperglycemia, with long-term current use of insulin ([facility]  [facility]   4 months ago Uncontrolled type 2 diabetes mellitus with hyperglycemia, with [facility]  [facility]   1 year ago Uncontrolled type 2 diabetes mellitus with hyperglycemia, [facility]  [facility]   1 year ago Uncontrolled type 2 diabetes mellitus with hyperglycemia, with long-term current use [facility] ([facility]  [facility]             . LANTUS SOLOSTAR 100 UNIT/ML Solostar Pen [[employer] Name: Lantus SoloStar 100 UNIT/ML Subcutaneous Solution Pen-injector] 15 mL     Sig: INJECT 20 UNITS SUBCUTANEOUSLY AT BEDTIME     Endocrinology:  Diabetes - Insulins Failed - 05/08/2020  4:07 PM      Failed - HBA1C is between 0 and 7.9 and within 180 days    Hgb A1c MFr Bld  Date Value Ref Range Status  04/27/2020 8.6 (H) 4.8 - 5.6 % Final    Comment:             Prediabetes: 5.7 - 6.4          Diabetes: >6.4          Glycemic control for adults with diabetes: <7.0          Passed - Valid encounter within last 6 months    Recent Outpatient Visits          1 week ago Uncontrolled type 2 diabetes mellitus with hyperglycemia, with long-term current use of insulin ([facility]  [facility]   3 months ago Uncontrolled type 2 diabetes mellitus with hyperglycemia, with [facility]  [facility]   4 months ago Uncontrolled type 2 diabetes mellitus with hyperglycemia, with long-term current use of [facility] ([facility]  [facility]   1 year ago Uncontrolled type 2 diabetes mellitus with hyperglycemia, with  long-term current use of insulin [facility])   [facility], Lilia Argue, MD   1 year ago Uncontrolled type 2 diabetes mellitus with hyperglycemia, with long-term current use of insulin [facility])   Primary  [facility] at [facility], Lilia Argue, MD

## 2020-05-21 ENCOUNTER — Ambulatory Visit
Admission: RE | Admit: 2020-05-21 | Discharge: 2020-05-21 | Disposition: A | Discharge status: Home / Self Care | Payer: 59 | Source: Ambulatory Visit | Attending: Family Medicine | Admitting: Family Medicine

## 2020-05-21 ENCOUNTER — Other Ambulatory Visit: Payer: Self-pay

## 2020-05-21 DIAGNOSIS — Z1231 Encounter for screening mammogram for malignant neoplasm of breast: Secondary | ICD-10-CM

## 2020-06-04 ENCOUNTER — Telehealth (INDEPENDENT_AMBULATORY_CARE_PROVIDER_SITE_OTHER): Payer: 59 | Admitting: Emergency Medicine

## 2020-06-04 ENCOUNTER — Encounter: Payer: Self-pay | Admitting: Emergency Medicine

## 2020-06-04 ENCOUNTER — Other Ambulatory Visit: Payer: Self-pay

## 2020-06-04 DIAGNOSIS — J988 Other specified respiratory disorders: Secondary | ICD-10-CM | POA: Diagnosis not present

## 2020-06-04 DIAGNOSIS — R059 Cough, unspecified: Secondary | ICD-10-CM

## 2020-06-04 DIAGNOSIS — R6889 Other general symptoms and signs: Secondary | ICD-10-CM

## 2020-06-04 DIAGNOSIS — E1165 Type 2 diabetes mellitus with hyperglycemia: Secondary | ICD-10-CM | POA: Diagnosis not present

## 2020-06-04 DIAGNOSIS — B9789 Other viral agents as the cause of diseases classified elsewhere: Secondary | ICD-10-CM

## 2020-06-04 DIAGNOSIS — Z794 Long term (current) use of insulin: Secondary | ICD-10-CM

## 2020-06-04 MED ORDER — BENZONATATE 200 MG PO CAPS: 200.0000 mg | ORAL_CAPSULE | Freq: Two times a day (BID) | ORAL | 0 refills | Status: DC | PRN

## 2020-06-04 MED ORDER — PROMETHAZINE-DM 6.25-15 MG/5ML PO SYRP: 5.0000 mL | ORAL_SOLUTION | Freq: Four times a day (QID) | ORAL | 0 refills | Status: DC | PRN

## 2020-06-04 MED ORDER — LANTUS SOLOSTAR 100 UNIT/ML ~~LOC~~ SOPN: 70.0000 [IU] | PEN_INJECTOR | Freq: Every day | SUBCUTANEOUS | 5 refills | Status: DC

## 2020-06-04 NOTE — Progress Notes (Signed)
Telemedicine Encounter- SOAP NOTE Established Patient My chart video encounter Patient: Home  Provider: Office     This video telephone encounter was conducted with the patient's (or proxy's) verbal consent via video audio telecommunications: yes/no: Yes Patient was instructed to have this encounter in a suitably private space; and to only have persons present to whom they give permission to participate. In addition, patient identity was confirmed by use of name plus two identifiers (DOB and address).  I discussed the limitations, risks, security and privacy concerns of performing an evaluation and management service by telephone and the availability of in person appointments. I also discussed with the patient that there may be a patient responsible charge related to this service. The patient expressed understanding and agreed to proceed.  I spent a total of TIME; 0 MIN TO 60 MIN: 20 minutes talking with the patient or their proxy.  Chief Complaint  Patient presents with   Cough    w/ congestion x 5 days causing pain    Nasal Congestion    and sneezing, not taking OTC meds   Medication Management    states tresiba is not coverd by insurance , a different rx needed    Subjective   Nicole Villanueva is a 54 y.o. female established patient. First encounter with me, Dr. Ardyth Gal patient. Telephone visit today complaining of flulike symptoms that started 5 days ago with cough and sinus congestion. Slowly getting better. Denies fever or chills. Denies difficulty breathing. Denies GI symptoms. Denies loss of smell or taste. Fully vaccinated against Covid. No one else sick at home. No other associated symptoms. Also patient has a history of diabetes. Recently prescribed Tyler Aas but not covered by insurance. Requesting Lantus insulin prescription. She normally takes 70 units at bedtime. No other complaints or medical concerns today.  HPI   Patient Active Problem List    Diagnosis Date Noted   Cough 10/13/2016   Uncontrolled type 2 diabetes mellitus with hyperglycemia, with long-term current use of insulin ([place]) 10/09/2016   Diabetic retinopathy ([place]) 06/13/2016   Uterovaginal prolapse 11/03/2014   Prolapse of anterior vaginal wall 11/03/2014   Prolapse of female pelvic organs 06/09/2014   Hyperlipidemia 03/29/2014   Depressive disorder, not elsewhere classified 03/29/2014   Hot flashes 03/29/2014   Esophageal reflux 03/29/2014   Vasovagal syndrome 03/09/2014   HTN (hypertension) 03/12/2012    Past Medical History:  Diagnosis Date   Diabetes mellitus    Hyperlipidemia    Hypertension     Current Outpatient Medications  Medication Sig Dispense Refill   atorvastatin (LIPITOR) 10 MG tablet Take 1 tablet (10 mg total) by mouth daily. 90 tablet 1   blood glucose meter kit and supplies Per insurance preference. Test blood glucose three times daily as directed. Dx E11.65, Z79.4 1 each 11   Dulaglutide (TRULICITY) 1.5 NO/0.3BC SOPN Inject 1.5 mg into the skin once a week. 4 pen 5   glimepiride (AMARYL) 2 MG tablet Take 1 tablet (2 mg total) by mouth daily before breakfast. 30 tablet 3   Insulin Pen Needle (CAREFINE PEN NEEDLES) 32G X 4 MM MISC Use 4x a day 300 each 3   Lancets (ONETOUCH DELICA PLUS [id number]) MISC USE   TO CHECK GLUCOSE THREE TIMES DAILY 100 each 0   lisinopril (ZESTRIL) 5 MG tablet Take 1 tablet by mouth once daily 90 tablet 1   metFORMIN (GLUCOPHAGE) 1000 MG tablet TAKE 1 TABLET BY MOUTH TWICE DAILY WITH A MEAL 180 tablet  1   ONETOUCH VERIO test strip USE  STRIP TO CHECK GLUCOSE THREE TIMES DAILY 100 each 0   insulin degludec (TRESIBA FLEXTOUCH) 200 UNIT/ML FlexTouch Pen Inject 70 Units into the skin at bedtime. (Patient not taking: Reported on 06/04/2020) 15 mL 3   No current facility-administered medications for this visit.    No Known Allergies  Social History   Socioeconomic History   Marital status:  Single    Spouse name: Not on file   Number of children: 2   Years of education: Not on file   Highest education level: Not on file  Occupational History   Not on file  Tobacco Use   Smoking status: Never Smoker   Smokeless tobacco: Never Used  Vaping Use   Vaping Use: Never used  Substance and Sexual Activity   Alcohol use: No   Drug use: No   Sexual activity: Yes    Birth control/protection: None  Other Topics Concern   Not on file  Social History Narrative   Not on file   Social Determinants of Health   Financial Resource Strain:    Difficulty of Paying Living Expenses: Not on file  Food Insecurity:    Worried About [place] in the Last Year: Not on file   Ran Out of Food in the Last Year: Not on file  Transportation Needs:    Lack of Transportation (Medical): Not on file   Lack of Transportation (Non-Medical): Not on file  Physical Activity:    Days of Exercise per Week: Not on file   Minutes of Exercise per Session: Not on file  Stress:    Feeling of Stress : Not on file  Social Connections:    Frequency of Communication with Friends and Family: Not on file   Frequency of Social Gatherings with Friends and Family: Not on file   Attends Religious Services: Not on file   Active Member of Clubs or Organizations: Not on file   Attends Archivist Meetings: Not on file   Marital Status: Not on file  Intimate Partner Violence:    Fear of Current or Ex-Partner: Not on file   Emotionally Abused: Not on file   Physically Abused: Not on file   Sexually Abused: Not on file    Review of Systems  Constitutional: Negative.  Negative for chills and fever.  HENT: Positive for congestion. Negative for sore throat.   Respiratory: Positive for cough. Negative for sputum production and shortness of breath.   Cardiovascular: Negative.  Negative for chest pain and palpitations.  Gastrointestinal: Negative.  Negative for  abdominal pain, diarrhea, nausea and vomiting.  Genitourinary: Negative.  Negative for dysuria.  Skin: Negative.  Negative for rash.  Neurological: Negative.  Negative for dizziness and headaches.  All other systems reviewed and are negative.   Objective  Alert and oriented x3 in no apparent respiratory distress. Vitals as reported by the patient: There were no vitals filed for this visit.  There are no diagnoses linked to this encounter. Nicole Villanueva was seen today for cough, nasal congestion and medication management.  Diagnoses and all orders for this visit:  Viral respiratory infection  Cough -     benzonatate (TESSALON) 200 MG capsule; Take 1 capsule (200 mg total) by mouth 2 (two) times daily as needed for cough. -     promethazine-dextromethorphan (PROMETHAZINE-DM) 6.25-15 MG/5ML syrup; Take 5 mLs by mouth 4 (four) times daily as needed for cough.  Flu-like symptoms  Uncontrolled type 2 diabetes mellitus with hyperglycemia, with long-term current use of insulin (HCC) -     insulin glargine (LANTUS SOLOSTAR) 100 UNIT/ML Solostar Pen; Inject 70 Units into the skin daily.     I discussed the assessment and treatment plan with the patient. The patient was provided an opportunity to ask questions and all were answered. The patient agreed with the plan and demonstrated an understanding of the instructions.   The patient was advised to call back or seek an in-person evaluation if the symptoms worsen or if the condition fails to improve as anticipated.  I provided 20 minutes of non-face-to-face time during this encounter.  Horald Pollen, MD  Primary Care at [facility]

## 2020-06-20 LAB — HM DIABETES EYE EXAM

## 2020-08-02 ENCOUNTER — Other Ambulatory Visit: Payer: Self-pay

## 2020-08-02 ENCOUNTER — Encounter: Payer: Self-pay | Admitting: Emergency Medicine

## 2020-08-02 ENCOUNTER — Ambulatory Visit: Payer: 59 | Admitting: Emergency Medicine

## 2020-08-02 VITALS — BP 106/70 | HR 65 | Temp 97.4°F | Resp 16 | Ht 61.0 in | Wt 148.0 lb

## 2020-08-02 DIAGNOSIS — I152 Hypertension secondary to endocrine disorders: Secondary | ICD-10-CM

## 2020-08-02 DIAGNOSIS — I10 Essential (primary) hypertension: Secondary | ICD-10-CM | POA: Diagnosis not present

## 2020-08-02 DIAGNOSIS — Z794 Long term (current) use of insulin: Secondary | ICD-10-CM

## 2020-08-02 DIAGNOSIS — E785 Hyperlipidemia, unspecified: Secondary | ICD-10-CM | POA: Diagnosis not present

## 2020-08-02 DIAGNOSIS — E1169 Type 2 diabetes mellitus with other specified complication: Secondary | ICD-10-CM

## 2020-08-02 DIAGNOSIS — E1159 Type 2 diabetes mellitus with other circulatory complications: Secondary | ICD-10-CM

## 2020-08-02 DIAGNOSIS — E1165 Type 2 diabetes mellitus with hyperglycemia: Secondary | ICD-10-CM

## 2020-08-02 LAB — POCT GLYCOSYLATED HEMOGLOBIN (HGB A1C): Hemoglobin A1C: 9.4 % — AB (ref 4.0–5.6)

## 2020-08-02 LAB — GLUCOSE, POCT (MANUAL RESULT ENTRY): POC Glucose: 276 mg/dl — AB (ref 70–99)

## 2020-08-02 MED ORDER — ROSUVASTATIN CALCIUM 10 MG PO TABS: 10.0000 mg | ORAL_TABLET | Freq: Every day | ORAL | 3 refills | Status: DC

## 2020-08-02 MED ORDER — TRULICITY 1.5 MG/0.5ML ~~LOC~~ SOAJ: 1.5000 mg | SUBCUTANEOUS | 3 refills | Status: DC

## 2020-08-02 NOTE — Assessment & Plan Note (Signed)
Uncontrolled diabetes with hemoglobin A1c of 9.4.  Has been off Trulicity for 3 weeks. Continue insulin regimen and start Trulicity.  Continue Metformin. Endocrinology referral placed. Diet and nutrition discussed. We will replace atorvastatin with rosuvastatin 10 mg daily. Continue lisinopril 5 mg daily. Follow-up in 3 months.

## 2020-08-02 NOTE — Patient Instructions (Addendum)
   If you have lab work done today you will be contacted with your lab results within the next 2 weeks.  If you have not heard from us then please contact us. The fastest way to get your results is to register for My Chart.   IF you received an x-ray today, you will receive an invoice from [facility]. Please contact [facility] at [phone] with questions or concerns regarding your invoice.   IF you received labwork today, you will receive an invoice from [facility]. Please contact [facility] at [phone] with questions or concerns regarding your invoice.   Our billing staff will not be able to assist you with questions regarding bills from these companies.  You will be contacted with the lab results as soon as they are available. The fastest way to get your results is to activate your My Chart account. Instructions are located on the last page of this paperwork. If you have not heard from us regarding the results in 2 weeks, please contact this office.     Diabetes mellitus y nutricin, en adultos Diabetes Mellitus and Nutrition, Adult Si sufre de diabetes (diabetes mellitus), es muy importante tener hbitos alimenticios saludables debido a que sus niveles de azcar en la sangre (glucosa) se ven afectados en gran medida por lo que come y bebe. [place]er alimentos saludables en las cantidades adecuadas, aproximadamente a la misma hora todos los das, lo ayudar a:  Controlar la glucemia.  Disminuir el riesgo de sufrir una enfermedad cardaca.  Mejorar la presin arterial.  Alcanzar o mantener un peso saludable. Todas las personas que sufren de diabetes son diferentes y cada una tiene necesidades diferentes en cuanto a un plan de alimentacin. El mdico puede recomendarle que trabaje con un especialista en dietas y nutricin (nutricionista) para elaborar el mejor plan para usted. Su plan de alimentacin puede variar segn factores como:  Las caloras que  necesita.  Los medicamentos que toma.  Su peso.  Sus niveles de glucemia, presin arterial y colesterol.  Su nivel de actividad.  Otras afecciones que tenga, como enfermedades cardacas o renales. Cmo me afectan los carbohidratos? Los carbohidratos, o hidratos de carbono, afectan su nivel de glucemia ms que cualquier otro tipo de alimento. La ingesta de carbohidratos naturalmente aumenta la cantidad de glucosa en la sangre. El recuento de carbohidratos es un mtodo destinado a llevar un registro de la cantidad de carbohidratos que se consumen. El recuento de carbohidratos es importante para mantener la glucemia a un nivel saludable, especialmente si utiliza insulina o toma determinados medicamentos por va oral para la diabetes. Es importante conocer la cantidad de carbohidratos que se pueden ingerir en cada comida sin correr ningn riesgo. Esto es diferente en cada persona. Su nutricionista puede ayudarlo a calcular la cantidad de carbohidratos que debe ingerir en cada comida y en cada refrigerio. Entre los alimentos que contienen carbohidratos, se incluyen:  Pan, cereal, arroz, pastas y galletas.  Papas y maz.  Guisantes, frijoles y lentejas.  Leche y yogur.  Frutas y jugo.  Postres, como pasteles, galletas, helado y caramelos. Cmo me afecta el alcohol? El alcohol puede provocar disminuciones sbitas de la glucemia (hipoglucemia), especialmente si utiliza insulina o toma determinados medicamentos por va oral para la diabetes. La hipoglucemia es una afeccin potencialmente mortal. Los sntomas de la hipoglucemia (somnolencia, mareos y confusin) son similares a los sntomas de haber consumido demasiado alcohol. Si el mdico afirma que el alcohol es seguro para usted, siga estas pautas:    Limite el consumo de alcohol a no ms de 1medida por da si es mujer y no est embarazada, y a 2medidas si es hombre. Una medida equivale a 12oz (355ml) de cerveza, 5oz (148ml) de vino o  1oz (44ml) de bebidas alcohlicas de alta graduacin.  No beba con el estmago vaco.  Mantngase hidratado bebiendo agua, refrescos dietticos o t helado sin azcar.  Tenga en cuenta que los refrescos comunes, los jugos y otras bebida para mezclar pueden contener mucha azcar y se deben contar como carbohidratos. Cules son algunos consejos para seguir este plan?  Leer las etiquetas de los alimentos  Comience por leer el tamao de la porcin en la "Informacin nutricional" en las etiquetas de los alimentos envasados y las bebidas. La cantidad de caloras, carbohidratos, grasas y otros nutrientes mencionados en la etiqueta se basan en una porcin del alimento. Muchos alimentos contienen ms de una porcin por envase.  Verifique la cantidad total de gramos (g) de carbohidratos totales en una porcin. Puede calcular la cantidad de porciones de carbohidratos al dividir el total de carbohidratos por 15. Por ejemplo, si un alimento tiene un total de 30g de carbohidratos, equivale a 2 porciones de carbohidratos.  Verifique la cantidad de gramos (g) de grasas saturadas y grasas trans en una porcin. Escoja alimentos que no contengan grasa o que tengan un bajo contenido.  Verifique la cantidad de miligramos (mg) de sal (sodio) en una porcin. La mayora de las personas deben limitar la ingesta de sodio total a menos de 2300mg por da.  Siempre consulte la informacin nutricional de los alimentos etiquetados como "con bajo contenido de grasa" o "sin grasa". Estos alimentos pueden tener un mayor contenido de azcar agregada o carbohidratos refinados, y deben evitarse.  Hable con su nutricionista para identificar sus objetivos diarios en cuanto a los nutrientes mencionados en la etiqueta. Al ir de compras  Evite comprar alimentos procesados, enlatados o precocinados. Estos alimentos tienden a tener una mayor cantidad de grasa, sodio y azcar agregada.  Compre en la zona exterior de la tienda de  comestibles. Esta zona incluye frutas y verduras frescas, granos a granel, carnes frescas y productos lcteos frescos. Al cocinar  Utilice mtodos de coccin a baja temperatura, como hornear, en lugar de mtodos de coccin a alta temperatura, como frer en abundante aceite.  Cocine con aceites saludables, como el aceite de oliva, canola o girasol.  Evite cocinar con manteca, crema o carnes con alto contenido de grasa. Planificacin de las comidas  Coma las comidas y los refrigerios regularmente, preferentemente a la misma hora todos los das. Evite pasar largos perodos de tiempo sin comer.  Consuma alimentos ricos en fibra, como frutas frescas, verduras, frijoles y cereales integrales. Consulte a su nutricionista sobre cuntas porciones de carbohidratos puede consumir en cada comida.  Consuma entre 4 y 6 onzas (oz) de protenas magras por da, como carnes magras, pollo, pescado, huevos o tofu. Una onza de protena magra equivale a: ? 1 onza de carne, pollo o pescado. ? 1huevo. ?  taza de tofu.  Coma algunos alimentos por da que contengan grasas saludables, como aguacates, frutos secos, semillas y pescado. Estilo de vida  Controle su nivel de glucemia con regularidad.  Haga actividad fsica habitualmente como se lo haya indicado el mdico. Esto puede incluir lo siguiente: ? 150minutos semanales de ejercicio de intensidad moderada o alta. Esto podra incluir caminatas dinmicas, ciclismo o gimnasia acutica. ? Realizar ejercicios de elongacin y de fortalecimiento, como yoga o levantamiento   de pesas, por lo menos 2veces por semana.  Tome los medicamentos como se lo haya indicado el mdico.  No consuma ningn producto que contenga nicotina o tabaco, como cigarrillos y cigarrillos electrnicos. Si necesita ayuda para dejar de fumar, consulte al mdico.  Trabaje con un asesor o instructor en diabetes para identificar estrategias para controlar el estrs y cualquier desafo emocional  y social. Preguntas para hacerle al mdico  Es necesario que consulte a un instructor en el cuidado de la diabetes?  Es necesario que me rena con un nutricionista?  A qu nmero puedo llamar si tengo preguntas?  Cules son los mejores momentos para controlar la glucemia? Dnde encontrar ms informacin:  Asociacin Estadounidense de la Diabetes (American Diabetes Association): diabetes.org  Academia de Nutricin y Diettica (Academy of Nutrition and Dietetics): www.eatright.org  Instituto Nacional de la Diabetes y las Enfermedades Digestivas y Renales (National Institute of Diabetes and Digestive and Kidney Diseases, NIH): www.niddk.nih.gov Resumen  Un plan de alimentacin saludable lo ayudar a controlar la glucemia y mantener un estilo de vida saludable.  Trabajar con un especialista en dietas y nutricin (nutricionista) puede ayudarlo a elaborar el mejor plan de alimentacin para usted.  Tenga en cuenta que los carbohidratos (hidratos de carbono) y el alcohol tienen efectos inmediatos en sus niveles de glucemia. Es importante contar los carbohidratos que ingiere y consumir alcohol con prudencia. Esta informacin no tiene como fin reemplazar el consejo del mdico. Asegrese de hacerle al mdico cualquier pregunta que tenga. Document Revised: 04/21/2017 Document Reviewed: 12/01/2016 Elsevier Patient Education  2020 [employer]  

## 2020-08-02 NOTE — Progress Notes (Signed)
Nicole Villanueva 54 y.o.   Chief Complaint  Patient presents with  . Referral  . Diabetes    Per patient request and Medication refill Trulicity    HISTORY OF PRESENT ILLNESS: This is a 54 y.o. female with history of diabetes here for follow-up and medication refill.  Also needs referral to endocrinology. Used to see Dr. Pamella Pert.  First office visit with me and wants to establish care. Insulin-dependent diabetic.  Also on Metformin and Trulicity. History of hypertension.  Takes lisinopril 5 mg daily. History of dyslipidemia but atorvastatin given her side effects. No other complaints or medical concerns today.  HPI   Prior to Admission medications   Medication Sig Start Date End Date Taking? Authorizing Provider  atorvastatin (LIPITOR) 10 MG tablet Take 1 tablet (10 mg total) by mouth daily. 12/26/19  Yes Rutherford Guys, MD  Dulaglutide (TRULICITY) 1.5 TK/2.4EC SOPN Inject 1.5 mg into the skin once a week. 01/19/20  Yes Rutherford Guys, MD  glimepiride (AMARYL) 2 MG tablet Take 1 tablet (2 mg total) by mouth daily before breakfast. 04/27/20  Yes Rutherford Guys, MD  insulin glargine (LANTUS SOLOSTAR) 100 UNIT/ML Solostar Pen Inject 70 Units into the skin daily. 06/04/20  Yes Dakota Stangl, Ines Bloomer, MD  lisinopril (ZESTRIL) 5 MG tablet Take 1 tablet by mouth once daily 05/08/20  Yes Rutherford Guys, MD  metFORMIN (GLUCOPHAGE) 1000 MG tablet TAKE 1 TABLET BY MOUTH TWICE DAILY WITH A MEAL 05/08/20  Yes Rutherford Guys, MD  blood glucose meter kit and supplies Per insurance preference. Test blood glucose three times daily as directed. Dx E11.65, Z79.4 09/27/18   Rutherford Guys, MD  Insulin Pen Needle (CAREFINE PEN NEEDLES) 32G X 4 MM MISC Use 4x a day 09/27/18   Rutherford Guys, MD  Lancets (ONETOUCH DELICA PLUS [id number]) MISC USE   TO [place] 01/26/20   Rutherford Guys, MD  [facility] VERIO test strip USE  STRIP TO CHECK GLUCOSE THREE TIMES DAILY 02/21/20    Rutherford Guys, MD    No Known Allergies  Patient Active Problem List   Diagnosis Date Noted  . Cough 10/13/2016  . Uncontrolled type 2 diabetes mellitus with hyperglycemia, with long-term current use of insulin ([place]) 10/09/2016  . Diabetic retinopathy ([place]) 06/13/2016  . Uterovaginal prolapse 11/03/2014  . Prolapse of anterior vaginal wall 11/03/2014  . Prolapse of female pelvic organs 06/09/2014  . Hyperlipidemia 03/29/2014  . Depressive disorder, not elsewhere classified 03/29/2014  . Hot flashes 03/29/2014  . Esophageal reflux 03/29/2014  . Vasovagal syndrome 03/09/2014  . HTN (hypertension) 03/12/2012    Past Medical History:  Diagnosis Date  . Diabetes mellitus   . Hyperlipidemia   . Hypertension     Past Surgical History:  Procedure Laterality Date  . ABDOMINAL HYSTERECTOMY    . CHOLECYSTECTOMY      Social History   Socioeconomic History  . Marital status: Single    Spouse name: Not on file  . Number of children: 2  . Years of education: Not on file  . Highest education level: Not on file  Occupational History  . Not on file  Tobacco Use  . Smoking status: Never Smoker  . Smokeless tobacco: Never Used  Vaping Use  . Vaping Use: Never used  Substance and Sexual Activity  . Alcohol use: No  . Drug use: No  . Sexual activity: Yes    Birth control/protection: None  Other Topics Concern  .  Not on file  Social History Narrative  . Not on file   Social Determinants of Health   Financial Resource Strain: Not on file  Food Insecurity: Not on file  Transportation Needs: Not on file  Physical Activity: Not on file  Stress: Not on file  Social Connections: Not on file  Intimate Partner Violence: Not on file    Family History  Problem Relation Age of Onset  . Diabetes Mother   . Diabetes Father      Review of Systems  Constitutional: Negative.  Negative for chills and fever.  HENT: Negative for congestion and sore throat.   Respiratory:  Negative.  Negative for cough and shortness of breath.   Cardiovascular: Negative.  Negative for chest pain and palpitations.  Gastrointestinal: Negative for abdominal pain, diarrhea, nausea and vomiting.  Genitourinary: Negative.  Negative for dysuria and hematuria.  Skin: Negative.  Negative for rash.  Neurological: Negative.  Negative for dizziness and headaches.  All other systems reviewed and are negative.   Today's Vitals   08/02/20 0830  BP: 106/70  Pulse: 65  Resp: 16  Temp: (!) 97.4 F (36.3 C)  TempSrc: Temporal  SpO2: 94%  Weight: 148 lb (67.1 kg)  Height: '5\' 1"'  (1.549 m)   Body mass index is 27.96 kg/m. Wt Readings from Last 3 Encounters:  08/02/20 148 lb (67.1 kg)  04/27/20 156 lb 12.8 oz (71.1 kg)  01/19/20 154 lb 12.8 oz (70.2 kg)    Physical Exam Vitals reviewed.  Constitutional:      Appearance: Normal appearance.  HENT:     Head: Normocephalic.  Eyes:     Extraocular Movements: Extraocular movements intact.     Conjunctiva/sclera: Conjunctivae normal.     Pupils: Pupils are equal, round, and reactive to light.  Cardiovascular:     Rate and Rhythm: Normal rate and regular rhythm.     Pulses: Normal pulses.     Heart sounds: Normal heart sounds.  Pulmonary:     Effort: Pulmonary effort is normal.     Breath sounds: Normal breath sounds.  Musculoskeletal:        General: Normal range of motion.     Cervical back: Normal range of motion and neck supple.  Skin:    General: Skin is warm and dry.     Capillary Refill: Capillary refill takes less than 2 seconds.  Neurological:     General: No focal deficit present.     Mental Status: She is alert and oriented to person, place, and time.  Psychiatric:        Mood and Affect: Mood normal.        Behavior: Behavior normal.     Results for orders placed or performed in visit on 08/02/20 (from the past 24 hour(s))  POCT glucose (manual entry)     Status: Abnormal   Collection Time: 08/02/20  8:45 AM   Result Value Ref Range   POC Glucose 276 (A) 70 - 99 mg/dl  POCT glycosylated hemoglobin (Hb A1C)     Status: Abnormal   Collection Time: 08/02/20  8:49 AM  Result Value Ref Range   Hemoglobin A1C 9.4 (A) 4.0 - 5.6 %   HbA1c POC (<> result, manual entry)     HbA1c, POC (prediabetic range)     HbA1c, POC (controlled diabetic range)     The 10-year ASCVD risk score Mikey Bussing DC Jr., et al., 2013) is: 4.9%   Values used to calculate the score:  Age: 10 years     Sex: Female     Is Non-Hispanic African American: No     Diabetic: Yes     Tobacco smoker: No     Systolic Blood Pressure: 378 mmHg     Is BP treated: Yes     HDL Cholesterol: 34 mg/dL     Total Cholesterol: 202 mg/dL  ASSESSMENT & PLAN: Uncontrolled type 2 diabetes mellitus with hyperglycemia, with long-term current use of insulin (HCC) Uncontrolled diabetes with hemoglobin A1c of 9.4.  Has been off Trulicity for 3 weeks. Continue insulin regimen and start Trulicity.  Continue Metformin. Endocrinology referral placed. Diet and nutrition discussed. We will replace atorvastatin with rosuvastatin 10 mg daily. Continue lisinopril 5 mg daily. Follow-up in 3 months.  Quaneshia was seen today for referral and diabetes.  Diagnoses and all orders for this visit:  Uncontrolled type 2 diabetes mellitus with hyperglycemia, with long-term current use of insulin ([place]) -     Ambulatory referral to Endocrinology -     POCT glucose (manual entry) -     POCT glycosylated hemoglobin (Hb A1C) -     Dulaglutide (TRULICITY) 1.5 HY/8.5OY SOPN; Inject 1.5 mg into the skin once a week. -     rosuvastatin (CRESTOR) 10 MG tablet; Take 1 tablet (10 mg total) by mouth daily.  Essential hypertension  Dyslipidemia  Hypertension associated with diabetes ([place])  Dyslipidemia associated with type 2 diabetes mellitus [facility])    Patient Instructions       If you have lab work done today you will be contacted with your lab results within the  next 2 weeks.  If you have not heard from [place] then please contact us. The fastest way to get your results is to register for My Chart.   IF you received an x-ray today, you will receive an invoice from [facility] Radiology. Please contact [facility], [facility], [facility] Radiology at [phone] with questions or concerns regarding your invoice.   IF you received labwork today, you will receive an invoice from [place]. Please contact [facility] at [phone] with questions or concerns regarding your invoice.   Our billing staff will not be able to assist you with questions regarding bills from these companies.  You will be contacted with the lab results as soon as they are available. The fastest way to get your results is to activate your My Chart account. Instructions are located on the last page of this paperwork. If you have not heard from [place] regarding the results in 2 weeks, please contact this office.     Diabetes mellitus y nutricin, en adultos Diabetes Mellitus and Nutrition, Adult Si sufre de diabetes (diabetes mellitus), es muy importante tener hbitos alimenticios saludables debido a que sus niveles de Designer, television/film set sangre (glucosa) se ven afectados en gran medida por lo que come y bebe. [place] alimentos saludables en las cantidades [place], aproximadamente a la [employer], [place] ayudar a:  Aeronautical engineer glucemia.  Disminuir el riesgo de sufrir una enfermedad cardaca.  Mejorar la presin arterial.  Science writer o mantener un peso saludable. Todas las personas que sufren de diabetes son diferentes y cada una tiene necesidades diferentes en cuanto a un plan de alimentacin. El mdico puede recomendarle que trabaje con un especialista en dietas y nutricin (nutricionista) para Financial trader plan para usted. Su plan de alimentacin puede variar segn factores como:  Las caloras que necesita.  Los medicamentos que toma.  Su peso.  Sus niveles de glucemia,  presin arterial y  colesterol.  Su nivel de [place].  Otras afecciones que tenga, como enfermedades cardacas o renales. Cmo me afectan los carbohidratos? Los carbohidratos, o hidratos de carbono, afectan su nivel de glucemia ms que cualquier otro tipo de alimento. La ingesta de carbohidratos naturalmente aumenta la cantidad de [employer]. El recuento de carbohidratos es un mtodo destinado a Catering manager un registro de la cantidad de carbohidratos que se consumen. El recuento de carbohidratos es importante para Theatre manager la glucemia a un nivel saludable, especialmente si utiliza insulina o toma determinados medicamentos por va oral para la diabetes. Es importante conocer la cantidad de carbohidratos que se pueden ingerir en cada comida sin correr Engineer, manufacturing. Esto es Psychologist, forensic. Su nutricionista puede ayudarlo a calcular la cantidad de carbohidratos que debe ingerir en cada comida y en cada refrigerio. Entre los alimentos que contienen carbohidratos, se incluyen:  Pan, cereal, arroz, pastas y galletas.  Papas y maz.  Guisantes, frijoles y lentejas.  Leche y Estate agent.  Lambert Mody y [place].  Postres, como pasteles, galletas, helado y caramelos. Cmo me afecta el alcohol? El alcohol puede provocar disminuciones sbitas de la glucemia (hipoglucemia), especialmente si utiliza insulina o toma determinados medicamentos por va oral para la diabetes. La hipoglucemia es una afeccin potencialmente mortal. Los sntomas de la hipoglucemia (somnolencia, mareos y confusin) son similares a los sntomas de haber consumido demasiado alcohol. Si el mdico afirma que el alcohol es seguro para usted, [place] estas pautas:  Limite el consumo de alcohol a no ms de 83mdida por da si es mujer y no est e[place] y a 2105midas si es hombre. Una medida equivale a 12oz (3558mde cerveza, 5oz (148m90me vino o 1oz (44ml59m bebidas alcohlicas de alta graduacin.  No beba con el estmago  vaco.  Mantngase hidratado bebiendo agua, refrescos dietticos o t helado sin azcar.  Tenga en cuenta que los refrescos comunes, los jugos y otras bebida para mezclOptician, dispensingen contener mucha azcar y se deben contar como carbohidratos. Cules son algunos consejos para seguir este plan?  Leer las etiquetas de los alimentos  Comience por leer el tamao de la porcin en la "Informacin nutricional" en las etiquetas de los alimentos envasados y las bebidas. La cantidad de caloras, carbohidratos, grasas y otros nutrientes mencionados en la etiqueta se basan en una porcin del alimento. Muchos alimentos contienen ms de una porcin por envase.  Verifique la cantidad total de gramos (g) de carbohidratos totales en una porcin. Puede calcular la cantidad de porciones de carbohidratos al dividir el total de carbohidratos por 15. Por ejemplo, si un alimento tiene un total de 30g de carbohidratos, equivale a 2 porciones de carbohidratos.  Verifique la cantidad de gramos (g) de grasas saturadas y grasas trans en una porcin. Escoja alimentos que no contengan grasa o que tengan un bajo contenido.  Verifique la cantidad de miligramos (mg) de sal (sodio) en una porcin. La mayorState Farmas personas deben limitar la ingesta de sodio total a menos de 2300mg 33mda.  STraining and development officermpre consulte la informacin nutricional de los alimentos etiquetados como "con bajo contenido de grasa" o "sin grasa". Estos alimentos pueden tener un mayor contenido de azcar Location managerada o carbohidratos refinados, y deben evitarse.  Hable con su nutricionista para identificar sus objetivos diarios en cuanto a los nutrientes mencionados en la etiqueta. Al ir de compras  Evite comprar alimentos procesados, enlatados o precocinados. Estos alimentos tienden a tener Special educational needs teacher cantidad de grasa,[place]  y azcar agregada.  Compre en la zona exterior de la tienda de comestibles. Esta zona incluye frutas y verduras frescas, granos a granel, carnes  frescas y productos lcteos frescos. Al cocinar  Utilice mtodos de coccin a baja temperatura, como hornear, en lugar de mtodos de coccin a alta temperatura, como frer en abundante aceite.  Cocine con aceites saludables, como el aceite de [place], canola o [place].  Evite cocinar con manteca, crema o carnes con alto contenido de grasa. Planificacin de las comidas  Coma las comidas y los refrigerios regularmente, preferentemente a la misma hora todos [place]. Evite pasar largos perodos de tiempo sin comer.  Consuma alimentos ricos en fibra, como frutas frescas, verduras, frijoles y cereales integrales. Consulte a su nutricionista sobre cuntas porciones de carbohidratos puede consumir en cada comida.  Consuma entre 4 y 6 onzas (oz) de protenas magras por da, como carnes [place], pollo, pescado, huevos o tofu. Una onza de protena magra equivale a: ? 1 onza de carne, pollo o pescado. ? 1huevo. ?  taza de tofu.  Coma algunos alimentos por da que contengan grasas saludables, como aguacates, frutos secos, semillas y pescado. Estilo de vida  Controle su nivel de glucemia con regularidad.  Haga actividad fsica habitualmente como se lo haya indicado el mdico. Esto puede incluir lo siguiente: ? 195mnutos semanales de ejercicio de intensidad moderada o alta. Esto podra incluir caminatas dinmicas, ciclismo o gimnasia acutica. ? Realizar ejercicios de elongacin y de fortalecimiento, como yoga o levantamiento de pesas, por lo menos 2veces por semana.  Tome los mTenneco Incse lo haya indicado el mdico.  No consuma ningn producto que contenga nicotina o tabaco, como cigarrillos y cPsychologist, sport and exercise Si necesita ayuda para dejar de fumar, consulte al mHess Corporationcon un asesor o instructor en diabetes para identificar estrategias para controlar el estrs y cualquier desafo emocional y social. Preguntas para hacerle al mdico  Es necesario que consulte a uAdministrator, sportsen el cuidado de la diabetes?  Es necesario que me rena con un nutricionista?  A qu nmero puedo llamar si tengo preguntas?  Cules son los mejores momentos para controlar la glucemia? Dnde encontrar ms informacin:  Asociacin Estadounidense de la Diabetes (American Diabetes Association): diabetes.org  Academia de Nutricin y DInformation systems manager(Academy of Nutrition and Dietetics): www.eatright.oBig RunDiabetes y las Enfermedades Digestivas y Renales ([facility]star Paul[facility] Hospitalof Diabetes and Digestive and Kidney Diseases, NIH): wDesMoinesFuneral.dkResumen  Un plan de alimentacin saludable lo ayudar a cAeronautical engineerglucemia y mTheatre managerun estilo de vida saludable.  Trabajar con un especialista en dietas y nutricin (nutricionista) puede ayudarlo a eInsurance claims handlerde alimentacin para usted.  Tenga en cuenta que los carbohidratos (hidratos de carbono) y el alcohol tienen efectos inmediatos en sus niveles de glucemia. Es importante contar los carbohidratos que ingiere y consumir alcohol con prudencia. Esta informacin no tiene cMarine scientistel consejo del mdico. Asegrese de hacerle al mdico cualquier pregunta que tenga. Document Revised: 04/21/2017 Document Reviewed: 12/01/2016 Elsevier Patient Education  2020 Elsevier Inc.      MAgustina Caroli MD Urgent MColetaGroup

## 2020-09-05 ENCOUNTER — Encounter: Payer: Self-pay | Admitting: Emergency Medicine

## 2020-09-05 ENCOUNTER — Other Ambulatory Visit: Payer: Self-pay

## 2020-09-05 ENCOUNTER — Ambulatory Visit: Payer: 59 | Admitting: Emergency Medicine

## 2020-09-05 VITALS — BP 104/67 | HR 76 | Temp 97.7°F | Resp 16 | Ht 61.0 in | Wt 154.0 lb

## 2020-09-05 DIAGNOSIS — Z23 Encounter for immunization: Secondary | ICD-10-CM

## 2020-09-05 DIAGNOSIS — Z1211 Encounter for screening for malignant neoplasm of colon: Secondary | ICD-10-CM

## 2020-09-05 DIAGNOSIS — I152 Hypertension secondary to endocrine disorders: Secondary | ICD-10-CM

## 2020-09-05 DIAGNOSIS — E785 Hyperlipidemia, unspecified: Secondary | ICD-10-CM

## 2020-09-05 DIAGNOSIS — E1159 Type 2 diabetes mellitus with other circulatory complications: Secondary | ICD-10-CM | POA: Diagnosis not present

## 2020-09-05 DIAGNOSIS — E1169 Type 2 diabetes mellitus with other specified complication: Secondary | ICD-10-CM | POA: Diagnosis not present

## 2020-09-05 MED ORDER — ONETOUCH VERIO VI STRP: ORAL_STRIP | 0 refills | Status: DC

## 2020-09-05 MED ORDER — ONETOUCH DELICA PLUS LANCET33G MISC: 0 refills | Status: AC

## 2020-09-05 MED ORDER — CAREFINE PEN NEEDLES 32G X 4 MM MISC: 3 refills | Status: AC

## 2020-09-05 NOTE — Patient Instructions (Addendum)
   If you have lab work done today you will be contacted with your lab results within the next 2 weeks.  If you have not heard from us then please contact us. The fastest way to get your results is to register for My Chart.   IF you received an x-ray today, you will receive an invoice from [facility]. Please contact  Radiology at [phone] with questions or concerns regarding your invoice.   IF you received labwork today, you will receive an invoice from [facility]. Please contact [facility] at [phone] with questions or concerns regarding your invoice.   Our billing staff will not be able to assist you with questions regarding bills from these companies.  You will be contacted with the lab results as soon as they are available. The fastest way to get your results is to activate your My Chart account. Instructions are located on the last page of this paperwork. If you have not heard from us regarding the results in 2 weeks, please contact this office.     Diabetes mellitus y nutricin, en adultos Diabetes Mellitus and Nutrition, Adult Si sufre de diabetes, o diabetes mellitus, es muy importante tener hbitos alimenticios saludables debido a que sus niveles de azcar en la sangre (glucosa) se ven afectados en gran medida por lo que come y bebe. [place] alimentos saludables en las cantidades correctas, aproximadamente a la misma hora todos los das, lo ayudar a:  Controlar la glucemia.  Disminuir el riesgo de sufrir una enfermedad cardaca.  Mejorar la presin arterial.  Alcanzar o mantener un peso saludable. Qu puede afectar mi plan de alimentacin? Todas las personas que sufren de diabetes son diferentes y cada una tiene necesidades diferentes en cuanto a un plan de alimentacin. El mdico puede recomendarle que trabaje con un nutricionista para elaborar el mejor plan para usted. Su plan de alimentacin puede variar segn factores como:  Las caloras que  necesita.  Los medicamentos que toma.  Su peso.  Sus niveles de glucemia, presin arterial y colesterol.  Su nivel de actividad.  Otras afecciones que tenga, como enfermedades cardacas o renales. Cmo me afectan los carbohidratos? Los carbohidratos, o hidratos de carbono, afectan su nivel de glucemia ms que cualquier otro tipo de alimento. La ingesta de carbohidratos naturalmente aumenta la cantidad de glucosa en la sangre. El recuento de carbohidratos es un mtodo destinado a llevar un registro de la cantidad de carbohidratos que se consumen. El recuento de carbohidratos es importante para mantener la glucemia a un nivel saludable, especialmente si utiliza insulina o toma determinados medicamentos por va oral para la diabetes. Es importante conocer la cantidad de carbohidratos que se pueden ingerir en cada comida sin correr ningn riesgo. Esto es diferente en cada persona. Su nutricionista puede ayudarlo a calcular la cantidad de carbohidratos que debe ingerir en cada comida y en cada refrigerio. Cmo me afecta el alcohol? El alcohol puede provocar disminuciones sbitas de la glucemia (hipoglucemia), especialmente si utiliza insulina o toma determinados medicamentos por va oral para la diabetes. La hipoglucemia es una afeccin potencialmente mortal. Los sntomas de la hipoglucemia, como somnolencia, mareos y confusin, son similares a los sntomas de haber consumido demasiado alcohol.  No beba alcohol si: ? Su mdico le indica no hacerlo. ? Est embarazada, puede estar embarazada o est tratando de quedar embarazada.  Si bebe alcohol: ? No beba con el estmago vaco. ? Limite la cantidad que bebe:  De 0 a 1 medida por da para las   mujeres.  De 0 a 2 medidas por da para los hombres. ? Est atento a la cantidad de alcohol que hay en las bebidas que toma. En [place], una medida equivale a una botella de cerveza de 12oz (355ml), un vaso de vino de 5oz (148ml) o un vaso  de una bebida alcohlica de alta graduacin de 1oz (44ml). ? Mantngase hidratado bebiendo agua, refrescos dietticos o t helado sin azcar.  Tenga en cuenta que los refrescos comunes, los jugos y otras bebida para mezclar pueden contener mucha azcar y se deben contar como carbohidratos. Consejos para seguir este plan Leer las etiquetas de los alimentos  Comience por leer el tamao de la porcin en la "Informacin nutricional" en las etiquetas de los alimentos envasados y las bebidas. La cantidad de caloras, carbohidratos, grasas y otros nutrientes mencionados en la etiqueta se basan en una porcin del alimento. Muchos alimentos contienen ms de una porcin por envase.  Verifique la cantidad total de gramos (g) de carbohidratos totales en una porcin. Puede calcular la cantidad de porciones de carbohidratos al dividir el total de carbohidratos por 15. Por ejemplo, si un alimento tiene un total de 30g de carbohidratos totales por porcin, equivale a 2 porciones de carbohidratos.  Verifique la cantidad de gramos (g) de grasas saturadas y grasas trans de una porcin. Escoja alimentos que no contengan estas grasas o que su contenido de estas sea bajo.  Verifique la cantidad de miligramos (mg) de sal (sodio) en una porcin. La mayora de las personas deben limitar la ingesta de sodio total a menos de 2300mg por da.  Siempre consulte la informacin nutricional de los alimentos etiquetados como "con bajo contenido de grasa" o "sin grasa". Estos alimentos pueden tener un mayor contenido de azcar agregada o carbohidratos refinados, y deben evitarse.  Hable con su nutricionista para identificar sus objetivos diarios en cuanto a los nutrientes mencionados en la etiqueta. Al ir de compras  Evite comprar alimentos procesados, enlatados o precocidos. Estos alimentos tienden a tener una mayor cantidad de grasa, sodio y azcar agregada.  Compre en la zona exterior de la tienda de comestibles. Esta es  la zona donde se encuentran con mayor frecuencia las frutas y las verduras frescas, los cereales a granel, las carnes frescas y los productos lcteos frescos. Al cocinar  Utilice mtodos de coccin a baja temperatura, como hornear, en lugar de mtodos de coccin a alta temperatura, como frer en abundante aceite.  Cocine con aceites saludables, como el aceite de oliva, canola o girasol.  Evite cocinar con manteca, crema o carnes con alto contenido de grasa. Planificacin de las comidas  Coma las comidas y los refrigerios regularmente, preferentemente a la misma hora todos los das. Evite pasar largos perodos de tiempo sin comer.  Consuma alimentos ricos en fibra, como frutas frescas, verduras, frijoles y cereales integrales. Consulte a su nutricionista sobre cuntas porciones de carbohidratos puede consumir en cada comida.  Consuma entre 4 y 6 onzas (entre 112 y 168g) de protenas magras por da, como carnes magras, pollo, pescado, huevos o tofu. Una onza (oz) de protena magra equivale a: ? 1 onza (28g) de carne, pollo o pescado. ? 1huevo. ?  de taza (62 g) de tofu.  Coma algunos alimentos por da que contengan grasas saludables, como aguacates, frutos secos, semillas y pescado.   Qu alimentos debo comer? Frutas Bayas. Manzanas. Naranjas. Duraznos. Damascos. Ciruelas. Uvas. Mango. Papaya. Granada. Kiwi. Cerezas. Verduras Lechuga. Espinaca. Verduras de hoja verde, que incluyen   col rizada, acelga, hojas de berza y de mostaza. Remolachas. Coliflor. Repollo. Brcoli. Zanahorias. Judas verdes. Tomates. Pimientos. Cebollas. Pepinos. Coles de Bruselas. Granos Granos integrales, como panes, galletas, tortillas, cereales y pastas de salvado o integrales. Avena sin azcar. Quinua. Arroz integral o salvaje. Carnes y otras protenas Mariscos. Carne de ave sin piel. Cortes magros de ave y carne de res. Tofu. Frutos secos. Semillas. Lcteos Productos lcteos sin grasa o con bajo contenido de  grasa, como leche, yogur y queso. Es posible que los productos que se enumeran ms arriba no constituyan una lista completa de los alimentos y las bebidas que puede tomar. Consulte a un nutricionista para obtener ms informacin. Qu alimentos debo evitar? Frutas Frutas enlatadas al almbar. Verduras Verduras enlatadas. Verduras congeladas con mantequilla o salsa de crema. Granos Productos elaborados con harina y harina blanca refinada, como panes, pastas, bocadillos y cereales. Evite todos los alimentos procesados. Carnes y otras protenas Cortes de carne con alto contenido de grasa. Carne de ave con piel. Carnes empanizadas o fritas. Carne procesada. Evite las grasas saturadas. Lcteos Yogur, queso o leche enteros. Bebidas Bebidas azucaradas, como gaseosas o t helado. Es posible que los productos que se enumeran ms arriba no constituyan una lista completa de los alimentos y las bebidas que debe evitar. Consulte a un nutricionista para obtener ms informacin. Preguntas para hacerle al mdico  Es necesario que me rena con un instructor en el cuidado de la diabetes?  Es necesario que me rena con un nutricionista?  A qu nmero puedo llamar si tengo preguntas?  Cules son los mejores momentos para controlar la glucemia? Dnde encontrar ms informacin:  Asociacin Estadounidense de la Diabetes (American Diabetes Association): diabetes.org  Academy of Nutrition and Dietetics (Academia de Nutricin y Diettica): www.eatright.org  National Institute of Diabetes and Digestive and Kidney Diseases (Instituto Nacional de la Diabetes y las Enfermedades Digestivas y Renales): www.niddk.nih.gov  Association of Diabetes Care and Education Specialists (Asociacin de Especialistas en Atencin y Educacin sobre la Diabetes): www.diabeteseducator.org Resumen  Es importante tener hbitos alimenticios saludables debido a que sus niveles de azcar en la sangre (glucosa) se ven afectados en  gran medida por lo que come y bebe.  Un plan de alimentacin saludable lo ayudar a controlar la glucemia y mantener un estilo de vida saludable.  El mdico puede recomendarle que trabaje con un nutricionista para elaborar el mejor plan para usted.  Tenga en cuenta que los carbohidratos (hidratos de carbono) y el alcohol tienen efectos inmediatos en sus niveles de glucemia. Es importante contar los carbohidratos que ingiere y consumir alcohol con prudencia. Esta informacin no tiene como fin reemplazar el consejo del mdico. Asegrese de hacerle al mdico cualquier pregunta que tenga. Document Revised: 09/15/2019 Document Reviewed: 09/15/2019 Elsevier Patient Education  2021 [employer].  

## 2020-09-05 NOTE — Progress Notes (Signed)
Nicole Villanueva 55 y.o.   Chief Complaint  Patient presents with  . Transitions Of Care  . Diabetes    HISTORY OF PRESENT ILLNESS: This is a 55 y.o. female here for follow-up from visit on 08/02/2020 when she established care with me. Diabetic insulin-dependent also on Trulicity, metformin and glimepiride. Had episode of very high blood sugar at home about 1 week ago.  Was able to handle it using short acting insulin.  Doing well today. Has appointment to see endocrinologist on February 10th. Today has no complaints or medical concerns.  HPI   Prior to Admission medications   Medication Sig Start Date End Date Taking? Authorizing Provider  Dulaglutide (TRULICITY) 1.5 IO/2.7OJ SOPN Inject 1.5 mg into the skin once a week. 08/02/20  Yes Avalon Coppinger, Ines Bloomer, MD  glimepiride (AMARYL) 2 MG tablet Take 1 tablet (2 mg total) by mouth daily before breakfast. 04/27/20  Yes Jacelyn Pi, Irma M, MD  insulin glargine (LANTUS SOLOSTAR) 100 UNIT/ML Solostar Pen Inject 70 Units into the skin daily. 06/04/20  Yes Sarit Sparano, Ines Bloomer, MD  Lancets (ONETOUCH DELICA PLUS [id number]) [place] USE   TO CHECK GLUCOSE THREE TIMES DAILY 01/26/20  Yes Jacelyn Pi, Irma M, MD  lisinopril (ZESTRIL) 5 MG tablet Take 1 tablet by mouth once daily 05/08/20  Yes Jacelyn Pi, Lilia Argue, MD  metFORMIN (GLUCOPHAGE) 1000 MG tablet TAKE 1 TABLET BY MOUTH TWICE DAILY WITH A MEAL 05/08/20  Yes Jacelyn Pi, Irma M, MD  rosuvastatin (CRESTOR) 10 MG tablet Take 1 tablet (10 mg total) by mouth daily. 08/02/20  Yes Chinmayi Rumer, Ines Bloomer, MD  blood glucose meter kit and supplies Per insurance preference. Test blood glucose three times daily as directed. Dx E11.65, Z79.4 09/27/18   Jacelyn Pi, Lilia Argue, MD  Insulin Pen Needle (CAREFINE PEN NEEDLES) 32G X 4 MM MISC Use 4x a day 09/27/18   Jacelyn Pi, Lilia Argue, MD  [facility] VERIO test strip USE  STRIP TO CHECK GLUCOSE THREE TIMES DAILY 02/21/20   Jacelyn Pi, Lilia Argue, MD    No  Known Allergies  Patient Active Problem List   Diagnosis Date Noted  . Cough 10/13/2016  . Uncontrolled type 2 diabetes mellitus with hyperglycemia, with long-term current use of insulin ([place]) 10/09/2016  . Diabetic retinopathy ([place]) 06/13/2016  . Uterovaginal prolapse 11/03/2014  . Prolapse of anterior vaginal wall 11/03/2014  . Prolapse of female pelvic organs 06/09/2014  . Hyperlipidemia 03/29/2014  . Depressive disorder, not elsewhere classified 03/29/2014  . Hot flashes 03/29/2014  . Esophageal reflux 03/29/2014  . Vasovagal syndrome 03/09/2014  . HTN (hypertension) 03/12/2012    Past Medical History:  Diagnosis Date  . Diabetes mellitus   . Hyperlipidemia   . Hypertension     Past Surgical History:  Procedure Laterality Date  . ABDOMINAL HYSTERECTOMY    . CHOLECYSTECTOMY      Social History   Socioeconomic History  . Marital status: Single    Spouse name: Not on file  . Number of children: 2  . Years of education: Not on file  . Highest education level: Not on file  Occupational History  . Not on file  Tobacco Use  . Smoking status: Never Smoker  . Smokeless tobacco: Never Used  Vaping Use  . Vaping Use: Never used  Substance and Sexual Activity  . Alcohol use: No  . Drug use: No  . Sexual activity: Yes    Birth control/protection: None  Other Topics Concern  . Not  on file  Social History Narrative  . Not on file   Social Determinants of Health   Financial Resource Strain: Not on file  Food Insecurity: Not on file  Transportation Needs: Not on file  Physical Activity: Not on file  Stress: Not on file  Social Connections: Not on file  Intimate Partner Violence: Not on file    Family History  Problem Relation Age of Onset  . Diabetes Mother   . Diabetes Father      Review of Systems  Constitutional: Negative.  Negative for chills and fever.  HENT: Negative.  Negative for congestion and sore throat.   Respiratory: Negative.  Negative for  shortness of breath.   Cardiovascular: Negative.  Negative for chest pain and palpitations.  Gastrointestinal: Negative.  Negative for abdominal pain, diarrhea, nausea and vomiting.  Genitourinary: Negative.  Negative for dysuria and hematuria.  Skin: Negative.  Negative for rash.  Neurological: Negative.  Negative for dizziness and headaches.  All other systems reviewed and are negative.    Today's Vitals   09/05/20 1653  BP: 104/67  Pulse: 76  Resp: 16  Temp: 97.7 F (36.5 C)  TempSrc: Temporal  SpO2: 93%  Weight: 154 lb (69.9 kg)  Height: '5\' 1"'  (1.549 m)   Body mass index is 29.1 kg/m. Wt Readings from Last 3 Encounters:  09/05/20 154 lb (69.9 kg)  08/02/20 148 lb (67.1 kg)  04/27/20 156 lb 12.8 oz (71.1 kg)     Physical Exam Vitals reviewed.  Constitutional:      Appearance: Normal appearance.  HENT:     Head: Normocephalic.  Eyes:     Extraocular Movements: Extraocular movements intact.     Pupils: Pupils are equal, round, and reactive to light.  Cardiovascular:     Rate and Rhythm: Normal rate and regular rhythm.     Pulses: Normal pulses.     Heart sounds: Normal heart sounds.  Pulmonary:     Effort: Pulmonary effort is normal.     Breath sounds: Normal breath sounds.  Musculoskeletal:        General: Normal range of motion.     Cervical back: Normal range of motion and neck supple.  Skin:    General: Skin is warm and dry.     Capillary Refill: Capillary refill takes less than 2 seconds.  Neurological:     General: No focal deficit present.     Mental Status: She is alert and oriented to person, place, and time.  Psychiatric:        Mood and Affect: Mood normal.        Behavior: Behavior normal.    A total of 30 minutes was spent with the patient, greater than 50% of which was in counseling/coordination of care regarding diabetes and cardiovascular risks associated with this condition, review of all medications, education on nutrition, review of most  recent blood work results, review of most recent office visit notes, health maintenance items, prognosis, documentation, and need for follow-up.   ASSESSMENT & PLAN: Clinically stable.  No medical concerns identified during this visit.  Continue present medications.  No changes.  Follow-up after endocrinology visit next month. Nashiya was seen today for diabetes.  Diagnoses and all orders for this visit:  Hypertension associated with diabetes ([place]) -     Insulin Pen Needle (CAREFINE PEN NEEDLES) 32G X 4 MM MISC; Use 4x a day -     Lancets (ONETOUCH DELICA PLUS [id number]) MISC; USE   TO  CHECK GLUCOSE THREE TIMES DAILY -     glucose blood (ONETOUCH VERIO) test strip; USE  STRIP TO CHECK GLUCOSE THREE TIMES DAILY  Need for diphtheria-tetanus-pertussis (Tdap) vaccine -     Tdap vaccine greater than or equal to 7yo IM  Screening for colon cancer -     Ambulatory referral to Gastroenterology  Dyslipidemia associated with type 2 diabetes mellitus [facility])    Patient Instructions       If you have lab work done today you will be contacted with your lab results within the next 2 weeks.  If you have not heard from [place] then please contact us. The fastest way to get your results is to register for My Chart.   IF you received an x-ray today, you will receive an invoice from [facility] Radiology. Please contact [facility] Radiology at [phone] with questions or concerns regarding your invoice.   IF you received labwork today, you will receive an invoice from [place]. Please contact [facility] at [phone] with questions or concerns regarding your invoice.   Our billing staff will not be able to assist you with questions regarding bills from these companies.  You will be contacted with the lab results as soon as they are available. The fastest way to get your results is to activate your My Chart account. Instructions are located on the last page of this paperwork. If you have not heard from  [place] regarding the results in 2 weeks, please contact this office.     Diabetes mellitus y nutricin, en adultos Diabetes Mellitus and Nutrition, Adult Si sufre de diabetes, o diabetes mellitus, es muy importante tener hbitos alimenticios saludables debido a que sus niveles de Designer, television/film set sangre (glucosa) se ven afectados en gran medida por lo que come y bebe. [place] alimentos saludables en las cantidades correctas, aproximadamente a la misma hora todos los [place], [place] ayudar a:  Aeronautical engineer glucemia.  Disminuir el riesgo de sufrir una enfermedad cardaca.  Mejorar la presin arterial.  Science writer o mantener un peso saludable. Qu puede afectar mi plan de alimentacin? Todas las personas que sufren de diabetes son diferentes y cada una tiene necesidades diferentes en cuanto a un plan de alimentacin. El mdico puede recomendarle que trabaje con un nutricionista para elaborar el mejor plan para usted. Su plan de alimentacin puede variar segn factores como:  Las caloras que necesita.  Los medicamentos que toma.  Su peso.  Sus niveles de glucemia, presin arterial y colesterol.  Su nivel de [place].  Otras afecciones que tenga, como enfermedades cardacas o renales. Cmo me afectan los carbohidratos? Los carbohidratos, o hidratos de carbono, afectan su nivel de glucemia ms que cualquier otro tipo de alimento. La ingesta de carbohidratos naturalmente aumenta la cantidad de [employer]. El recuento de carbohidratos es un mtodo destinado a Catering manager un registro de la cantidad de carbohidratos que se consumen. El recuento de carbohidratos es importante para Theatre manager la glucemia a un nivel saludable, especialmente si utiliza insulina o toma determinados medicamentos por va oral para la diabetes. Es importante conocer la cantidad de carbohidratos que se pueden ingerir en cada comida sin correr Engineer, manufacturing. Esto es Psychologist, forensic. Su nutricionista puede ayudarlo a  calcular la cantidad de carbohidratos que debe ingerir en cada comida y en cada refrigerio. Cmo me afecta el alcohol? El alcohol puede provocar disminuciones sbitas de la glucemia (hipoglucemia), especialmente si utiliza insulina o toma determinados medicamentos por va oral para la diabetes. La  hipoglucemia es Herbalist mortal. Los sntomas de la hipoglucemia, como somnolencia, mareos y confusin, son similares a los sntomas de haber consumido demasiado alcohol.  No beba alcohol si: ? Su mdico le indica no hacerlo. ? Est embarazada, puede estar embarazada o est tratando de quedar embarazada.  Si bebe alcohol: ? No beba con el estmago vaco. ? Limite la cantidad que bebe:  De 0 a 1 medida por da para las mujeres.  De 0 a 2 medidas por da para los hombres. ? Est atento a la cantidad de alcohol que hay en las bebidas que toma. En los [place], una medida equivale a una botella de cerveza de 12oz (321m), un vaso de vino de 5oz (1468m o un vaso de una bebida alcohlica de alta graduacin de 1oz (4471m ? Mantngase hidratado bebiendo agua, refrescos dietticos o t helado sin azcar.  Tenga en cuenta que los refrescos comunes, los jugos y otras bebida para mezOptician, dispensingeden contener mucha azcar y se deben contar como carbohidratos. Consejos para seguir estPhotographers etiquetas de los alimentos  Comience por leer el tamao de la porcin en la "Informacin nutricional" en las etiquetas de los alimentos envasados y las bebidas. La cantidad de caloras, carbohidratos, grasas y otros nutrientes mencionados en la etiqueta se basan en una porcin del alimento. Muchos alimentos contienen ms de una porcin por envase.  Verifique la cantidad total de gramos (g) de carbohidratos totales en una porcin. Puede calcular la cantidad de porciones de carbohidratos al dividir el total de carbohidratos por 15. Por ejemplo, si un alimento tiene un total de 30g de  carbohidratos totales por porcin, equivale a 2 porciones de carbohidratos.  Verifique la cantidad de gramos (g) de grasas saturadas y grasas trans de una porcin. Escoja alimentos que no contengan estas grasas o que su contenido de estas sea bajGuadalupe GuerraVerifique la cantidad de miligramos (mg) de sal (sodio) en una porcin. La mayState Farm las personas deben limitar la ingesta de sodio total a menos de 2300m86mr da. Training and development officeriempre consulte la informacin nutricional de los alimentos etiquetados como "con bajo contenido de grasa" o "sin grasa". Estos alimentos pueden tener un mayor contenido de azcaLocation manageregada o carbohidratos refinados, y deben evitarse.  Hable con su nutricionista para identificar sus objetivos diarios en cuanto a los nutrientes mencionados en la etiqueta. Al ir de compras  Evite comprar alimentos procesados, enlatados o precocidos. Estos alimentos tienden a teneSpecial educational needs teacheror cantidad de grasWellsdio y azcar agregada.  Compre en la zona exterior de la tienda de comestibles. Esta es la zona donde se encuentran con mayor frecuencia las frutas y las verduras frescas, los cereales a granel, las carnes frescas y los productos lcteos frescos. Al cocinar  Utilice mtodos de coccin a baja temperatura, como hornear, en lugar de mtodos de coccin a alta temperatura, como frer en abundante aceite.  Cocine con aceites saludables, como el aceite de olivManvillenola o giraBlufordvite cocinar con manteca, crema o carnes con alto contenido de grasa. Planificacin de las comidas  Coma las comidas y los refrigerios regularmente, preferentemente a la misma hora todos los [place] pasar largos perodos de tiempo sin comer.  Consuma alimentos ricos en fibra, como frutas frescas, verduras, frijoles y cereales integrales. Consulte a su nutricionista sobre cuntas porciones de carbohidratos puede consumir en cada comida.  Consuma entre 4 y 6 onzas (entre 112 y 168g) de protAdvertising account planner da, como  carnes magrDollar Bay  pollo, pescado, huevos o tofu. Una onza (oz) de protena magra equivale a: ? 1 onza (28g) de carne, pollo o pescado. ? 1huevo. ?  de taza (62 g) de tofu.  Coma algunos alimentos por da que contengan grasas saludables, como aguacates, frutos secos, semillas y pescado.   Qu alimentos debo comer? Lambert Mody Bayas. Manzanas. Naranjas. Duraznos. Damascos. Ciruelas. Uvas. Mango. Papaya. [place]. Kiwi. Cerezas. [place] Valeda Malm. Espinaca. Verduras de [employer], que incluyen col rizada, [place], hojas de [place] y de [place]. Remolachas. Coliflor. Repollo. Brcoli. Zanahorias. Judas verdes. Tomates. Pimientos. Cebollas. Pepinos. Coles de Bruselas. Granos Granos integrales, como panes, galletas, tortillas, cereales y pastas de salvado o integrales. Avena sin azcar. Quinua. Arroz integral o salvaje. Carnes y Psychiatric nurse. Carne de ave sin piel. Cortes magros de ave y carne de res. Tofu. Frutos secos. Semillas. Lcteos Productos lcteos sin grasa o con bajo contenido de [place], [place], yogur y [place]. Es posible que los productos que se enumeran ms [place] no constituyan una lista completa de los alimentos y las bebidas que puede tomar. Consulte a un nutricionista para obtener ms informacin. Qu alimentos debo evitar? Lambert Mody Frutas enlatadas al almbar. Verduras Verduras enlatadas. Verduras congeladas con mantequilla o salsa de crema. Granos Productos elaborados con [place] y [place], como panes, pastas, bocadillos y cereales. Evite todos los alimentos procesados. Carnes y otras protenas Cortes de carne con alto contenido de Lobbyist. Carne de ave con piel. Carnes empanizadas o fritas. Carne procesada. Evite las grasas saturadas. Lcteos Yogur, [place] enteros. Bebidas Bebidas azucaradas, como gaseosas o t helado. Es posible que los productos que se enumeran ms [place] no constituyan una lista completa de los alimentos y las bebidas que Materials engineer. Consulte a un nutricionista para obtener ms informacin. Preguntas para hacerle al mdico  Es necesario que me rena con Radio broadcast assistant en el cuidado de la diabetes?  Es necesario que me rena con un nutricionista?  A qu nmero puedo llamar si tengo preguntas?  Cules son los mejores momentos para controlar la glucemia? Dnde encontrar ms informacin:  Asociacin Estadounidense de la Diabetes (American Diabetes Association): diabetes.org  Academy of Nutrition and Dietetics (Academia de Nutricin y Information systems manager): www.eatright.[employer] of Diabetes and Digestive and Kidney Diseases ([place] la Diabetes y [place] y [place]): DesMoinesFuneral.dk  Association of Diabetes Care and Education Specialists (Asociacin de Especialistas en Atencin y Educacin sobre la Diabetes): www.diabeteseducator.org Resumen  Es importante tener hbitos alimenticios saludables debido a que sus niveles de Designer, television/film set sangre (glucosa) se ven afectados en gran medida por lo que come y bebe.  Un plan de alimentacin saludable lo ayudar a controlar la glucemia y Theatre manager un estilo de vida saludable.  El mdico puede recomendarle que trabaje con un nutricionista para elaborar el mejor plan para usted.  Tenga en cuenta que los carbohidratos (hidratos de carbono) y el alcohol tienen efectos inmediatos en sus niveles de glucemia. Es importante contar los carbohidratos que ingiere y consumir alcohol con prudencia. Esta informacin no tiene Marine scientist el consejo del mdico. Asegrese de hacerle al mdico cualquier pregunta que tenga. Document Revised: 09/15/2019 Document Reviewed: 09/15/2019 Elsevier Patient Education  2021 Elsevier Inc.      Agustina Caroli, MD [facility]

## 2020-10-31 ENCOUNTER — Ambulatory Visit: Payer: 59 | Admitting: Emergency Medicine

## 2020-11-07 ENCOUNTER — Encounter: Payer: Self-pay | Admitting: Emergency Medicine

## 2020-11-07 ENCOUNTER — Other Ambulatory Visit: Payer: Self-pay

## 2020-11-07 ENCOUNTER — Ambulatory Visit: Payer: 59 | Admitting: Emergency Medicine

## 2020-11-07 VITALS — BP 112/76 | HR 94 | Temp 97.9°F | Ht 61.0 in | Wt 153.0 lb

## 2020-11-07 DIAGNOSIS — E785 Hyperlipidemia, unspecified: Secondary | ICD-10-CM | POA: Diagnosis not present

## 2020-11-07 DIAGNOSIS — I152 Hypertension secondary to endocrine disorders: Secondary | ICD-10-CM

## 2020-11-07 DIAGNOSIS — E1165 Type 2 diabetes mellitus with hyperglycemia: Secondary | ICD-10-CM | POA: Diagnosis not present

## 2020-11-07 DIAGNOSIS — Z794 Long term (current) use of insulin: Secondary | ICD-10-CM

## 2020-11-07 DIAGNOSIS — E1159 Type 2 diabetes mellitus with other circulatory complications: Secondary | ICD-10-CM

## 2020-11-07 LAB — POCT GLYCOSYLATED HEMOGLOBIN (HGB A1C): Hemoglobin A1C: 8.1 % — AB (ref 4.0–5.6)

## 2020-11-07 LAB — GLUCOSE, POCT (MANUAL RESULT ENTRY): POC Glucose: 181 mg/dl — AB (ref 70–99)

## 2020-11-07 NOTE — Progress Notes (Signed)
Nicole Villanueva 55 y.o.   Chief Complaint  Patient presents with  . Medical Management of Chronic Issues    F/u for HTN and Diabetes   Patient Instructions - documented in this encounter  Patient Instructions [name], MD - 10/04/2020 4:08 PM EST  Formatting of this note is different from the original. Images from the original note were not included. 1. Your diabetes has been uncontrolled for several years due to increased insulin resistance. 2. Need to maintain a consistent, low starch/sugar diet. 3. Referral place for [facility] in [place] for meal planning and dietary support. 4. Blood sugars need to be checked up to 3 times a day (before breakfast, before supper and bedtime). 5. Start Jardiance 59m #1 tablet daily. Contact office if you have yeast infections or side effects. 6. Lower Lantus Solostar from 75 to 60 units at bedtime. 7. Continue Metformin 10095m#1 tablet twice a day. 8. Continue Trulicity 1.2.3FTnce a week. 9. Stop Glimepiride. 10. Follow-up in 2 months.    HISTORY OF PRESENT ILLNESS: This is a [age].o. female with history of diabetes and hypertension here for follow-up. Saw endocrinologist last month.  See note above with all the medication changes. Patient was taken off glimepiride and had insulin decreased to 60 units. Continues to take Trulicity 1.5 mg weekly, Metformin 1000 mg twice a day and was started on Jardiance 10 mg daily. Doing well.  States her blood sugars in the morning are way down in the range of 150 and in the afternoon in the range of 250.  States these are much improved numbers than before.  Was also able to see nutritionist.  Eating much better. Overall much improved.  HPI   Prior to Admission medications   Medication Sig Start Date End Date Taking? Authorizing Provider  blood glucose meter kit and supplies Per insurance preference. Test blood glucose three times daily as directed. Dx E11.65, Z79.4  09/27/18  Yes SaJacelyn PiIrLilia ArgueMD  Dulaglutide (TRULICITY) 1.5 MGDD/2.2GUOPN Inject 1.5 mg into the skin once a week. 08/02/20  Yes Jordyn Doane, MiInes BloomerMD  glucose blood ([facility]) test strip USE  STRIP TO CHECK GLUCOSE THREE TIMES DAILY 09/05/20  Yes Cartina Brousseau, MiInes BloomerMD  insulin glargine (LANTUS SOLOSTAR) 100 UNIT/ML Solostar Pen Inject 70 Units into the skin daily. 06/04/20  Yes Younis Mathey, MiInes BloomerMD  Insulin Pen Needle (CAREFINE PEN NEEDLES) 32G X 4 MM MISC Use 4x a day 09/05/20  Yes Asher Torpey, MiInes BloomerMD  Lancets (ONETOUCH DELICA PLUS [id number]   TO [facility]aling/12/22  Yes SaHorald PollenMD  lisinopril (ZESTRIL) 5 MG tablet Take 1 tablet by mouth once daily 05/08/20  Yes SaJacelyn PiIrLilia ArgueMD  metFORMIN (GLUCOPHAGE) 1000 MG tablet TAKE 1 TABLET BY MOUTH TWICE DAILY WITH A MEAL 05/08/20  Yes SaJacelyn PiIrma M, MD  rosuvastatin (CRESTOR) 10 MG tablet Take 1 tablet (10 mg total) by mouth daily. 08/02/20  Yes Lilyanna Lunt, Ines BloomerMD    No Known Allergies  Patient Active Problem List   Diagnosis Date Noted  . Cough 10/13/2016  . Uncontrolled type 2 diabetes mellitus with hyperglycemia, with long-term current use of insulin ([place]02/15/2018  . Diabetic retinopathy ([facility]10/20/2017  . Uterovaginal prolapse 11/03/2014  . Prolapse of anterior vaginal wall 11/03/2014  . Prolapse of female pelvic organs 06/09/2014  . Hyperlipidemia 03/29/2014  . Depressive disorder, not elsewhere classified 03/29/2014  .  Hot flashes 03/29/2014  . Esophageal reflux 03/29/2014  . Vasovagal syndrome 03/09/2014  . HTN (hypertension) 03/12/2012    Past Medical History:  Diagnosis Date  . Diabetes mellitus   . Hyperlipidemia   . Hypertension     Past Surgical History:  Procedure Laterality Date  . ABDOMINAL HYSTERECTOMY    . CHOLECYSTECTOMY      Social History   Socioeconomic History  . Marital status: Single    Spouse name: Not on  file  . Number of children: 2  . Years of education: Not on file  . Highest education level: Not on file  Occupational History  . Not on file  Tobacco Use  . Smoking status: Never Smoker  . Smokeless tobacco: Never Used  Vaping Use  . Vaping Use: Never used  Substance and Sexual Activity  . Alcohol use: No  . Drug use: No  . Sexual activity: Yes    Birth control/protection: None  Other Topics Concern  . Not on file  Social History Narrative  . Not on file   Social Determinants of Health   Financial Resource Strain: Not on file  Food Insecurity: Not on file  Transportation Needs: Not on file  Physical Activity: Not on file  Stress: Not on file  Social Connections: Not on file  Intimate Partner Violence: Not on file    Family History  Problem Relation Age of Onset  . Diabetes Mother   . Diabetes Father      Review of Systems  Constitutional: Negative.  Negative for chills and fever.  HENT: Negative.  Negative for congestion and sore throat.   Respiratory: Negative.  Negative for cough and shortness of breath.   Cardiovascular: Negative.  Negative for chest pain and palpitations.  Gastrointestinal: Negative.  Negative for abdominal pain, blood in stool, diarrhea, melena, nausea and vomiting.  Genitourinary: Negative.  Negative for dysuria and hematuria.  Skin: Negative.  Negative for rash.  Neurological: Negative.  Negative for dizziness and headaches.  All other systems reviewed and are negative.   Vitals:   11/07/20 1522  BP: 112/76  Pulse: 94  Temp: 97.9 F (36.6 C)  SpO2: 98%   Wt Readings from Last 3 Encounters:  11/07/20 153 lb (69.4 kg)  09/05/20 154 lb (69.9 kg)  08/02/20 148 lb (67.1 kg)    Physical Exam Vitals reviewed.  Constitutional:      Appearance: Normal appearance.  HENT:     Head: Normocephalic.  Cardiovascular:     Rate and Rhythm: Normal rate and regular rhythm.     Pulses: Normal pulses.     Heart sounds: Normal heart sounds.   Pulmonary:     Effort: Pulmonary effort is normal.     Breath sounds: Normal breath sounds.  Musculoskeletal:        General: Normal range of motion.     Cervical back: Normal range of motion.  Skin:    General: Skin is warm and dry.     Capillary Refill: Capillary refill takes less than 2 seconds.  Neurological:     General: No focal deficit present.     Mental Status: She is alert and oriented to person, place, and time.  Psychiatric:        Mood and Affect: Mood normal.        Behavior: Behavior normal.    Results for orders placed or performed in visit on 11/07/20 (from the past 24 hour(s))  POCT glucose (manual entry)  Status: Abnormal   Collection Time: 11/07/20  3:42 PM  Result Value Ref Range   POC Glucose 181 (A) 70 - 99 mg/dl  POCT glycosylated hemoglobin (Hb A1C)     Status: Abnormal   Collection Time: 11/07/20  3:49 PM  Result Value Ref Range   Hemoglobin A1C 8.1 (A) 4.0 - 5.6 %   HbA1c POC (<> result, manual entry)     HbA1c, POC (prediabetic range)     HbA1c, POC (controlled diabetic range)       ASSESSMENT & PLAN: Hypertension associated with diabetes ([place]) Normotensive.  Continue lisinopril 5 mg daily. Much improved diabetes with hemoglobin A1c at 8.1. Continue present medications.  No changes. Continue rosuvastatin 10 mg daily. Diet and nutrition discussed. Continue monitoring blood sugars at home. Follow-up with endocrinologist as scheduled. Follow-up with me in 3 months.  Chardae was seen today for medical management of chronic issues.  Diagnoses and all orders for this visit:  Hypertension associated with diabetes ([place]) -     POCT glucose (manual entry) -     POCT glycosylated hemoglobin (Hb A1C)  Uncontrolled type 2 diabetes mellitus with hyperglycemia, with long-term current use of insulin ([place])  Dyslipidemia    Patient Instructions       If you have lab work done today you will be contacted with your lab results within the next  2 weeks.  If you have not heard from [place] then please contact us. The fastest way to get your results is to register for My Chart.   IF you received an x-ray today, you will receive an invoice from [facility] Rad[facility]. Please contact [facility] at [phone] with questions or concerns regarding your invoice.   IF you received labwork today, you will receive an invoice from [facility]. Please contact [facility] at [phone] with questions or concerns regarding your invoice.   Our billing staff will not be able to assist you with questions regarding bills from these companies.  You will be contacted with the lab results as soon as they are available. The fastest way to get your results is to activate your My Chart account. Instructions are located on the last page of this paperwork. If you have not heard from [place] regarding the results in 2 weeks, please contact this office.      Diabetes mellitus y nutricin, en adultos Diabetes Mellitus and Nutrition, Adult Si sufre de diabetes, o diabetes mellitus, es muy importante tener hbitos alimenticios saludables debido a que sus niveles de Designer, television/film set sangre (glucosa) se ven afectados en gran medida por lo que come y bebe. [place] alimentos saludables en las cantidades correctas, aproximadamente a la misma hora todos los [place], [place] ayudar a:  Aeronautical engineer glucemia.  Disminuir el riesgo de sufrir una enfermedad cardaca.  Mejorar la presin arterial.  Science writer o mantener un peso saludable. Qu puede afectar mi plan de alimentacin? Todas las personas que sufren de diabetes son diferentes y cada una tiene necesidades diferentes en cuanto a un plan de alimentacin. El mdico puede recomendarle que trabaje con un nutricionista para elaborar el mejor plan para usted. Su plan de alimentacin puede variar segn factores como:  Las caloras que necesita.  Los medicamentos que toma.  Su peso.  Sus niveles de glucemia, presin arterial y  colesterol.  Su nivel de [place].  Otras afecciones que tenga, como enfermedades cardacas o renales. Cmo me afectan los carbohidratos? Los carbohidratos, o hidratos de carbono, afectan su nivel de glucemia ms que  cualquier otro tipo de alimento. La ingesta de carbohidratos naturalmente aumenta la cantidad de [employer]. El recuento de carbohidratos es un mtodo destinado a Catering manager un registro de la cantidad de carbohidratos que se consumen. El recuento de carbohidratos es importante para Theatre manager la glucemia a un nivel saludable, especialmente si utiliza insulina o toma determinados medicamentos por va oral para la diabetes. Es importante conocer la cantidad de carbohidratos que se pueden ingerir en cada comida sin correr Engineer, manufacturing. Esto es Psychologist, forensic. Su nutricionista puede ayudarlo a calcular la cantidad de carbohidratos que debe ingerir en cada comida y en cada refrigerio. Cmo me afecta el alcohol? El alcohol puede provocar disminuciones sbitas de la glucemia (hipoglucemia), especialmente si utiliza insulina o toma determinados medicamentos por va oral para la diabetes. La hipoglucemia es una afeccin potencialmente mortal. Los sntomas de la hipoglucemia, como somnolencia, mareos y confusin, son similares a los sntomas de haber consumido demasiado alcohol.  No beba alcohol si: ? Su mdico le indica no hacerlo. ? Est embarazada, puede estar embarazada o est tratando de quedar embarazada.  Si bebe alcohol: ? No beba con el estmago vaco. ? Limite la cantidad que bebe:  De 0 a 1 medida por da para las mujeres.  De 0 a 2 medidas por da para los hombres. ? Est atento a la cantidad de alcohol que hay en las bebidas que toma. En los [place], una medida equivale a una botella de cerveza de 12oz (352m), un vaso de vino de 5oz (1443m o un vaso de una bebida alcohlica de alta graduacin de 1oz (4419m ? Mantngase hidratado bebiendo  agua, refrescos dietticos o t helado sin azcar.  Tenga en cuenta que los refrescos comunes, los jugos y otras bebida para mezOptician, dispensingeden contener mucha azcar y se deben contar como carbohidratos. Consejos para seguir estPhotographers etiquetas de los alimentos  Comience por leer el tamao de la porcin en la "Informacin nutricional" en las etiquetas de los alimentos envasados y las bebidas. La cantidad de caloras, carbohidratos, grasas y otros nutrientes mencionados en la etiqueta se basan en una porcin del alimento. Muchos alimentos contienen ms de una porcin por envase.  Verifique la cantidad total de gramos (g) de carbohidratos totales en una porcin. Puede calcular la cantidad de porciones de carbohidratos al dividir el total de carbohidratos por 15. Por ejemplo, si un alimento tiene un total de 30g de carbohidratos totales por porcin, equivale a 2 porciones de carbohidratos.  Verifique la cantidad de gramos (g) de grasas saturadas y grasas trans de una porcin. Escoja alimentos que no contengan estas grasas o que su contenido de estas sea bajDutch IslandVerifique la cantidad de miligramos (mg) de sal (sodio) en una porcin. La mayState Farm las personas deben limitar la ingesta de sodio total a menos de 2300m36mr da. Training and development officeriempre consulte la informacin nutricional de los alimentos etiquetados como "con bajo contenido de grasa" o "sin grasa". Estos alimentos pueden tener un mayor contenido de azcaLocation manageregada o carbohidratos refinados, y deben evitarse.  Hable con su nutricionista para identificar sus objetivos diarios en cuanto a los nutrientes mencionados en la etiqueta. Al ir de compras  Evite comprar alimentos procesados, enlatados o precocidos. Estos alimentos tienden a teneSpecial educational needs teacheror cantidad de grasBulpittdio y azcar agregada.  Compre en la zona exterior de la tienda de comestibles. Esta es la zona donde se encuentran con mayor frecuencia las frutas y las verduras frescas,  los  cereales a granel, las carnes frescas y los productos lcteos frescos. Al cocinar  Utilice mtodos de coccin a baja temperatura, como hornear, en lugar de mtodos de coccin a alta temperatura, como frer en abundante aceite.  Cocine con aceites saludables, como el aceite de [place], canola o [place].  Evite cocinar con manteca, crema o carnes con alto contenido de grasa. Planificacin de las comidas  Coma las comidas y los refrigerios regularmente, preferentemente a la misma hora todos [place]. Evite pasar largos perodos de tiempo sin comer.  Consuma alimentos ricos en fibra, como frutas frescas, verduras, frijoles y cereales integrales. Consulte a su nutricionista sobre cuntas porciones de carbohidratos puede consumir en cada comida.  Consuma entre 4 y 6 onzas (entre 112 y 168g) de protenas magras por da, como carnes magras, pollo, pescado, huevos o tofu. Una onza (oz) de protena magra equivale a: ? 1 onza (28g) de carne, pollo o pescado. ? 1huevo. ?  de taza (62 g) de tofu.  Coma algunos alimentos por da que contengan grasas saludables, como aguacates, frutos secos, semillas y pescado.   Qu alimentos debo comer? Lambert Mody Bayas. Manzanas. Naranjas. Duraznos. Damascos. Ciruelas. Uvas. Mango. Papaya. [place]. Kiwi. Cerezas. [place] Valeda Malm. [place]. Verduras de [employer], que incluyen col rizada, [place], hojas de [place] y de [place]. Remolachas. Coliflor. Repollo. Brcoli. Zanahorias. Judas verdes. Tomates. Pimientos. Cebollas. Pepinos. Coles de Bruselas. Granos Granos integrales, como panes, galletas, tortillas, cereales y pastas de salvado o integrales. Avena sin azcar. Quinua. Arroz integral o salvaje. Carnes y Psychiatric nurse. Carne de ave sin piel. Cortes magros de ave y carne de res. Tofu. Frutos secos. Semillas. Lcteos Productos lcteos sin grasa o con bajo contenido de [place], [place], yogur y [place]. Es posible que los productos que se enumeran ms  [place] no constituyan una lista completa de los alimentos y las bebidas que puede tomar. Consulte a un nutricionista para obtener ms informacin. Qu alimentos debo evitar? Lambert Mody Frutas enlatadas al almbar. Verduras Verduras enlatadas. Verduras congeladas con mantequilla o salsa de crema. Granos Productos elaborados con [place] y [place], como panes, pastas, bocadillos y cereales. Evite todos los alimentos procesados. Carnes y otras protenas Cortes de carne con alto contenido de Lobbyist. Carne de ave con piel. Carnes empanizadas o fritas. Carne procesada. Evite las grasas saturadas. Lcteos Yogur, [place] enteros. Bebidas Bebidas azucaradas, como gaseosas o t helado. Es posible que los productos que se enumeran ms [place] no constituyan una lista completa de los alimentos y las bebidas que Nurse, adult. Consulte a un nutricionista para obtener ms informacin. Preguntas para hacerle al mdico  Es necesario que me rena con Radio broadcast assistant en el cuidado de la diabetes?  Es necesario que me rena con un nutricionista?  A qu nmero puedo llamar si tengo preguntas?  Cules son los mejores momentos para controlar la glucemia? Dnde encontrar ms informacin:  Asociacin Estadounidense de la Diabetes (American Diabetes Association): diabetes.org  Academy of Nutrition and Dietetics (Academia de Nutricin y Information systems manager): www.eatright.[employer]X Corporation of Diabetes and Digestive and Kidney Diseases ([place] la Diabetes y [place] y [place]): DesMoinesFuneral.dk  Association of Diabetes Care and Education Specialists (Asociacin de Especialistas en Atencin y Educacin sobre la Diabetes): www.diabeteseducator.org Resumen  Es importante tener hbitos alimenticios saludables debido a que sus niveles de Designer, television/film set sangre (glucosa) se ven afectados en gran medida por lo que come y bebe.  Un plan de alimentacin saludable lo ayudar  a controlar la glucemia y Theatre manager un estilo de vida saludable.  El mdico puede recomendarle que trabaje con un nutricionista para elaborar el mejor plan para usted.  Tenga en cuenta que los carbohidratos (hidratos de carbono) y el alcohol tienen efectos inmediatos en sus niveles de glucemia. Es importante contar los carbohidratos que ingiere y consumir alcohol con prudencia. Esta informacin no tiene Marine scientist el consejo del mdico. Asegrese de hacerle al mdico cualquier pregunta que tenga. Document Revised: 09/15/2019 Document Reviewed: 09/15/2019 Elsevier Patient Education  2021 Elsevier Inc.      Agustina Caroli, MD [facility]

## 2020-11-07 NOTE — Patient Instructions (Addendum)
   If you have lab work done today you will be contacted with your lab results within the next 2 weeks.  If you have not heard from us then please contact us. The fastest way to get your results is to register for My Chart.   IF you received an x-ray today, you will receive an invoice from [facility]. Please contact [facility] at [phone] with questions or concerns regarding your invoice.   IF you received labwork today, you will receive an invoice from [facility]. Please contact [facility] at [phone] with questions or concerns regarding your invoice.   Our billing staff will not be able to assist you with questions regarding bills from these companies.  You will be contacted with the lab results as soon as they are available. The fastest way to get your results is to activate your My Chart account. Instructions are located on the last page of this paperwork. If you have not heard from us regarding the results in 2 weeks, please contact this office.     Diabetes mellitus y nutricin, en adultos Diabetes Mellitus and Nutrition, Adult Si sufre de diabetes, o diabetes mellitus, es muy importante tener hbitos alimenticios saludables debido a que sus niveles de azcar en la sangre (glucosa) se ven afectados en gran medida por lo que come y bebe. [place] alimentos saludables en las cantidades correctas, aproximadamente a la misma hora todos los das, lo ayudar a:  Controlar la glucemia.  Disminuir el riesgo de sufrir una enfermedad cardaca.  Mejorar la presin arterial.  Alcanzar o mantener un peso saludable. Qu puede afectar mi plan de alimentacin? Todas las personas que sufren de diabetes son diferentes y cada una tiene necesidades diferentes en cuanto a un plan de alimentacin. El mdico puede recomendarle que trabaje con un nutricionista para elaborar el mejor plan para usted. Su plan de alimentacin puede variar segn factores como:  Las caloras que  necesita.  Los medicamentos que toma.  Su peso.  Sus niveles de glucemia, presin arterial y colesterol.  Su nivel de actividad.  Otras afecciones que tenga, como enfermedades cardacas o renales. Cmo me afectan los carbohidratos? Los carbohidratos, o hidratos de carbono, afectan su nivel de glucemia ms que cualquier otro tipo de alimento. La ingesta de carbohidratos naturalmente aumenta la cantidad de glucosa en la sangre. El recuento de carbohidratos es un mtodo destinado a llevar un registro de la cantidad de carbohidratos que se consumen. El recuento de carbohidratos es importante para mantener la glucemia a un nivel saludable, especialmente si utiliza insulina o toma determinados medicamentos por va oral para la diabetes. Es importante conocer la cantidad de carbohidratos que se pueden ingerir en cada comida sin correr ningn riesgo. Esto es diferente en cada persona. Su nutricionista puede ayudarlo a calcular la cantidad de carbohidratos que debe ingerir en cada comida y en cada refrigerio. Cmo me afecta el alcohol? El alcohol puede provocar disminuciones sbitas de la glucemia (hipoglucemia), especialmente si utiliza insulina o toma determinados medicamentos por va oral para la diabetes. La hipoglucemia es una afeccin potencialmente mortal. Los sntomas de la hipoglucemia, como somnolencia, mareos y confusin, son similares a los sntomas de haber consumido demasiado alcohol.  No beba alcohol si: ? Su mdico le indica no hacerlo. ? Est embarazada, puede estar embarazada o est tratando de quedar embarazada.  Si bebe alcohol: ? No beba con el estmago vaco. ? Limite la cantidad que bebe:  De 0 a 1 medida por da para las   mujeres.  De 0 a 2 medidas por da para los hombres. ? Est atento a la cantidad de alcohol que hay en las bebidas que toma. En los [place], una medida equivale a una botella de cerveza de 12oz (355ml), un vaso de vino de 5oz (148ml) o un vaso  de una bebida alcohlica de alta graduacin de 1oz (44ml). ? Mantngase hidratado bebiendo agua, refrescos dietticos o t helado sin azcar.  Tenga en cuenta que los refrescos comunes, los jugos y otras bebida para mezclar pueden contener mucha azcar y se deben contar como carbohidratos. Consejos para seguir este plan Leer las etiquetas de los alimentos  Comience por leer el tamao de la porcin en la "Informacin nutricional" en las etiquetas de los alimentos envasados y las bebidas. La cantidad de caloras, carbohidratos, grasas y otros nutrientes mencionados en la etiqueta se basan en una porcin del alimento. Muchos alimentos contienen ms de una porcin por envase.  Verifique la cantidad total de gramos (g) de carbohidratos totales en una porcin. Puede calcular la cantidad de porciones de carbohidratos al dividir el total de carbohidratos por 15. Por ejemplo, si un alimento tiene un total de 30g de carbohidratos totales por porcin, equivale a 2 porciones de carbohidratos.  Verifique la cantidad de gramos (g) de grasas saturadas y grasas trans de una porcin. Escoja alimentos que no contengan estas grasas o que su contenido de estas sea bajo.  Verifique la cantidad de miligramos (mg) de sal (sodio) en una porcin. La mayora de las personas deben limitar la ingesta de sodio total a menos de 2300mg por da.  Siempre consulte la informacin nutricional de los alimentos etiquetados como "con bajo contenido de grasa" o "sin grasa". Estos alimentos pueden tener un mayor contenido de azcar agregada o carbohidratos refinados, y deben evitarse.  Hable con su nutricionista para identificar sus objetivos diarios en cuanto a los nutrientes mencionados en la etiqueta. Al ir de compras  Evite comprar alimentos procesados, enlatados o precocidos. Estos alimentos tienden a tener una mayor cantidad de grasa, sodio y azcar agregada.  Compre en la zona exterior de la tienda de comestibles. Esta es  la zona donde se encuentran con mayor frecuencia las frutas y las verduras frescas, los cereales a granel, las carnes frescas y los productos lcteos frescos. Al cocinar  Utilice mtodos de coccin a baja temperatura, como hornear, en lugar de mtodos de coccin a alta temperatura, como frer en abundante aceite.  Cocine con aceites saludables, como el aceite de oliva, canola o girasol.  Evite cocinar con manteca, crema o carnes con alto contenido de grasa. Planificacin de las comidas  Coma las comidas y los refrigerios regularmente, preferentemente a la misma hora todos los das. Evite pasar largos perodos de tiempo sin comer.  Consuma alimentos ricos en fibra, como frutas frescas, verduras, frijoles y cereales integrales. Consulte a su nutricionista sobre cuntas porciones de carbohidratos puede consumir en cada comida.  Consuma entre 4 y 6 onzas (entre 112 y 168g) de protenas magras por da, como carnes magras, pollo, pescado, huevos o tofu. Una onza (oz) de protena magra equivale a: ? 1 onza (28g) de carne, pollo o pescado. ? 1huevo. ?  de taza (62 g) de tofu.  Coma algunos alimentos por da que contengan grasas saludables, como aguacates, frutos secos, semillas y pescado.   Qu alimentos debo comer? Frutas Bayas. Manzanas. Naranjas. Duraznos. Damascos. Ciruelas. Uvas. Mango. Papaya. Granada. Kiwi. Cerezas. Verduras Lechuga. Espinaca. Verduras de hoja verde, que incluyen   col rizada, acelga, hojas de berza y de mostaza. Remolachas. Coliflor. Repollo. Brcoli. Zanahorias. Judas verdes. Tomates. Pimientos. Cebollas. Pepinos. Coles de Bruselas. Granos Granos integrales, como panes, galletas, tortillas, cereales y pastas de salvado o integrales. Avena sin azcar. Quinua. Arroz integral o salvaje. Carnes y otras protenas Mariscos. Carne de ave sin piel. Cortes magros de ave y carne de res. Tofu. Frutos secos. Semillas. Lcteos Productos lcteos sin grasa o con bajo contenido de  grasa, como leche, yogur y queso. Es posible que los productos que se enumeran ms arriba no constituyan una lista completa de los alimentos y las bebidas que puede tomar. Consulte a un nutricionista para obtener ms informacin. Qu alimentos debo evitar? Frutas Frutas enlatadas al almbar. Verduras Verduras enlatadas. Verduras congeladas con mantequilla o salsa de crema. Granos Productos elaborados con harina y harina blanca refinada, como panes, pastas, bocadillos y cereales. Evite todos los alimentos procesados. Carnes y otras protenas Cortes de carne con alto contenido de grasa. Carne de ave con piel. Carnes empanizadas o fritas. Carne procesada. Evite las grasas saturadas. Lcteos Yogur, queso o leche enteros. Bebidas Bebidas azucaradas, como gaseosas o t helado. Es posible que los productos que se enumeran ms arriba no constituyan una lista completa de los alimentos y las bebidas que debe evitar. Consulte a un nutricionista para obtener ms informacin. Preguntas para hacerle al mdico  Es necesario que me rena con un instructor en el cuidado de la diabetes?  Es necesario que me rena con un nutricionista?  A qu nmero puedo llamar si tengo preguntas?  Cules son los mejores momentos para controlar la glucemia? Dnde encontrar ms informacin:  Asociacin Estadounidense de la Diabetes (American Diabetes Association): diabetes.org  Academy of Nutrition and Dietetics (Academia de Nutricin y Diettica): www.eatright.org  National Institute of Diabetes and Digestive and Kidney Diseases (Instituto Nacional de la Diabetes y las Enfermedades Digestivas y Renales): www.niddk.nih.gov  Association of Diabetes Care and Education Specialists (Asociacin de Especialistas en Atencin y Educacin sobre la Diabetes): www.diabeteseducator.org Resumen  Es importante tener hbitos alimenticios saludables debido a que sus niveles de azcar en la sangre (glucosa) se ven afectados en  gran medida por lo que come y bebe.  Un plan de alimentacin saludable lo ayudar a controlar la glucemia y mantener un estilo de vida saludable.  El mdico puede recomendarle que trabaje con un nutricionista para elaborar el mejor plan para usted.  Tenga en cuenta que los carbohidratos (hidratos de carbono) y el alcohol tienen efectos inmediatos en sus niveles de glucemia. Es importante contar los carbohidratos que ingiere y consumir alcohol con prudencia. Esta informacin no tiene como fin reemplazar el consejo del mdico. Asegrese de hacerle al mdico cualquier pregunta que tenga. Document Revised: 09/15/2019 Document Reviewed: 09/15/2019 Elsevier Patient Education  2021 Elsevier Inc.  

## 2020-11-07 NOTE — Assessment & Plan Note (Signed)
Normotensive.  Continue lisinopril 5 mg daily. Much improved diabetes with hemoglobin A1c at 8.1. Continue present medications.  No changes. Continue rosuvastatin 10 mg daily. Diet and nutrition discussed. Continue monitoring blood sugars at home. Follow-up with endocrinologist as scheduled. Follow-up with me in 3 months.

## 2020-12-04 ENCOUNTER — Ambulatory Visit: Payer: Self-pay | Admitting: Emergency Medicine

## 2021-01-22 ENCOUNTER — Other Ambulatory Visit: Payer: Self-pay | Admitting: Emergency Medicine

## 2021-01-22 DIAGNOSIS — I152 Hypertension secondary to endocrine disorders: Secondary | ICD-10-CM

## 2021-01-22 DIAGNOSIS — E1165 Type 2 diabetes mellitus with hyperglycemia: Secondary | ICD-10-CM

## 2021-01-22 DIAGNOSIS — Z794 Long term (current) use of insulin: Secondary | ICD-10-CM

## 2021-01-22 DIAGNOSIS — E1159 Type 2 diabetes mellitus with other circulatory complications: Secondary | ICD-10-CM

## 2021-05-28 ENCOUNTER — Telehealth: Payer: Self-pay | Admitting: Emergency Medicine

## 2021-05-28 ENCOUNTER — Encounter: Payer: Self-pay | Admitting: Emergency Medicine

## 2021-05-28 ENCOUNTER — Telehealth (INDEPENDENT_AMBULATORY_CARE_PROVIDER_SITE_OTHER): Payer: 59 | Admitting: Emergency Medicine

## 2021-05-28 ENCOUNTER — Other Ambulatory Visit: Payer: Self-pay

## 2021-05-28 DIAGNOSIS — U071 COVID-19: Secondary | ICD-10-CM | POA: Diagnosis not present

## 2021-05-28 DIAGNOSIS — I152 Hypertension secondary to endocrine disorders: Secondary | ICD-10-CM

## 2021-05-28 DIAGNOSIS — E1159 Type 2 diabetes mellitus with other circulatory complications: Secondary | ICD-10-CM | POA: Diagnosis not present

## 2021-05-28 MED ORDER — NIRMATRELVIR/RITONAVIR (PAXLOVID)TABLET: 3.0000 | ORAL_TABLET | Freq: Two times a day (BID) | ORAL | 0 refills | Status: AC

## 2021-05-28 NOTE — Telephone Encounter (Signed)
Victorino Dike from [facility] called  Requesting verification on possible medication interaction with paxovid and rosuvastatin.   Best contact #: [phone]

## 2021-05-28 NOTE — Telephone Encounter (Signed)
Yes that is okay.  Thanks.

## 2021-05-28 NOTE — Progress Notes (Signed)
Telemedicine Encounter- SOAP NOTE Established Patient MyChart video conference Patient: Home  Provider: Office   Patient present only  This video telephone encounter was conducted with the patient's (or proxy's) verbal consent via video audio telecommunications: yes/no: Yes Patient was instructed to have this encounter in a suitably private space; and to only have persons present to whom they give permission to participate. In addition, patient identity was confirmed by use of name plus two identifiers (DOB and address).  I discussed the limitations, risks, security and privacy concerns of performing an evaluation and management service by telephone and the availability of in person appointments. I also discussed with the patient that there may be a patient responsible charge related to this service. The patient expressed understanding and agreed to proceed.  I spent a total of TIME; 0 MIN TO 60 MIN: 20 minutes talking with the patient or their proxy.  Chief complaint: COVID infection.  Subjective   Nicole Villanueva is a 55 y.o. female established patient. Telephone visit today complaining of flulike symptoms that started 3 days ago.  Tested positive for COVID.  Denies difficulty breathing.  Denies chest pain or palpitations.  Able to eat and drink.  Denies nausea or vomiting.  Denies abdominal pain or diarrhea.  Lives alone.  No other significant associated symptoms No other complaints or medical concerns today.  HPI   Patient Active Problem List   Diagnosis Date Noted   Uncontrolled type 2 diabetes mellitus with hyperglycemia, with long-term current use of insulin ([place]) 10/09/2016   Diabetic retinopathy ([place]) 06/13/2016   Uterovaginal prolapse 11/03/2014   Prolapse of anterior vaginal wall 11/03/2014   Prolapse of female pelvic organs 06/09/2014   Hyperlipidemia 03/29/2014   Depressive disorder, not elsewhere classified 03/29/2014   Esophageal reflux 03/29/2014    Hypertension associated with diabetes ([place]) 03/12/2012    Past Medical History:  Diagnosis Date   Diabetes mellitus    Hyperlipidemia    Hypertension     Current Outpatient Medications  Medication Sig Dispense Refill   blood glucose meter kit and supplies Per insurance preference. Test blood glucose three times daily as directed. Dx E11.65, Z79.4 1 each 11   Dulaglutide (TRULICITY) 1.5 ZJ/6.9CV SOPN Inject 1.5 mg into the skin once a week. 6 mL 3   insulin glargine (LANTUS SOLOSTAR) 100 UNIT/ML Solostar Pen Inject 60 Units into the skin daily. 15 mL 3   Insulin Pen Needle (CAREFINE PEN NEEDLES) 32G X 4 MM MISC Use 4x a day 300 each 3   Lancets (ONETOUCH DELICA PLUS [id number]) MISC USE   TO CHECK GLUCOSE THREE TIMES DAILY 100 each 0   lisinopril (ZESTRIL) 5 MG tablet Take 1 tablet by mouth once daily 90 tablet 1   metFORMIN (GLUCOPHAGE) 1000 MG tablet TAKE 1 TABLET BY MOUTH TWICE DAILY WITH A MEAL 180 tablet 1   ONETOUCH VERIO test strip USE  STRIP TO CHECK GLUCOSE THREE TIMES DAILY 100 each 0   rosuvastatin (CRESTOR) 10 MG tablet Take 1 tablet (10 mg total) by mouth daily. 90 tablet 3   No current facility-administered medications for this visit.    No Known Allergies  Social History   Socioeconomic History   Marital status: Single    Spouse name: Not on file   Number of children: 2   Years of education: Not on file   Highest education level: Not on file  Occupational History   Not on file  Tobacco Use   Smoking status: Never  Smokeless tobacco: Never  Vaping Use   Vaping Use: Never used  Substance and Sexual Activity   Alcohol use: No   Drug use: No   Sexual activity: Yes    Birth control/protection: None  Other Topics Concern   Not on file  Social History Narrative   Not on file   Social Determinants of Health   Financial Resource Strain: Not on file  Food Insecurity: Not on file  Transportation Needs: Not on file  Physical Activity: Not on file  Stress:  Not on file  Social Connections: Not on file  Intimate Partner Violence: Not on file    Review of Systems  Constitutional: Negative.  Negative for chills and fever.  HENT:  Positive for congestion and sore throat.   Respiratory:  Positive for cough. Negative for shortness of breath.   Cardiovascular: Negative.  Negative for chest pain and palpitations.  Gastrointestinal:  Negative for abdominal pain, diarrhea, nausea and vomiting.  Genitourinary: Negative.   Musculoskeletal:  Positive for myalgias.  Skin: Negative.  Negative for rash.  Neurological:  Negative for dizziness and headaches.  All other systems reviewed and are negative.  Objective  Alert and oriented x3 in no apparent respiratory distress. Vitals as reported by the patient: There were no vitals filed for this visit.  Diagnoses and all orders for this visit:  COVID-19 virus infection -     nirmatrelvir/ritonavir EUA (PAXLOVID) 20 x 150 MG & 10 x 100MG TABS; Take 3 tablets by mouth 2 (two) times daily for 5 days. (Take nirmatrelvir 150 mg two tablets twice daily for 5 days and ritonavir 100 mg one tablet twice daily for 5 days) Patient GFR is 106  Hypertension associated with diabetes ([place])    Clinically stable.  No red flag signs or symptoms.  COVID precautions given.  We will start Paxlovid today. ED precautions given.  Advised to rest and drink plenty of fluids. Advised to contact the office if no better or worse during the next several days.  I discussed the assessment and treatment plan with the patient. The patient was provided an opportunity to ask questions and all were answered. The patient agreed with the plan and demonstrated an understanding of the instructions.   The patient was advised to call back or seek an in-person evaluation if the symptoms worsen or if the condition fails to improve as anticipated.  I provided 20 minutes of non-face-to-face time during this encounter.  Horald Pollen,  MD  Primary Care at [facility]

## 2021-05-29 NOTE — Telephone Encounter (Signed)
Called [facility] and spoke with Bridgette, and she states that she will call pt to let her know that it is ok to take medication together.

## 2021-09-23 ENCOUNTER — Ambulatory Visit: Payer: 59 | Admitting: Internal Medicine

## 2021-09-23 ENCOUNTER — Other Ambulatory Visit: Payer: Self-pay

## 2021-09-23 ENCOUNTER — Encounter: Payer: Self-pay | Admitting: Internal Medicine

## 2021-09-23 DIAGNOSIS — L509 Urticaria, unspecified: Secondary | ICD-10-CM

## 2021-09-23 DIAGNOSIS — Z794 Long term (current) use of insulin: Secondary | ICD-10-CM | POA: Diagnosis not present

## 2021-09-23 DIAGNOSIS — E1165 Type 2 diabetes mellitus with hyperglycemia: Secondary | ICD-10-CM | POA: Diagnosis not present

## 2021-09-23 NOTE — Progress Notes (Signed)
° °  Subjective:   Patient ID: Nicole Villanueva, female    DOB: 1966-04-23, 56 y.o.   MRN: [id number]  HPI The patient is a 56 YO female coming in for possible allergic reaction. About 1-2 weeks ago she had itching sensation around time of eating. Then later that day noticed hives and itching on the face and neck. No face swelling or tongue or mouth swelling. No SOB. Next day still itching and hives spreading. Sister gave her 1 benadryl which helped some. She then randomly would have this sensation and was concerned it was her insulin (brand changed about 2 months ago) so stopped this 5 days ago. She did still have mild hives day after insulin stopped and took another benadryl. This has gone away now and in the last 24 hours has not had any hives. She has not changed any soap/shampoo or new clothes. No change or new foods. This was not associated with any specific foods. She denies medication change other than brand of insulin. She denies new otc medications.   Review of Systems  Constitutional: Negative.   HENT: Negative.    Eyes: Negative.   Respiratory:  Negative for cough, chest tightness and shortness of breath.   Cardiovascular:  Negative for chest pain, palpitations and leg swelling.  Gastrointestinal:  Negative for abdominal distention, abdominal pain, constipation, diarrhea, nausea and vomiting.  Musculoskeletal: Negative.   Skin:  Positive for rash.  Neurological: Negative.   Psychiatric/Behavioral: Negative.     Objective:  Physical Exam Constitutional:      Appearance: She is well-developed.  HENT:     Head: Normocephalic and atraumatic.  Cardiovascular:     Rate and Rhythm: Normal rate and regular rhythm.  Pulmonary:     Effort: Pulmonary effort is normal. No respiratory distress.     Breath sounds: Normal breath sounds. No wheezing or rales.  Abdominal:     General: Bowel sounds are normal. There is no distension.     Palpations: Abdomen is soft.     Tenderness:  There is no abdominal tenderness. There is no rebound.  Musculoskeletal:     Cervical back: Normal range of motion.  Skin:    General: Skin is warm and dry.  Neurological:     Mental Status: She is alert and oriented to person, place, and time.     Coordination: Coordination normal.    Vitals:   09/23/21 1040  BP: 122/70  Pulse: 85  Resp: 18  SpO2: 98%  Weight: 146 lb 6.4 oz (66.4 kg)  Height: 5\' 1"  (1.549 m)    This visit occurred during the SARS-CoV-2 public health emergency.  Safety protocols were in place, including screening questions prior to the visit, additional usage of staff PPE, and extensive cleaning of exam room while observing appropriate contact time as indicated for disinfecting solutions.   Assessment & Plan:  Visit time 25 minutes in face to face communication with patient and coordination of care, additional 10 minutes spent in record review, coordination or care, ordering tests, communicating/referring to other healthcare professionals, documenting in medical records all on the same day of the visit for total time 35 minutes spent on the visit.

## 2021-09-23 NOTE — Patient Instructions (Signed)
Keep off the insulin until you see the diabetes doctor.   If you get any problems again call us or send a message

## 2021-09-24 DIAGNOSIS — L509 Urticaria, unspecified: Secondary | ICD-10-CM | POA: Insufficient documentation

## 2021-09-24 NOTE — Assessment & Plan Note (Signed)
Will let her endocrinologist know about her reaction and that she has stopped her toujeo due to feeling like this was the cause. I offered to send in the prior brand lantus and she did want to wait until upcoming visit in 1-2 weeks. This is reasonable. If recurrence of hives/rash then we will know that the insulin is not responsible. It is possible that this could be related to lisinopril and if recurrent I would recommend to stop this. I talked to her about lip and mouth swelling. Initially she did not report this and then stated she may have had some lip swelling for a few hours one day but then not again. I advised that lisinopril can cause angioedema and randomly even without dose increase or change. She will continue on trulicity and metformin/jardiance and crestor.

## 2021-09-24 NOTE — Assessment & Plan Note (Signed)
At this time reaction is resolved. Steroids are not indicated due to poorly controlled diabetes (improving and last Hga1c 8.2). If recurrence of symptoms she is to call or message [place] immediately and we would prescribe steroids and stop lisinopril. She is thinking that her insulin is the cause and wishes to remain off this for now since rash has resolved with cessation. Okay and will forward this note to her endocrinologist so they will be aware.

## 2021-12-19 IMAGING — MG DIGITAL SCREENING BILAT W/ TOMO W/ CAD
8 series · 8 of 24 positions shown · non-contrast
Comparison: Previous exam(s).

CLINICAL DATA: Screening.

EXAM:
DIGITAL SCREENING BILATERAL MAMMOGRAM WITH TOMO AND CAD

[R CC synth-2D]
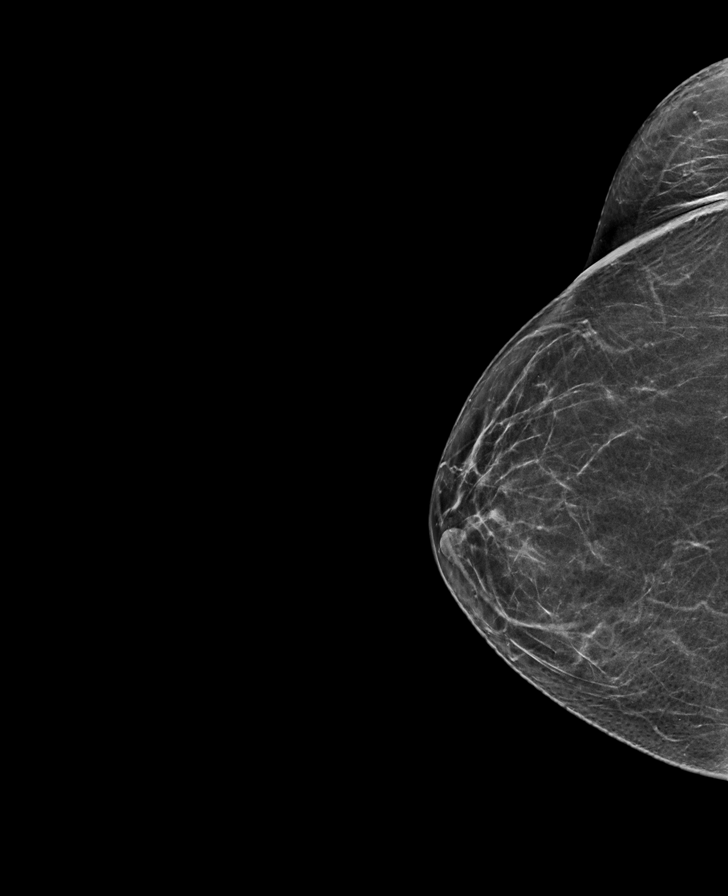

[L CC synth-2D]
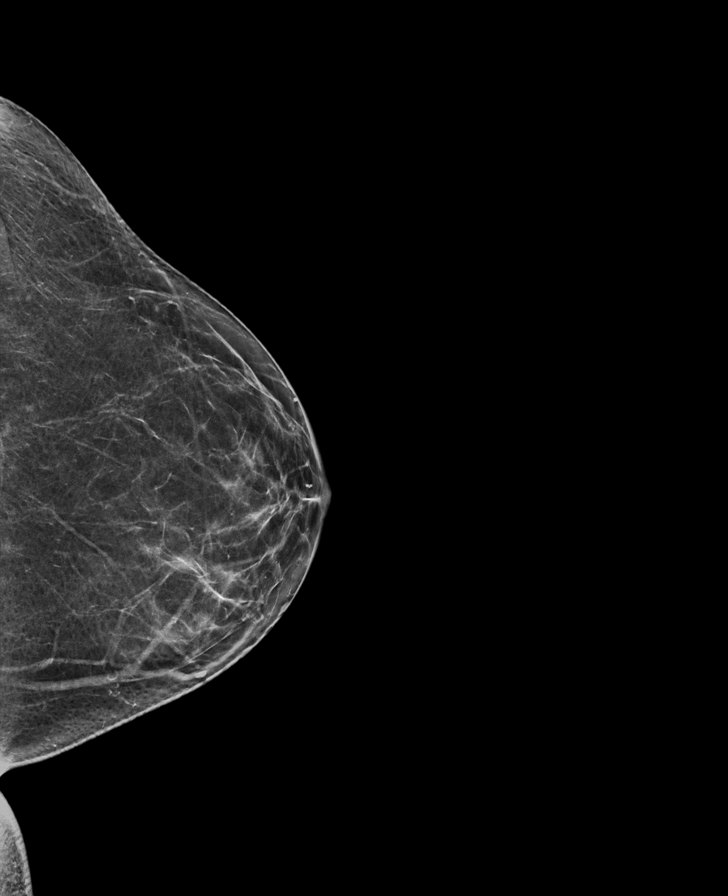

[R MLO synth-2D]
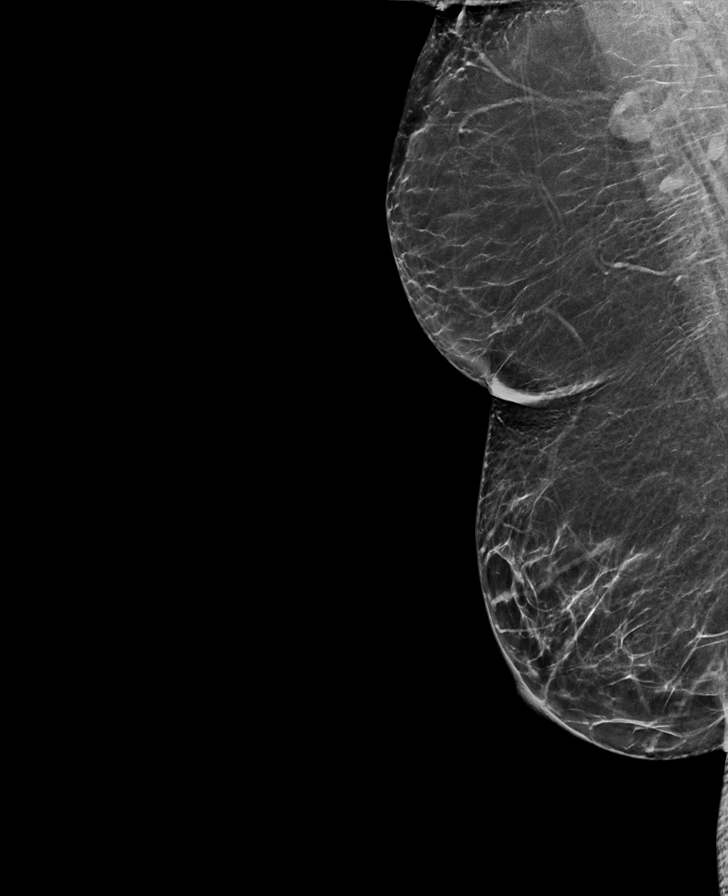

[L MLO synth-2D]
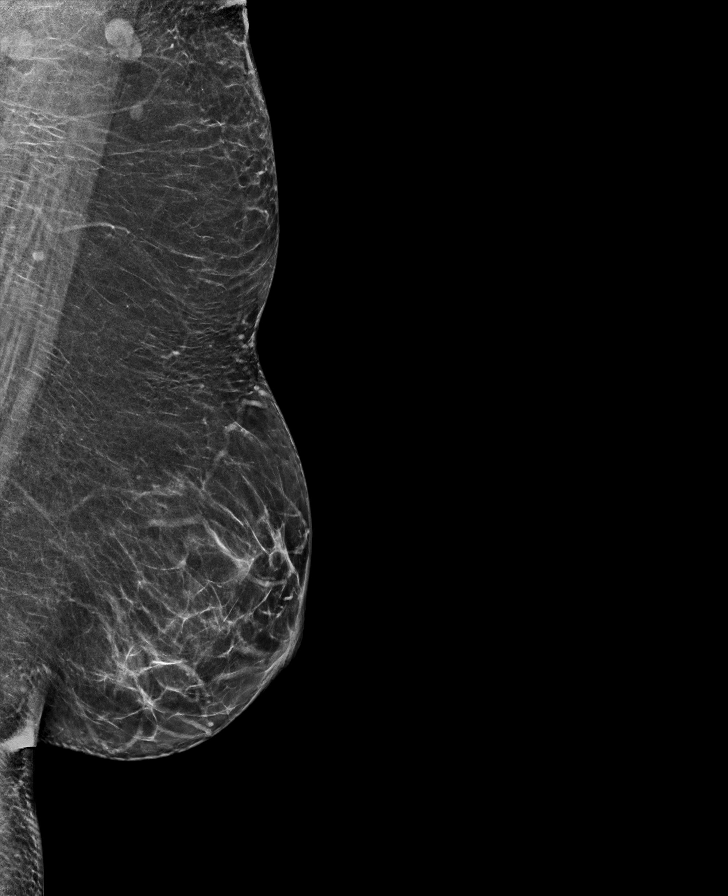

[L MLO tomo · tomo slice 33/66.0]
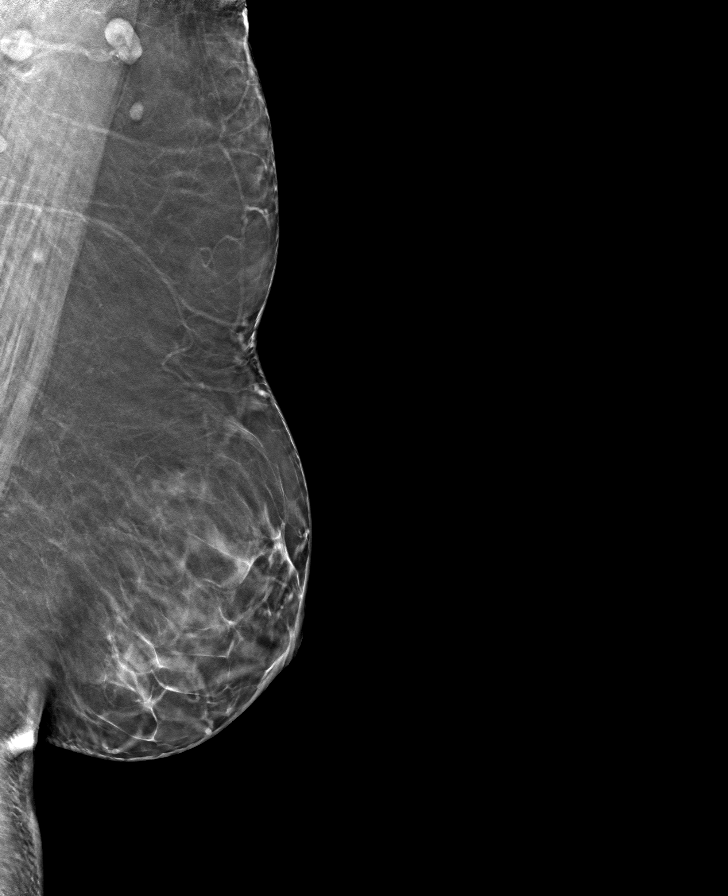

[L CC tomo · tomo slice 31/62.0]
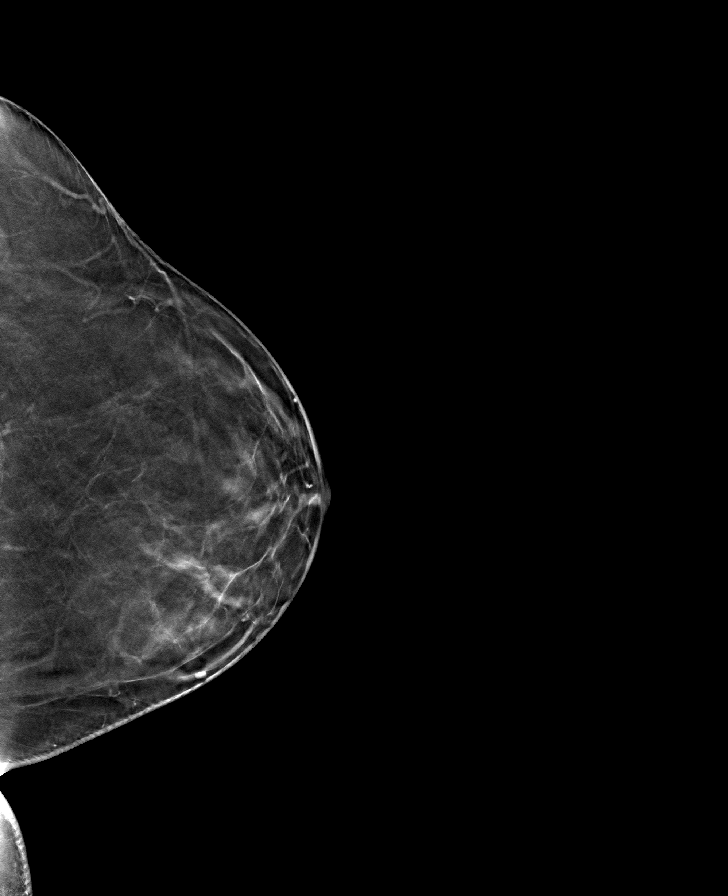

[R CC tomo · tomo slice 31/61.0]
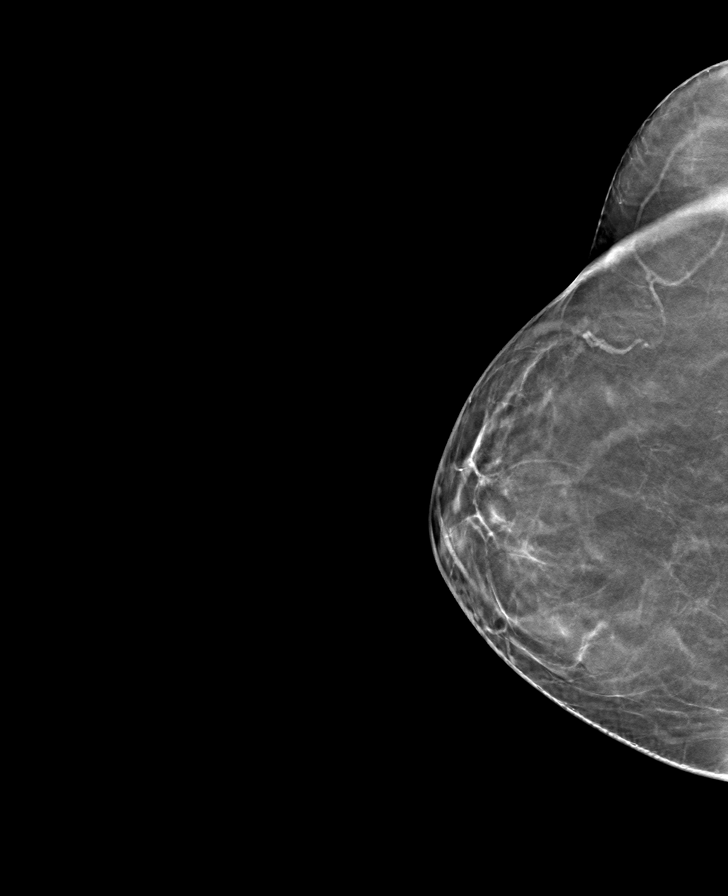

[R MLO tomo · tomo slice 35/68.0]
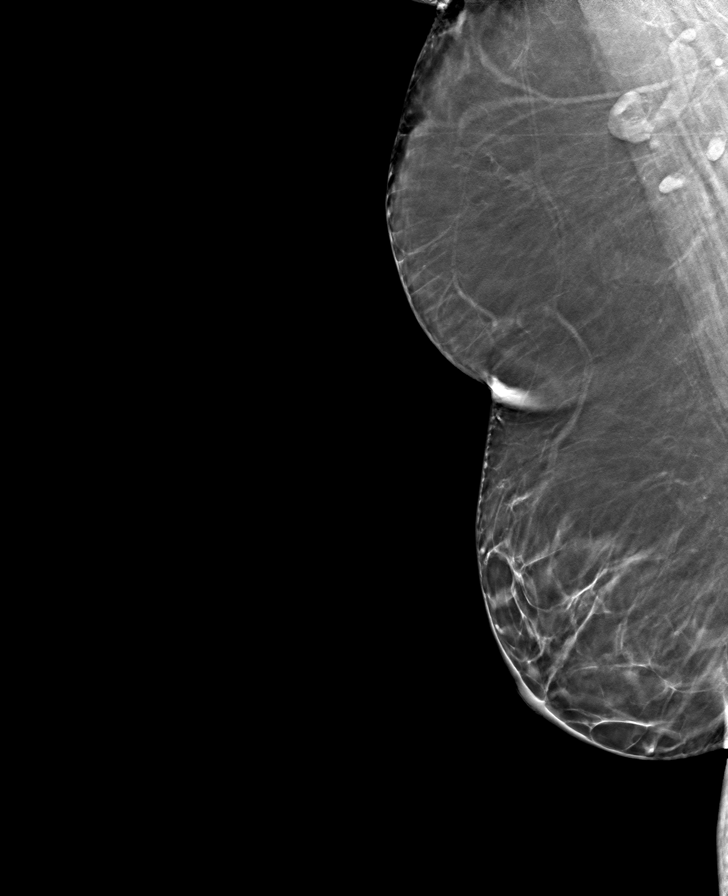

[8 of 24 positions shown; findings below may reference images not displayed]

ACR Breast Density Category b: There are scattered areas of
fibroglandular density.
FINDINGS: There are no findings suspicious for malignancy. Images were
processed with CAD.
IMPRESSION: No mammographic evidence of malignancy. A result letter of this
screening mammogram will be mailed directly to the patient.

RECOMMENDATION:
Screening mammogram in one year. (Code:[id number])

BI-RADS CATEGORY  1: Negative.

## 2022-03-05 ENCOUNTER — Ambulatory Visit: Payer: 59 | Admitting: Emergency Medicine

## 2022-03-05 ENCOUNTER — Encounter: Payer: Self-pay | Admitting: Emergency Medicine

## 2022-03-05 VITALS — BP 126/76 | HR 92 | Temp 99.2°F | Ht 61.0 in | Wt 149.1 lb

## 2022-03-05 DIAGNOSIS — I152 Hypertension secondary to endocrine disorders: Secondary | ICD-10-CM

## 2022-03-05 DIAGNOSIS — E1159 Type 2 diabetes mellitus with other circulatory complications: Secondary | ICD-10-CM

## 2022-03-05 DIAGNOSIS — E785 Hyperlipidemia, unspecified: Secondary | ICD-10-CM

## 2022-03-05 DIAGNOSIS — Z794 Long term (current) use of insulin: Secondary | ICD-10-CM

## 2022-03-05 DIAGNOSIS — H9192 Unspecified hearing loss, left ear: Secondary | ICD-10-CM | POA: Insufficient documentation

## 2022-03-05 DIAGNOSIS — E1165 Type 2 diabetes mellitus with hyperglycemia: Secondary | ICD-10-CM | POA: Diagnosis not present

## 2022-03-05 DIAGNOSIS — L989 Disorder of the skin and subcutaneous tissue, unspecified: Secondary | ICD-10-CM | POA: Diagnosis not present

## 2022-03-05 LAB — POCT GLYCOSYLATED HEMOGLOBIN (HGB A1C): Hemoglobin A1C: 7.2 % — AB (ref 4.0–5.6)

## 2022-03-05 NOTE — Assessment & Plan Note (Signed)
Well-controlled hypertension.  Off medications.  Not taking lisinopril. BP Readings from Last 3 Encounters:  03/05/22 126/76  09/23/21 122/70  11/07/20 112/76  Hemoglobin A1c at 7.2. Sees endocrinologist on a regular basis. On glargine insulin 52 units in the evening.  Also on Trulicity 1.5 mg weekly and metformin 1000 mg twice a day. Diet and nutrition discussed.

## 2022-03-05 NOTE — Assessment & Plan Note (Signed)
Stable.  Diet and nutrition discussed.  Continue rosuvastatin 10 mg daily. The 10-year ASCVD risk score (Arnett DK, et al., 2019) is: 8.2%   Values used to calculate the score:     Age: 56 years     Sex: Female     Is Non-Hispanic African American: No     Diabetic: Yes     Tobacco smoker: No     Systolic Blood Pressure: 126 mmHg     Is BP treated: Yes     HDL Cholesterol: 34 mg/dL     Total Cholesterol: 224 mg/dL

## 2022-03-05 NOTE — Assessment & Plan Note (Signed)
Sebaceous cyst on physical exam.  Dermatology referral placed today.

## 2022-03-05 NOTE — Progress Notes (Signed)
Nicole Villanueva 56 y.o.   Chief Complaint  Patient presents with   Referral    Referral to dermatologist      HISTORY OF PRESENT ILLNESS: This is a 56 y.o. female with history of diabetes and hypertension here for follow-up. Also concerned about decreased hearing on left ear for several months Also has right facial lesion requesting dermatology referral. No other complaints or medical concerns today.  HPI   Prior to Admission medications   Medication Sig Start Date End Date Taking? Authorizing Provider  blood glucose meter kit and supplies Per insurance preference. Test blood glucose three times daily as directed. Dx E11.65, Z79.4 09/27/18  Yes Jacelyn Pi, Lilia Argue, MD  Dulaglutide (TRULICITY) 1.5 RK/2.7CW SOPN Inject 1.5 mg into the skin once a week. 08/02/20  Yes Dareth Andrew, Ines Bloomer, MD  insulin glargine (LANTUS SOLOSTAR) 100 UNIT/ML Solostar Pen Inject 60 Units into the skin daily. 01/23/21  Yes Deloma Spindle, Ines Bloomer, MD  Insulin Pen Needle (CAREFINE PEN NEEDLES) 32G X 4 MM MISC Use 4x a day 09/05/20  Yes Terrina Docter, Ines Bloomer, MD  Lancets (ONETOUCH DELICA PLUS [id number]) [place] USE   TO [place] DAILY 09/05/20  Yes Horald Pollen, MD  metFORMIN (GLUCOPHAGE) 1000 MG tablet TAKE 1 TABLET BY MOUTH TWICE DAILY WITH A MEAL 05/08/20  Yes Jacelyn Pi, Lilia Argue, MD  [facility] VERIO test strip USE  STRIP TO CHECK GLUCOSE THREE TIMES DAILY 01/23/21  Yes Horald Pollen, MD  rosuvastatin (CRESTOR) 10 MG tablet Take 1 tablet (10 mg total) by mouth daily. 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 pelvic organs 06/09/2014   Hyperlipidemia 03/29/2014   Depressive disorder, not elsewhere classified 03/29/2014   Esophageal reflux 03/29/2014   Hypertension associated with diabetes ([place]) 03/12/2012    Past Medical History:  Diagnosis Date   Diabetes mellitus    Hyperlipidemia    Hypertension     Past Surgical History:  Procedure Laterality Date   ABDOMINAL HYSTERECTOMY     CHOLECYSTECTOMY      Social History   Socioeconomic History   Marital status: Single    Spouse name: Not on file   Number of children: 2   Years of education: Not on file   Highest education level: Not on file  Occupational History   Not on file  Tobacco Use   Smoking status: Never   Smokeless tobacco: Never  Vaping Use   Vaping Use: Never used  Substance and Sexual Activity   Alcohol use: No   Drug use: No   Sexual activity: Yes    Birth control/protection: None  Other Topics Concern   Not on file  Social History Narrative   Not on file   Social Determinants of Health   Financial Resource Strain: Not on file  Food Insecurity: Not on file  Transportation Needs: Not on file  Physical Activity: Not on file  Stress: Not on file  Social Connections: Not on file  Intimate Partner Violence: Not on file    Family History  Problem Relation Age of Onset   Diabetes Mother    Diabetes Father      Review of Systems  Constitutional: Negative.  Negative for chills and fever.  HENT:  Positive for hearing loss.   Eyes: Negative.   Respiratory: Negative.  Negative for cough and shortness of breath.   Cardiovascular: Negative.  Negative for chest pain and palpitations.  Gastrointestinal:  Negative for abdominal pain, nausea and vomiting.  Genitourinary: Negative.   Skin:  Positive for rash.  Neurological: Negative.  Negative for  dizziness and headaches.  All other systems reviewed and are negative.  Today's Vitals   03/05/22 1514  BP: 126/76  Pulse: 92  Temp: 99.2 F (37.3 C)  TempSrc: Oral  SpO2: 97%  Weight: 149 lb 2 oz (67.6 kg)  Height: '5\' 1"'  (1.549 m)   Body mass index is 28.18 kg/m.   Physical Exam Vitals reviewed.  Constitutional:      Appearance: Normal appearance.  HENT:     Head: Normocephalic and atraumatic.     Right Ear: Tympanic membrane, ear canal and external ear normal.     Left Ear: Tympanic membrane, ear canal and external ear normal.     Mouth/Throat:     Mouth: Mucous membranes are moist.     Pharynx: Oropharynx is clear.  Eyes:     Extraocular Movements: Extraocular movements intact.     Conjunctiva/sclera: Conjunctivae normal.     Pupils: Pupils are equal, round, and reactive to light.  Cardiovascular:     Rate and Rhythm: Normal rate and regular rhythm.     Pulses: Normal pulses.     Heart sounds: Normal heart sounds.  Pulmonary:     Effort: Pulmonary effort is normal.     Breath sounds: Normal breath sounds.  Musculoskeletal:     Cervical back: No tenderness.     Right lower leg: No edema.     Left lower leg: No edema.  Lymphadenopathy:     Cervical: No cervical adenopathy.  Skin:    General: Skin is warm and dry.     Capillary Refill: Capillary refill takes less than 2 seconds.     Comments: Right cheek shows sebaceous cyst  Neurological:     General: No focal deficit present.     Mental Status: She is alert and oriented to person, place, and time.  Psychiatric:        Mood and Affect: Mood normal.        Behavior: Behavior normal.      ASSESSMENT & PLAN: A total of 47 minutes was spent with the patient and counseling/coordination of care regarding preparing for this visit, review of most recent office visit notes, review of multiple chronic medical problems and their management, review of all medications, education on nutrition, need for ENT and  dermatology referrals, cardiovascular risk associated with diabetes and hypertension, prognosis, documentation, need for follow-up.  Problem List Items Addressed This Visit       Cardiovascular and Mediastinum   Hypertension associated with diabetes ([place]) - Primary    Well-controlled hypertension.  Off medications.  Not taking lisinopril. BP Readings from Last 3 Encounters:  03/05/22 126/76  09/23/21 122/70  11/07/20 112/76  Hemoglobin A1c at 7.2. Sees endocrinologist on a regular basis. On glargine insulin 52 units in the evening.  Also on Trulicity 1.5 mg weekly and metformin 1000 mg twice  a day. Diet and nutrition discussed.         Endocrine   Uncontrolled type 2 diabetes mellitus with hyperglycemia, with long-term current use of insulin ([facility])   Relevant Orders   POCT HgB A1C (Completed)     Nervous and Auditory   Hearing loss of left ear    Has history of chronic exposure to loud noises at work for many years. No abnormal physical findings.  Needs ENT evaluation.      Relevant Orders   Ambulatory referral to ENT     Other   Hyperlipidemia    Stable.  Diet and nutrition discussed.  Continue rosuvastatin 10 mg daily. The 10-year ASCVD risk score (Arnett DK, et al., 2019) is: 8.2%   Values used to calculate the score:     Age: 67 years     Sex: Female     Is Non-Hispanic African American: No     Diabetic: Yes     Tobacco smoker: No     Systolic Blood Pressure: 938 mmHg     Is BP treated: Yes     HDL Cholesterol: 34 mg/dL     Total Cholesterol: 224 mg/dL       Facial lesion    Sebaceous cyst on physical exam.  Dermatology referral placed today.      Relevant Orders   Ambulatory referral to Dermatology   Patient Instructions  Diabetes mellitus y nutricin, en adultos Diabetes Mellitus and Nutrition, Adult Si sufre de diabetes, o diabetes mellitus, es muy importante tener hbitos alimenticios saludables debido a que sus niveles de Designer, television/film set sangre  (glucosa) se ven afectados en gran medida por lo que come y bebe. [place] alimentos saludables en las cantidades correctas, aproximadamente a la misma hora todos los [place], [place] ayudar a: Chief Technology Officer su glucemia. Disminuir el riesgo de sufrir una enfermedad cardaca. Mejorar la presin arterial. Science writer o mantener un peso saludable. Qu puede afectar mi plan de alimentacin? Todas las personas que sufren de diabetes son diferentes y cada una tiene necesidades diferentes en cuanto a un plan de alimentacin. El mdico puede recomendarle que trabaje con un nutricionista para elaborar el mejor plan para usted. Su plan de alimentacin puede variar segn factores como: Las caloras que necesita. Los medicamentos que toma. Su peso. Sus niveles de glucemia, presin arterial y colesterol. Su nivel de [place]. Otras afecciones que tenga, como enfermedades cardacas o renales. Cmo me afectan los carbohidratos? Los carbohidratos, o hidratos de carbono, afectan su nivel de glucemia ms que cualquier otro tipo de alimento. La ingesta de carbohidratos aumenta la cantidad de [employer]. Es importante conocer la cantidad de carbohidratos que se pueden ingerir en cada comida sin correr Engineer, manufacturing. Esto es Psychologist, forensic. Su nutricionista puede ayudarlo a calcular la cantidad de carbohidratos que debe ingerir en cada comida y en cada refrigerio. Cmo me afecta el alcohol? El alcohol puede provocar una disminucin de la glucemia (hipoglucemia), especialmente si [place] insulina o toma determinados medicamentos por va oral para la diabetes. La hipoglucemia es una afeccin potencialmente mortal. Los sntomas de la hipoglucemia, como somnolencia, mareos y confusin, son similares a los sntomas de haber consumido demasiado alcohol. No beba alcohol si: Su mdico le indica no hacerlo. Est embarazada, puede estar embarazada o est tratando de [place]. Si bebe alcohol: Limite la cantidad  que bebe a lo siguiente: De 0 a 1 medida por da para las mujeres. De 0 a 2 medidas por [employer]  para los hombres. Sepa cunta cantidad de alcohol hay en las bebidas que toma. En los [place], una medida equivale a una botella de cerveza de 12 oz (355 ml), un vaso de vino de 5 oz (148 ml) o un vaso de una bebida alcohlica de alta graduacin de 1 oz (44 ml). Mantngase hidratado bebiendo agua, refrescos dietticos o t helado sin azcar. Tenga en cuenta que los refrescos comunes, los jugos y otras bebidas para mezclar pueden contener Freight forwarder y se deben contar como carbohidratos. Consejos para seguir Company secretary las etiquetas de los alimentos Comience por leer el tamao de la porcin en la etiqueta de Informacin nutricional de los alimentos envasados y las bebidas. La cantidad de caloras, carbohidratos, grasas y otros nutrientes detallados en la etiqueta se basan en una porcin del alimento. Muchos alimentos contienen ms de una porcin por envase. Verifique la cantidad total de gramos (g) de carbohidratos totales en una porcin. Verifique la cantidad de gramos de grasas saturadas y grasas trans en una porcin. Escoja alimentos que no contengan estas grasas o que su contenido de estas sea [place]. Verifique la cantidad de miligramos (mg) de sal (sodio) en una porcin. La [employer] de las personas deben limitar la ingesta de sodio total a menos de 2300 mg Honeywell. Siempre consulte la informacin nutricional de los alimentos etiquetados como "con bajo contenido de grasa" o "sin grasa". Estos alimentos pueden tener un mayor contenido de Location manager agregada o carbohidratos refinados, y deben evitarse. Hable con su nutricionista para identificar sus objetivos diarios en cuanto a los nutrientes mencionados en la etiqueta. Al ir de compras Evite comprar alimentos procesados, enlatados o precocidos. Estos alimentos tienden a Special educational needs teacher mayor cantidad de [place], sodio y azcar agregada. Compre en la zona  exterior de la tienda de comestibles. Esta es la zona donde se encuentran con mayor frecuencia las frutas y las verduras frescas, los cereales a granel, las carnes frescas y los productos lcteos frescos. Al cocinar Use mtodos de coccin a baja temperatura, como hornear, en lugar de mtodos de coccin a alta temperatura, como frer en abundante aceite. Cocine con aceites saludables, como el aceite de [place], canola o [place]. Evite cocinar con manteca, crema o carnes con alto contenido de grasa. Planificacin de las comidas Coma las comidas y los refrigerios regularmente, preferentemente a la misma hora todos [place]. Evite pasar largos perodos de tiempo sin comer. Consuma alimentos ricos en fibra, como frutas frescas, verduras, frijoles y cereales integrales. Consuma entre 4 y 6 onzas (entre 112 y 168 g) de protenas magras por da, como carnes [place], pollo, pescado, huevos o tofu. Una onza (oz) (28 g) de protena magra equivale a: 1 onza (28 g) de carne, pollo o pescado. 1 huevo.  taza (62 g) de tofu. Coma algunos alimentos por da que contengan grasas saludables, como aguacates, frutos secos, semillas y pescado. Qu alimentos debo comer? Lambert Mody Bayas. Manzanas. Naranjas. Duraznos. Damascos. Ciruelas. Uvas. Mangos. Papayas. Granadas. Kiwi. Cerezas. Verduras Verduras de Boeing, que incluyen [place], [place], col rizada, acelga, hojas de berza, hojas de mostaza y repollo. Remolachas. Coliflor. Brcoli. Zanahorias. Judas verdes. Tomates. Pimientos. Cebollas. Pepinos. Coles de Bruselas. Granos Granos integrales, como panes, galletas, tortillas, cereales y pastas de salvado o integrales. Avena sin azcar. Quinua. Arroz integral o salvaje. Carnes y otras protenas Frutos de mar. Carne de ave sin piel. Cortes magros de ave y carne de res. Tofu. Frutos secos. Semillas. Lcteos Productos lcteos sin grasa o con  bajo contenido de [place], como [place], yogur y [place]. Es posible que los  productos detallados arriba no constituyan una lista completa de los alimentos y las bebidas que puede tomar. Consulte a un nutricionista para obtener ms informacin. Qu alimentos debo evitar? Lambert Mody Frutas enlatadas al almbar. Verduras Verduras enlatadas. Verduras congeladas con mantequilla o salsa de crema. Granos Productos elaborados con [place] y [place], como panes, pastas, bocadillos y cereales. Evite todos los alimentos procesados. Carnes y otras protenas Cortes de carne con alto contenido de Lobbyist. Carne de ave con piel. Carnes empanizadas o fritas. Carne procesada. Evite las grasas saturadas. Lcteos [place]og[place], [place] enteros. Bebidas Bebidas azucaradas, como gaseosas o t helado. Es posible que los productos que se enumeran ms [place] no constituyan una lista completa de los alimentos y las bebidas que Nurse, adult. Consulte a un nutricionista para obtener ms informacin. Preguntas para hacerle al mdico Debo consultar con un especialista certificado en atencin y educacin sobre la diabetes? Es necesario que me rena con un nutricionista? A qu nmero puedo llamar si tengo preguntas? Cules son los mejores momentos para controlar la glucemia? Dnde encontrar ms informacin: American Diabetes Association (Asociacin Estadounidense de la Diabetes): diabetes.org Academy of Nutrition and Dietetics (Academia de Nutricin y Information systems manager): eatright.Unisys Corporation of Diabetes and Digestive and Kidney Diseases ([place] la Diabetes y [place] y [place]): AmenCredit.is Association of Diabetes Care & Education Specialists (Asociacin de Especialistas en Atencin y Educacin sobre la Diabetes): diabeteseducator.org Resumen Es importante tener hbitos alimenticios saludables debido a que sus niveles de Designer, television/film set sangre (glucosa) se ven afectados en gran medida por lo que come y bebe. Es importante consumir alcohol con  prudencia. Un plan de comidas saludable lo ayudar a controlar la glucosa en sangre y a reducir el riesgo de enfermedades cardacas. El mdico puede recomendarle que trabaje con un nutricionista para elaborar el mejor plan para usted. Esta informacin no tiene Marine scientist el consejo del mdico. Asegrese de hacerle al mdico cualquier pregunta que tenga. Document Revised: 04/18/2020 Document Reviewed: 04/18/2020 Elsevier Patient Education  [place], MD [facility]

## 2022-03-05 NOTE — Patient Instructions (Signed)
Diabetes mellitus y nutricin, en adultos Diabetes Mellitus and Nutrition, Adult Si sufre de diabetes, o diabetes mellitus, es muy importante tener hbitos alimenticios saludables debido a que sus niveles de azcar en la sangre (glucosa) se ven afectados en gran medida por lo que come y bebe. Comer alimentos saludables en las cantidades correctas, aproximadamente a la misma hora todos los das, lo ayudar a: Controlar su glucemia. Disminuir el riesgo de sufrir una enfermedad cardaca. Mejorar la presin arterial. Alcanzar o mantener un peso saludable. Qu puede afectar mi plan de alimentacin? Todas las personas que sufren de diabetes son diferentes y cada una tiene necesidades diferentes en cuanto a un plan de alimentacin. El mdico puede recomendarle que trabaje con un nutricionista para elaborar el mejor plan para usted. Su plan de alimentacin puede variar segn factores como: Las caloras que necesita. Los medicamentos que toma. Su peso. Sus niveles de glucemia, presin arterial y colesterol. Su nivel de actividad. Otras afecciones que tenga, como enfermedades cardacas o renales. Cmo me afectan los carbohidratos? Los carbohidratos, o hidratos de carbono, afectan su nivel de glucemia ms que cualquier otro tipo de alimento. La ingesta de carbohidratos aumenta la cantidad de glucosa en la sangre. Es importante conocer la cantidad de carbohidratos que se pueden ingerir en cada comida sin correr ningn riesgo. Esto es diferente en cada persona. Su nutricionista puede ayudarlo a calcular la cantidad de carbohidratos que debe ingerir en cada comida y en cada refrigerio. Cmo me afecta el alcohol? El alcohol puede provocar una disminucin de la glucemia (hipoglucemia), especialmente si usa insulina o toma determinados medicamentos por va oral para la diabetes. La hipoglucemia es una afeccin potencialmente mortal. Los sntomas de la hipoglucemia, como somnolencia, mareos y confusin, son  similares a los sntomas de haber consumido demasiado alcohol. No beba alcohol si: Su mdico le indica no hacerlo. Est embarazada, puede estar embarazada o est tratando de quedar embarazada. Si bebe alcohol: Limite la cantidad que bebe a lo siguiente: De 0 a 1 medida por da para las mujeres. De 0 a 2 medidas por da para los hombres. Sepa cunta cantidad de alcohol hay en las bebidas que toma. En los [place], una medida equivale a una botella de cerveza de 12 oz (355 ml), un vaso de vino de 5 oz (148 ml) o un vaso de una bebida alcohlica de alta graduacin de 1 oz (44 ml). Mantngase hidratado bebiendo agua, refrescos dietticos o t helado sin azcar. Tenga en cuenta que los refrescos comunes, los jugos y otras bebidas para mezclar pueden contener mucha azcar y se deben contar como carbohidratos. Consejos para seguir este plan  Leer las etiquetas de los alimentos Comience por leer el tamao de la porcin en la etiqueta de Informacin nutricional de los alimentos envasados y las bebidas. La cantidad de caloras, carbohidratos, grasas y otros nutrientes detallados en la etiqueta se basan en una porcin del alimento. Muchos alimentos contienen ms de una porcin por envase. Verifique la cantidad total de gramos (g) de carbohidratos totales en una porcin. Verifique la cantidad de gramos de grasas saturadas y grasas trans en una porcin. Escoja alimentos que no contengan estas grasas o que su contenido de estas sea bajo. Verifique la cantidad de miligramos (mg) de sal (sodio) en una porcin. La mayora de las personas deben limitar la ingesta de sodio total a menos de 2300 mg por da. Siempre consulte la informacin nutricional de los alimentos etiquetados como "con bajo contenido de grasa" o "sin grasa".   Estos alimentos pueden tener un mayor contenido de azcar agregada o carbohidratos refinados, y deben evitarse. Hable con su nutricionista para identificar sus objetivos diarios en  cuanto a los nutrientes mencionados en la etiqueta. Al ir de compras Evite comprar alimentos procesados, enlatados o precocidos. Estos alimentos tienden a tener una mayor cantidad de grasa, sodio y azcar agregada. Compre en la zona exterior de la tienda de comestibles. Esta es la zona donde se encuentran con mayor frecuencia las frutas y las verduras frescas, los cereales a granel, las carnes frescas y los productos lcteos frescos. Al cocinar Use mtodos de coccin a baja temperatura, como hornear, en lugar de mtodos de coccin a alta temperatura, como frer en abundante aceite. Cocine con aceites saludables, como el aceite de oliva, canola o girasol. Evite cocinar con manteca, crema o carnes con alto contenido de grasa. Planificacin de las comidas Coma las comidas y los refrigerios regularmente, preferentemente a la misma hora todos los das. Evite pasar largos perodos de tiempo sin comer. Consuma alimentos ricos en fibra, como frutas frescas, verduras, frijoles y cereales integrales. Consuma entre 4 y 6 onzas (entre 112 y 168 g) de protenas magras por da, como carnes magras, pollo, pescado, huevos o tofu. Una onza (oz) (28 g) de protena magra equivale a: 1 onza (28 g) de carne, pollo o pescado. 1 huevo.  taza (62 g) de tofu. Coma algunos alimentos por da que contengan grasas saludables, como aguacates, frutos secos, semillas y pescado. Qu alimentos debo comer? Frutas Bayas. Manzanas. Naranjas. Duraznos. Damascos. Ciruelas. Uvas. Mangos. Papayas. Granadas. Kiwi. Cerezas. Verduras Verduras de hoja verde, que incluyen lechuga, espinaca, col rizada, acelga, hojas de berza, hojas de mostaza y repollo. Remolachas. Coliflor. Brcoli. Zanahorias. Judas verdes. Tomates. Pimientos. Cebollas. Pepinos. Coles de Bruselas. Granos Granos integrales, como panes, galletas, tortillas, cereales y pastas de salvado o integrales. Avena sin azcar. Quinua. Arroz integral o salvaje. Carnes y otras  protenas Frutos de mar. Carne de ave sin piel. Cortes magros de ave y carne de res. Tofu. Frutos secos. Semillas. Lcteos Productos lcteos sin grasa o con bajo contenido de grasa, como leche, yogur y queso. Es posible que los productos detallados arriba no constituyan una lista completa de los alimentos y las bebidas que puede tomar. Consulte a un nutricionista para obtener ms informacin. Qu alimentos debo evitar? Frutas Frutas enlatadas al almbar. Verduras Verduras enlatadas. Verduras congeladas con mantequilla o salsa de crema. Granos Productos elaborados con harina y harina blanca refinada, como panes, pastas, bocadillos y cereales. Evite todos los alimentos procesados. Carnes y otras protenas Cortes de carne con alto contenido de grasa. Carne de ave con piel. Carnes empanizadas o fritas. Carne procesada. Evite las grasas saturadas. Lcteos Yogur, queso o leche enteros. Bebidas Bebidas azucaradas, como gaseosas o t helado. Es posible que los productos que se enumeran ms arriba no constituyan una lista completa de los alimentos y las bebidas que debe evitar. Consulte a un nutricionista para obtener ms informacin. Preguntas para hacerle al mdico Debo consultar con un especialista certificado en atencin y educacin sobre la diabetes? Es necesario que me rena con un nutricionista? A qu nmero puedo llamar si tengo preguntas? Cules son los mejores momentos para controlar la glucemia? Dnde encontrar ms informacin: American Diabetes Association (Asociacin Estadounidense de la Diabetes): diabetes.org Academy of Nutrition and Dietetics (Academia de Nutricin y Diettica): eatright.org National Institute of Diabetes and Digestive and Kidney Diseases (Instituto Nacional de la Diabetes y las Enfermedades Digestivas y Renales): niddk.nih.gov Association of Diabetes   Care & Education Specialists (Asociacin de Especialistas en Atencin y Educacin sobre la Diabetes):  diabeteseducator.org Resumen Es importante tener hbitos alimenticios saludables debido a que sus niveles de azcar en la sangre (glucosa) se ven afectados en gran medida por lo que come y bebe. Es importante consumir alcohol con prudencia. Un plan de comidas saludable lo ayudar a controlar la glucosa en sangre y a reducir el riesgo de enfermedades cardacas. El mdico puede recomendarle que trabaje con un nutricionista para elaborar el mejor plan para usted. Esta informacin no tiene como fin reemplazar el consejo del mdico. Asegrese de hacerle al mdico cualquier pregunta que tenga. Document Revised: 04/18/2020 Document Reviewed: 04/18/2020 Elsevier Patient Education  2023 Elsevier Inc.  

## 2022-03-05 NOTE — Assessment & Plan Note (Signed)
Has history of chronic exposure to loud noises at work for many years. No abnormal physical findings.  Needs ENT evaluation.

## 2022-04-21 ENCOUNTER — Ambulatory Visit: Payer: 59 | Admitting: Emergency Medicine

## 2022-09-08 ENCOUNTER — Ambulatory Visit: Payer: 59 | Admitting: Emergency Medicine

## 2022-09-10 ENCOUNTER — Ambulatory Visit: Payer: 59 | Admitting: Emergency Medicine

## 2022-10-15 ENCOUNTER — Ambulatory Visit: Payer: 59 | Admitting: Emergency Medicine

## 2022-11-14 ENCOUNTER — Telehealth: Payer: Self-pay | Admitting: Emergency Medicine

## 2022-11-14 DIAGNOSIS — E1165 Type 2 diabetes mellitus with hyperglycemia: Secondary | ICD-10-CM

## 2022-11-14 MED ORDER — TRULICITY 1.5 MG/0.5ML ~~LOC~~ SOAJ: 1.5000 mg | SUBCUTANEOUS | 3 refills | Status: DC

## 2022-11-14 NOTE — Telephone Encounter (Signed)
Prescription Request  11/14/2022  LOV: 03/05/2022  What is the name of the medication or equipment?  Dulaglutide (TRULICITY) 1.5 0000000   Have you contacted your pharmacy to request a refill? No   Which pharmacy would you like this sent to?  [facility], [place] [address] [place] [address] Phone: [phone] Fax: [phone]    Patient notified that their request is being sent to the clinical staff for review and that they should receive a response within 2 business days.   Please advise at Mobile [phone] (mobile)

## 2022-11-14 NOTE — Telephone Encounter (Signed)
Sent refill to [facility] 

## 2022-11-17 ENCOUNTER — Ambulatory Visit: Payer: 59 | Admitting: Emergency Medicine

## 2022-11-17 NOTE — Progress Notes (Unsigned)
Davey Mullenax 57 y.o.   No chief complaint on file.   HISTORY OF PRESENT ILLNESS: This is a 57 y.o. female complaining of ***. ***.  HPI   Prior to Admission medications   Medication Sig Start Date End Date Taking? Authorizing Provider  blood glucose meter kit and supplies Per insurance preference. Test blood glucose three times daily as directed. Dx E11.65, Z79.4 09/27/18   Jacelyn Pi, Lilia Argue, MD  Dulaglutide (TRULICITY) 1.5 0000000 SOPN Inject 1.5 mg into the skin once a week. 11/14/22   Horald Pollen, MD  insulin glargine (LANTUS SOLOSTAR) 100 UNIT/ML Solostar Pen Inject 60 Units into the skin daily. 01/23/21   Horald Pollen, MD  Insulin Pen Needle (CAREFINE PEN NEEDLES) 32G X 4 MM MISC Use 4x a day 09/05/20   Horald Pollen, MD  Lancets [facility] DELICA PLUS [id number]) [place] USE   TO [place] DAILY 09/05/20   Horald Pollen, MD  lisinopril (ZESTRIL) 5 MG tablet Take 1 tablet by mouth once daily Patient not taking: Reported on 03/05/2022 05/08/20   Jacelyn Pi, Lilia Argue, MD  metFORMIN (GLUCOPHAGE) 1000 MG tablet TAKE 1 TABLET BY MOUTH TWICE DAILY WITH A MEAL 05/08/20   Jacelyn Pi, Lilia Argue, MD  [facility] VERIO test strip USE  STRIP TO CHECK GLUCOSE THREE TIMES DAILY 01/23/21   Horald Pollen, MD  rosuvastatin (CRESTOR) 10 MG tablet Take 1 tablet (10 mg total) by mouth daily. 08/02/20   Horald Pollen, MD    Allergies  Allergen Reactions   Lisinopril Hives    Patient Active Problem List   Diagnosis Date Noted   Hearing loss of left ear 03/05/2022   Facial lesion 03/05/2022   Uncontrolled type 2 diabetes mellitus with hyperglycemia, with long-term current use of insulin ([place]) 10/09/2016   Diabetic retinopathy ([place]) 06/13/2016   Uterovaginal prolapse 11/03/2014   Prolapse of anterior vaginal wall 11/03/2014   Prolapse of female pelvic organs 06/09/2014   Hyperlipidemia 03/29/2014   Depressive disorder, not elsewhere  classified 03/29/2014   Esophageal reflux 03/29/2014   Hypertension associated with diabetes ([place]) 03/12/2012    Past Medical History:  Diagnosis Date   Diabetes mellitus    Hyperlipidemia    Hypertension     Past Surgical History:  Procedure Laterality Date   ABDOMINAL HYSTERECTOMY     CHOLECYSTECTOMY      Social History   Socioeconomic History   Marital status: Single    Spouse name: Not on file   Number of children: 2   Years of education: Not on file   Highest education level: Not on file  Occupational History   Not on file  Tobacco Use   Smoking status: Never   Smokeless tobacco: Never  Vaping Use   Vaping Use: Never used  Substance and Sexual Activity   Alcohol use: No   Drug use: No   Sexual activity: Yes    Birth control/protection: None  Other Topics Concern   Not on file  Social History Narrative   Not on file   Social Determinants of Health   Financial Resource Strain: Not on file  Food Insecurity: Not on file  Transportation Needs: Not on file  Physical Activity: Not on file  Stress: Not on file  Social Connections: Not on file  Intimate Partner Violence: Not on file    Family History  Problem Relation Age of Onset   Diabetes Mother    Diabetes Father  ROS  There were no vitals filed for this visit.  Physical Exam   ASSESSMENT & PLAN: Problem List Items Addressed This Visit   None    Agustina Caroli, MD [facility]

## 2023-02-19 ENCOUNTER — Ambulatory Visit: Payer: 59 | Admitting: Emergency Medicine

## 2023-10-07 ENCOUNTER — Encounter: Payer: Self-pay | Admitting: Emergency Medicine

## 2023-10-07 ENCOUNTER — Ambulatory Visit: Payer: 59 | Admitting: Emergency Medicine

## 2023-10-07 VITALS — BP 110/72 | HR 107 | Temp 99.8°F | Ht 61.0 in | Wt 139.0 lb

## 2023-10-07 DIAGNOSIS — R6889 Other general symptoms and signs: Secondary | ICD-10-CM | POA: Diagnosis not present

## 2023-10-07 DIAGNOSIS — R051 Acute cough: Secondary | ICD-10-CM | POA: Diagnosis not present

## 2023-10-07 DIAGNOSIS — J101 Influenza due to other identified influenza virus with other respiratory manifestations: Secondary | ICD-10-CM | POA: Insufficient documentation

## 2023-10-07 LAB — POC COVID19 BINAXNOW: SARS Coronavirus 2 Ag: NEGATIVE

## 2023-10-07 LAB — POCT INFLUENZA A/B
Influenza A, POC: POSITIVE — AB
Influenza B, POC: NEGATIVE

## 2023-10-07 MED ORDER — HYDROCODONE BIT-HOMATROP MBR 5-1.5 MG/5ML PO SOLN: 5.0000 mL | Freq: Every evening | ORAL | 0 refills | Status: DC | PRN

## 2023-10-07 MED ORDER — BENZONATATE 200 MG PO CAPS: 200.0000 mg | ORAL_CAPSULE | Freq: Two times a day (BID) | ORAL | 0 refills | Status: DC | PRN

## 2023-10-07 NOTE — Assessment & Plan Note (Signed)
Symptom management discussed Advised to take Tylenol and or Advil for headaches and generalized achiness Cough management discussed Advised to rest and stay well-hydrated

## 2023-10-07 NOTE — Patient Instructions (Signed)
Gripe en los adultos Influenza, Adult A la gripe tambin se la conoce como influenza. Es una infeccin que afecta las vas respiratorias. Estas incluyen la nariz, la garganta, la trquea y los pulmones. La gripe es contagiosa. Esto significa que se transmite fcilmente de [place] persona a otra. Causa sntomas que son como un resfro. Tambin puede provocar fiebre alta y dolores corporales. Cules son las causas? La gripe es causada por el virus de la influenza. Puede contraerlo de las siguientes maneras: Al inhalar las gotitas que quedan en el aire despus de que una persona infectada tose o estornuda. Al tocar algo que est contaminado con el virus y [employer] mano a la boca, la nariz o los ojos. Qu incrementa el riesgo? Puede ser ms propenso a contraer gripe si: No se lava las manos con frecuencia. Est cerca de Yahoo durante la temporada de resfro y gripe. Se toca la boca, los ojos o la nariz sin antes [employer]. No recibe la vacuna antigripal todos los aos. Tambin puede correr un mayor riesgo de Warehouse manager gripe y [place] graves, como una infeccin pulmonar llamada neumona, si: Tiene ms de 65 aos. Est embarazada. Su sistema inmunitario est dbil. Su sistema inmunitario es [address] de defensa de su cuerpo. Tiene una afeccin a largo plazo, o crnica, como: Enfermedad pulmonar, cardaca o renal. Diabetes. Un trastorno del hgado. Asma. Tiene sobrepeso. Tiene anemia. Esto ocurre cuando no tiene suficientes glbulos rojos en el cuerpo. Cules son los signos o sntomas? Los sntomas de la gripe Theatre stage manager de repente. Pueden durar entre 4 y [address] e incluir lo siguiente: Grant Ruts y escalofros. Dolores de [place], dolores en el cuerpo o dolores musculares. Dolor de Advertising copywriter. Tos. Secrecin o congestin nasal. Molestias en el pecho. No querer comer tanto como lo hace normalmente. Sensacin de debilidad o cansancio. Sensacin de [employer]. Nuseas o  vmitos. Cmo se diagnostica? La gripe puede diagnosticarse en funcin de los sntomas y los antecedentes mdicos. Tambin pueden hacerle un examen fsico. Es posible que le hagan un hisopado de la nariz o la garganta para detectar el virus. Cmo se trata? Si la gripe se detecta de forma temprana, puede recibir tratamiento con medicamentos antivirales. Estos se pueden administrar por boca o a travs de una va intravenosa (i.v.). Pueden ayudarlo a sentirse menos enfermo y a mejorar ms rpido. Cuidarse en su hogar tambin puede ayudar a que mejoren sus sntomas. El mdico puede indicarle que: Tome medicamentos de [place]. Beba mucho lquido. La gripe suele desaparecer sola. Si tiene sntomas muy graves o problemas provocados por la gripe, es posible que necesite recibir tratamiento en un hospital. Siga estas instrucciones en su casa: Actividad Descanse todo lo que sea necesario. Duerma mucho. Lanny Hurst en su casa y no concurra al Aleen Campi o a la escuela, como se lo haya indicado su mdico. Salga de su casa solo para ir al mdico. No salga de su casa por otros motivos hasta que no haya tenido fiebre por 24 horas sin tomar medicamentos. Comida y bebida Tome una solucin de rehidratacin oral (oral rehydration solution, ORS). Es Neomia Dear bebida que se vende en farmacias y tiendas. Beba suficiente lquido como para Pharmacologist la orina de color amarillo plido. Trate de beber pequeas cantidades de lquidos claros. Estos incluyen agua, cubitos de hielo, jugo de fruta rebajado con agua y bebidas deportivas bajas en caloras. Trate de comer alimentos suaves que sean fciles de digerir. Estos incluyen bananas, compota de [place], arroz, carnes [place],  tostadas y [employer]. Evite las bebidas con alto contenido de azcar o cafena. Estas incluyen las bebidas energticas, las bebidas deportivas comunes y los refrescos. No beba alcohol. No coma alimentos grasos o muy condimentados. Instrucciones  generales     Use los medicamentos solamente como se lo haya indicado el mdico. Use un humidificador de aire fro para que el aire de su casa est ms hmedo. Esto puede facilitar la respiracin. Tambin debe Systems developer [employer]. Para ello: Vace el agua. Vierta agua limpia. Al toser o estornudar, cbrase la boca y la [place]. Lvese las manos frecuentemente con agua y [place] y durante al menos 20 segundos. Es sumamente importante que lo haga despus de toser o Engineering geologist. Si no dispone de [place] y [place], use un desinfectante para manos. Cmo se previene?  Colquese la vacuna antigripal todos los [place]. Pregntele al mdico cundo debe recibir la vacuna contra la gripe. Mantngase alejado de las personas que estn enfermas durante el otoo y el invierno. El otoo y el invierno son la temporada de los resfros y Emergency planning/management officer. Comunquese con un mdico si: Tiene sntomas nuevos. Tiene dolor en el pecho. Tiene heces lquidas, tambin llamado diarrea. Tiene fiebre. La tos empeora. Empieza a tener ms mucosidad. Tiene ganas de vomitar o vomita. Solicite ayuda de inmediato si: Le falta el aire o tiene problemas para respirar. Observa que la piel o las uas estn [place]. Presenta un dolor intenso o rigidez en el cuello. Tiene un dolor de cabeza repentino o tiene dolor en la cara o el odo. Vomita cada vez que come o bebe. Estos sntomas pueden Customer service manager. Llame al 911 de inmediato. No espere a ver si los sntomas desaparecen. No conduzca por sus propios medios [employer]. Esta informacin no tiene Theme park manager el consejo del mdico. Asegrese de hacerle al mdico cualquier pregunta que tenga. Document Revised: 11/20/2022 Document Reviewed: 09/20/2022 Elsevier Patient Education  2024 [employer].

## 2023-10-07 NOTE — Assessment & Plan Note (Signed)
Started 5 days ago.  Outside the window  for antiviral medication. Running its course without complications No red flag signs or symptoms Symptom management discussed Recommend no work until next Monday Advised to rest and stay well-hydrated ED precautions given Advised to contact the office if no better or worse during the next several days

## 2023-10-07 NOTE — Assessment & Plan Note (Signed)
Cough management discussed Advised to take Mucinex DM and cough drops Tessalon 200 mg 3 times a day and Hycodan syrup as needed Advised to rest and stay well-hydrated

## 2023-10-07 NOTE — Progress Notes (Signed)
Nicole Villanueva 58 y.o.   Chief Complaint  Patient presents with   Cough    Patient states started Saturday with slight symptoms of body chills and then Monday is when the cough, chest pain from the cough, sore throat, chills, no fevers. Tested herself for covid on Monday night and was negative     HISTORY OF PRESENT ILLNESS: This is a 58 y.o. female complaining of flulike symptoms that started 5 days ago. Coworker was also sick with similar symptoms last week. Mostly complaining of dry cough, body aches and chills, sore throat. COVID test at home negative. No other complaints or medical concerns today.  Cough Associated symptoms include chills and a sore throat. Pertinent negatives include no chest pain, fever, headaches, rash or shortness of breath.     Prior to Admission medications   Medication Sig Start Date End Date Taking? Authorizing Provider  blood glucose meter kit and supplies Per insurance preference. Test blood glucose three times daily as directed. Dx E11.65, Z79.4 09/27/18  Yes Lezlie Lye, Meda Coffee, MD  Dulaglutide (TRULICITY) 1.5 MG/0.5ML SOPN Inject 1.5 mg into the skin once a week. 11/14/22  Yes Chanse Kagel, Eilleen Kempf, MD  insulin glargine (LANTUS SOLOSTAR) 100 UNIT/ML Solostar Pen Inject 60 Units into the skin daily. 01/23/21  Yes Keydi Giel, Eilleen Kempf, MD  Insulin Pen Needle (CAREFINE PEN NEEDLES) 32G X 4 MM MISC Use 4x a day 09/05/20  Yes Alissia Lory, Eilleen Kempf, MD  Lancets (ONETOUCH DELICA PLUS [place]) MISC USE   TO CHECK GLUCOSE THREE TIMES DAILY 09/05/20  Yes Georgina Quint, MD  metFORMIN (GLUCOPHAGE) 1000 MG tablet TAKE 1 TABLET BY MOUTH TWICE DAILY WITH A MEAL 05/08/20  Yes Lezlie Lye, Meda Coffee, MD  [facility] VERIO test strip USE  STRIP TO CHECK GLUCOSE THREE TIMES DAILY 01/23/21  Yes Georgina Quint, MD  rosuvastatin (CRESTOR) 10 MG tablet Take 1 tablet (10 mg total) by mouth daily. 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   Facial lesion 03/05/2022   Uncontrolled type 2 diabetes mellitus with hyperglycemia, with long-term current use of insulin (HCC) 10/09/2016   Diabetic retinopathy (HCC) 06/13/2016   Uterovaginal prolapse 11/03/2014   Prolapse of anterior vaginal wall 11/03/2014   Prolapse of female pelvic organs 06/09/2014   Hyperlipidemia 03/29/2014   Depressive disorder, not elsewhere classified 03/29/2014   Esophageal reflux 03/29/2014   Hypertension associated with diabetes (HCC) 03/12/2012    Past Medical History:  Diagnosis Date   Diabetes mellitus    Hyperlipidemia    Hypertension     Past Surgical History:  Procedure Laterality Date   ABDOMINAL HYSTERECTOMY     CHOLECYSTECTOMY      Social History   Socioeconomic History   Marital status: Single    Spouse name: Not on file   Number of children: 2   Years of education: Not on file   Highest education level: Not on file  Occupational History   Not on file  Tobacco Use   Smoking status: Never   Smokeless tobacco: Never  Vaping Use   Vaping status: Never Used  Substance and Sexual Activity   Alcohol use: No  Drug use: No   Sexual activity: Yes    Birth control/protection: None  Other Topics Concern   Not on file  Social History Narrative   Not on file   Social Drivers of Health   Financial Resource Strain: Not on file  Food Insecurity: No Food Insecurity (10/10/2021)   [facility] from [facility], [facility]   Hunger Vital Sign    Worried About Running Out of Food in the Last Year: Never true    Ran Out of Food in the Last Year: Never true  Transportation Needs: Not on file  Physical Activity: Not on file  Stress: Not on file  Social  Connections: Unknown (12/23/2021)   Received from [facility]   Social Network    Social Network: Not on file  Intimate Partner Violence: Unknown (11/26/2021)   Received from [facility]   HITS    Physically Hurt: Not on file    Insult or Talk Down To: Not on file    Threaten Physical Harm: Not on file    Scream or Curse: Not on file    Family History  Problem Relation Age of Onset   Diabetes Mother    Diabetes Father      Review of Systems  Constitutional:  Positive for chills. Negative for fever.  HENT:  Positive for congestion and sore throat.   Respiratory:  Positive for cough. Negative for shortness of breath.   Cardiovascular: Negative.  Negative for chest pain and palpitations.  Gastrointestinal:  Negative for abdominal pain, diarrhea, nausea and vomiting.  Genitourinary: Negative.  Negative for dysuria and hematuria.  Skin: Negative.  Negative for rash.  Neurological: Negative.  Negative for dizziness and headaches.  All other systems reviewed and are negative.   Vitals:   10/07/23 1400  BP: 110/72  Pulse: (!) 107  Temp: 99.8 F (37.7 C)  SpO2: 95%    Physical Exam Vitals reviewed.  Constitutional:      Appearance: Normal appearance.  HENT:     Head: Normocephalic.     Right Ear: Tympanic membrane, ear canal and external ear normal.     Left Ear: Tympanic membrane, ear canal and external ear normal.     Mouth/Throat:     Mouth: Mucous membranes are moist.     Pharynx: Oropharynx is clear.  Eyes:     Extraocular Movements: Extraocular movements intact.     Conjunctiva/sclera: Conjunctivae normal.     Pupils: Pupils are equal, round, and reactive to light.  Cardiovascular:     Rate and Rhythm: Normal rate and regular rhythm.     Pulses: Normal pulses.     Heart sounds: Normal heart sounds.  Pulmonary:     Effort: Pulmonary effort is normal.     Breath sounds: Normal breath sounds.  Musculoskeletal:     Cervical back: No tenderness.   Lymphadenopathy:     Cervical: No cervical adenopathy.  Skin:    General: Skin is warm and dry.     Capillary Refill: Capillary refill takes less than 2 seconds.  Neurological:     Mental Status: She is alert and oriented to person, place, and time.  Psychiatric:        Mood and Affect: Mood normal.        Behavior: Behavior normal.    Results for orders placed or performed in visit on 10/07/23 (from the past 24 hours)  POC COVID-19     Status: None   Collection Time: 10/07/23  2:33  PM  Result Value Ref Range   SARS Coronavirus 2 Ag Negative Negative  POCT Influenza A/B     Status: Abnormal   Collection Time: 10/07/23  2:33 PM  Result Value Ref Range   Influenza A, POC Positive (A) Negative   Influenza B, POC Negative Negative     ASSESSMENT & PLAN: A total of 33 minutes was spent with the patient and counseling/coordination of care regarding preparing for this visit, review of most recent office visit notes, review of multiple chronic medical conditions under management, review of all medications, diagnosis of influenza and symptom management, prognosis, ED precautions, documentation and need for follow-up if no better or worse during the next several days  Problem List Items Addressed This Visit       Respiratory   Influenza A - Primary   Started 5 days ago.  Outside the window  for antiviral medication. Running its course without complications No red flag signs or symptoms Symptom management discussed Recommend no work until next Monday Advised to rest and stay well-hydrated ED precautions given Advised to contact the office if no better or worse during the next several days        Other   Acute cough   Cough management discussed Advised to take Mucinex DM and cough drops Tessalon 200 mg 3 times a day and Hycodan syrup as needed Advised to rest and stay well-hydrated      Relevant Medications   benzonatate (TESSALON) 200 MG capsule   HYDROcodone  bit-homatropine (HYCODAN) 5-1.5 MG/5ML syrup   Flu-like symptoms   Symptom management discussed Advised to take Tylenol and or Advil for headaches and generalized achiness Cough management discussed Advised to rest and stay well-hydrated      Relevant Orders   POC COVID-19 (Completed)   POCT Influenza A/B (Completed)   Patient Instructions  Gripe en los adultos Influenza, Adult A la gripe tambin se la conoce como influenza. Es una infeccin que afecta las vas respiratorias. Estas incluyen la nariz, la garganta, la trquea y los pulmones. La gripe es contagiosa. Esto significa que se transmite fcilmente de [place] persona a otra. Causa sntomas que son como un resfro. Tambin puede provocar fiebre alta y dolores corporales. Cules son las causas? La gripe es causada por el virus de la influenza. Puede contraerlo de las siguientes maneras: Al inhalar las gotitas que quedan en el aire despus de que una persona infectada tose o estornuda. Al tocar algo que est contaminado con el virus y [employer] mano a la boca, la nariz o los ojos. Qu incrementa el riesgo? Puede ser ms propenso a contraer gripe si: No se lava las manos con frecuencia. Est cerca de [employer] durante la temporada de resfro y gripe. Se toca la boca, los ojos o la nariz sin antes [employer]. No recibe la vacuna antigripal todos los aos. Tambin puede correr un mayor riesgo de Warehouse manager gripe y [place] graves, como una infeccin pulmonar llamada neumona, si: Tiene ms de 65 aos. Est embarazada. Su sistema inmunitario est dbil. Su sistema inmunitario es [address] de defensa de su cuerpo. Tiene una afeccin a largo plazo, o crnica, como: Enfermedad pulmonar, cardaca o renal. Diabetes. Un trastorno del hgado. Asma. Tiene sobrepeso. Tiene anemia. Esto ocurre cuando no tiene suficientes glbulos rojos en el cuerpo. Cules son los signos o sntomas? Los sntomas de la gripe Occupational psychologist de repente. Pueden durar entre 4 y [address] e incluir lo siguiente: Grant Ruts  y escalofros. Dolores de [place], dolores en el cuerpo o dolores musculares. Dolor de Advertising copywriter. Tos. Secrecin o congestin nasal. Molestias en el pecho. No querer comer tanto como lo hace normalmente. Sensacin de debilidad o cansancio. Sensacin de [employer]. Nuseas o vmitos. Cmo se diagnostica? La gripe puede diagnosticarse en funcin de los sntomas y los antecedentes mdicos. Tambin pueden hacerle un examen fsico. Es posible que le hagan un hisopado de la nariz o la garganta para detectar el virus. Cmo se trata? Si la gripe se detecta de forma temprana, puede recibir tratamiento con medicamentos antivirales. Estos se pueden administrar por boca o a travs de una va intravenosa (i.v.). Pueden ayudarlo a sentirse menos enfermo y a mejorar ms rpido. Cuidarse en su hogar tambin puede ayudar a que mejoren sus sntomas. El mdico puede indicarle que: Tome medicamentos de [place]. Beba mucho lquido. La gripe suele desaparecer sola. Si tiene sntomas muy graves o problemas provocados por la gripe, es posible que necesite recibir tratamiento en un hospital. Siga estas instrucciones en su casa: Actividad Descanse todo lo que sea necesario. Duerma mucho. Lanny Hurst en su casa y no concurra al Aleen Campi o a la escuela, como se lo haya indicado su mdico. Salga de su casa solo para ir al mdico. No salga de su casa por otros motivos hasta que no haya tenido fiebre por 24 horas sin tomar medicamentos. Comida y bebida Tome una solucin de rehidratacin oral (oral rehydration solution, ORS). Es Neomia Dear bebida que se vende en farmacias y tiendas. Beba suficiente lquido como para Pharmacologist la orina de color amarillo plido. Trate de beber pequeas cantidades de lquidos claros. Estos incluyen agua, cubitos de hielo, jugo de fruta rebajado con agua y bebidas deportivas bajas en caloras. Trate de comer alimentos  suaves que sean fciles de digerir. Estos incluyen bananas, compota de [place], arroz, carnes [place], tostadas y [address] Evite las bebidas con alto contenido de azcar o cafena. Estas incluyen las bebidas energticas, las bebidas deportivas comunes y los refrescos. No beba alcohol. No coma alimentos grasos o muy condimentados. Instrucciones generales     Use los medicamentos solamente como se lo haya indicado el mdico. Use un humidificador de aire fro para que el aire de su casa est ms hmedo. Esto puede facilitar la respiracin. Tambin debe Systems developer [employer]. Para ello: Vace el agua. Vierta agua limpia. Al toser o estornudar, cbrase la boca y la [place]. Lvese las manos frecuentemente con agua y [place] y durante al menos 20 segundos. Es sumamente importante que lo haga despus de toser o Engineering geologist. Si no dispone de [place] y [place], use un desinfectante para manos. Cmo se previene?  Colquese la vacuna antigripal todos los [place]. Pregntele al mdico cundo debe recibir la vacuna contra la gripe. Mantngase alejado de las personas que estn enfermas durante el otoo y el invierno. El otoo y el invierno son la temporada de los resfros y Emergency planning/management officer. Comunquese con un mdico si: Tiene sntomas nuevos. Tiene dolor en el pecho. Tiene heces lquidas, tambin llamado diarrea. Tiene fiebre. La tos empeora. Empieza a tener ms mucosidad. Tiene ganas de vomitar o vomita. Solicite ayuda de inmediato si: Le falta el aire o tiene problemas para respirar. Observa que la piel o las uas estn [place]. Presenta un dolor intenso o rigidez en el cuello. Tiene un dolor de cabeza repentino o tiene dolor en la cara o el odo. Vomita cada vez que come o bebe. Estos sntomas pueden indicar una  emergencia. Llame al 911 de inmediato. No espere a ver si los sntomas desaparecen. No conduzca por sus propios medios [employer]. Esta informacin no tiene Microbiologist el consejo del mdico. Asegrese de hacerle al mdico cualquier pregunta que tenga. Document Revised: 11/20/2022 Document Reviewed: 09/20/2022 Elsevier Patient Education  2024 Elsevier Inc.  Edwina Barth, MD [facility]

## 2024-04-28 ENCOUNTER — Encounter: Payer: Self-pay | Admitting: Emergency Medicine

## 2024-04-28 ENCOUNTER — Ambulatory Visit: Admitting: Emergency Medicine

## 2024-04-28 ENCOUNTER — Ambulatory Visit: Payer: Self-pay | Admitting: Emergency Medicine

## 2024-04-28 VITALS — BP 118/74 | HR 63 | Temp 98.2°F | Ht 61.0 in | Wt 137.0 lb

## 2024-04-28 DIAGNOSIS — Z0000 Encounter for general adult medical examination without abnormal findings: Secondary | ICD-10-CM

## 2024-04-28 DIAGNOSIS — Z1211 Encounter for screening for malignant neoplasm of colon: Secondary | ICD-10-CM

## 2024-04-28 DIAGNOSIS — E1159 Type 2 diabetes mellitus with other circulatory complications: Secondary | ICD-10-CM

## 2024-04-28 DIAGNOSIS — Z7985 Long-term (current) use of injectable non-insulin antidiabetic drugs: Secondary | ICD-10-CM

## 2024-04-28 DIAGNOSIS — Z13228 Encounter for screening for other metabolic disorders: Secondary | ICD-10-CM

## 2024-04-28 DIAGNOSIS — I152 Hypertension secondary to endocrine disorders: Secondary | ICD-10-CM

## 2024-04-28 DIAGNOSIS — Z7984 Long term (current) use of oral hypoglycemic drugs: Secondary | ICD-10-CM

## 2024-04-28 DIAGNOSIS — R1319 Other dysphagia: Secondary | ICD-10-CM | POA: Diagnosis not present

## 2024-04-28 DIAGNOSIS — Z1329 Encounter for screening for other suspected endocrine disorder: Secondary | ICD-10-CM | POA: Diagnosis not present

## 2024-04-28 DIAGNOSIS — E1169 Type 2 diabetes mellitus with other specified complication: Secondary | ICD-10-CM

## 2024-04-28 DIAGNOSIS — Z130 Encounter for screening for diseases of the blood and blood-forming organs and certain disorders involving the immune mechanism: Secondary | ICD-10-CM

## 2024-04-28 DIAGNOSIS — Z1231 Encounter for screening mammogram for malignant neoplasm of breast: Secondary | ICD-10-CM

## 2024-04-28 DIAGNOSIS — Z0001 Encounter for general adult medical examination with abnormal findings: Secondary | ICD-10-CM

## 2024-04-28 DIAGNOSIS — E785 Hyperlipidemia, unspecified: Secondary | ICD-10-CM

## 2024-04-28 LAB — COMPREHENSIVE METABOLIC PANEL WITH GFR
ALT: 19 U/L (ref 0–35)
AST: 20 U/L (ref 0–37)
Albumin: 4.4 g/dL (ref 3.5–5.2)
Alkaline Phosphatase: 64 U/L (ref 39–117)
BUN: 17 mg/dL (ref 6–23)
CO2: 27 meq/L (ref 19–32)
Calcium: 9.1 mg/dL (ref 8.4–10.5)
Chloride: 102 meq/L (ref 96–112)
Creatinine, Ser: 0.42 mg/dL (ref 0.40–1.20)
GFR: 108.87 mL/min (ref >60.00)
Glucose, Bld: 126 mg/dL — ABNORMAL HIGH (ref 70–99)
Potassium: 4.2 meq/L (ref 3.5–5.1)
Sodium: 137 meq/L (ref 135–145)
Total Bilirubin: 0.7 mg/dL (ref 0.2–1.2)
Total Protein: 7.8 g/dL (ref 6.0–8.3)

## 2024-04-28 LAB — LIPID PANEL
Cholesterol: 229 mg/dL — ABNORMAL HIGH (ref 0–200)
HDL: 42.1 mg/dL (ref >39.00)
LDL Cholesterol: 142 mg/dL — ABNORMAL HIGH (ref 0–99)
NonHDL: 186.58
Total CHOL/HDL Ratio: 5
Triglycerides: 222 mg/dL — ABNORMAL HIGH (ref 0.0–149.0)
VLDL: 44.4 mg/dL — ABNORMAL HIGH (ref 0.0–40.0)

## 2024-04-28 LAB — CBC WITH DIFFERENTIAL/PLATELET
Basophils Absolute: 0.1 K/uL (ref 0.0–0.1)
Basophils Relative: 0.6 % (ref 0.0–3.0)
Eosinophils Absolute: 0.1 K/uL (ref 0.0–0.7)
Eosinophils Relative: 0.9 % (ref 0.0–5.0)
HCT: 42.2 % (ref 36.0–46.0)
Hemoglobin: 13.8 g/dL (ref 12.0–15.0)
Lymphocytes Relative: 30.9 % (ref 12.0–46.0)
Lymphs Abs: 2.6 K/uL (ref 0.7–4.0)
MCHC: 32.6 g/dL (ref 30.0–36.0)
MCV: 90 fl (ref 78.0–100.0)
Monocytes Absolute: 0.5 K/uL (ref 0.1–1.0)
Monocytes Relative: 5.6 % (ref 3.0–12.0)
Neutro Abs: 5.2 K/uL (ref 1.4–7.7)
Neutrophils Relative %: 62 % (ref 43.0–77.0)
Platelets: 413 K/uL — ABNORMAL HIGH (ref 150.0–400.0)
RBC: 4.69 Mil/uL (ref 3.87–5.11)
RDW: 13.9 % (ref 11.5–15.5)
WBC: 8.4 K/uL (ref 4.0–10.5)

## 2024-04-28 LAB — MICROALBUMIN / CREATININE URINE RATIO
Creatinine,U: 44.3 mg/dL
Microalb Creat Ratio: 22.6 mg/g (ref 0.0–30.0)
Microalb, Ur: 1 mg/dL (ref 0.0–1.9)

## 2024-04-28 LAB — VITAMIN B12: Vitamin B-12: 333 pg/mL (ref 211–911)

## 2024-04-28 LAB — POCT GLYCOSYLATED HEMOGLOBIN (HGB A1C): Hemoglobin A1C: 7.2 % — AB (ref 4.0–5.6)

## 2024-04-28 NOTE — Assessment & Plan Note (Signed)
 Hemoglobin A1c at 7.2 Continues Toujeo , Trulicity , metformin  Sees endocrinologist on a regular basis Continues rosuvastatin  10 mg daily Diet and nutrition discussed Vascular risks associated with diabetes and dyslipidemia discussed The 10-year ASCVD risk score (Arnett DK, et al., 2019) is: 9%   Values used to calculate the score:     Age: 58 years     Clincally relevant sex: Female     Is Non-Hispanic African American: No     Diabetic: Yes     Tobacco smoker: No     Systolic Blood Pressure: 118 mmHg     Is BP treated: Yes     HDL Cholesterol: 39 mg/dL     Total Cholesterol: 262 mg/dL

## 2024-04-28 NOTE — Assessment & Plan Note (Addendum)
 Intermittent symptoms for the last year History of esophageal reflux Affecting quality of life Needs GI evaluation and possible upper endoscopy Referral placed today

## 2024-04-28 NOTE — Patient Instructions (Signed)

## 2024-04-28 NOTE — Assessment & Plan Note (Addendum)
 BP Readings from Last 3 Encounters:  04/28/24 118/74  10/07/23 110/72  03/05/22 126/76  Well-controlled hypertension off medication HemoGlobin A1c is 7.2 still shy of goal Needs to continue follow-up with endocrinologist Presently on insulin  glargine 60 units daily, Trulicity  1.5 mg weekly and metformin  1000 mg twice a day Cardiovascular risks associated with hypertension and diabetes discussed Diet and nutrition discussed Recommend follow-up in 6 months

## 2024-04-28 NOTE — Progress Notes (Signed)
 Nicole Villanueva 58 y.o.   Chief Complaint  Patient presents with   Annual Exam    Patient here for physical. No other concerns.    HISTORY OF PRESENT ILLNESS: This is a 58 y.o. female here for annual exam Has history of diabetes and hypertension Sees endocrinologist on a regular basis.  Last office visit note June 2025 Has occasional dysphagia for the past year Needs referral for colonoscopy and mammogram No other complaints or medical concerns today.  HPI   Prior to Admission medications   Medication Sig Start Date End Date Taking? Authorizing Provider  blood glucose meter kit and supplies Per insurance preference. Test blood glucose three times daily as directed. Dx E11.65, Z79.4 09/27/18  Yes Melonie Colonel, Mikel HERO, MD  Dulaglutide  (TRULICITY ) 1.5 MG/0.5ML SOPN Inject 1.5 mg into the skin once a week. 11/14/22  Yes Mireille Lacombe, Emil Schanz, MD  insulin  glargine (LANTUS  SOLOSTAR) 100 UNIT/ML Solostar Pen Inject 60 Units into the skin daily. 01/23/21  Yes Clover Feehan, Emil Schanz, MD  Insulin  Pen Needle (CAREFINE PEN NEEDLES) 32G X 4 MM MISC Use 4x a day 09/05/20  Yes Mahin Guardia Jose, MD  Lancets (ONETOUCH DELICA PLUS [place]) MISC USE   TO CHECK GLUCOSE THREE TIMES DAILY 09/05/20  Yes Castella Lerner Jose, MD  metFORMIN  (GLUCOPHAGE ) 1000 MG tablet TAKE 1 TABLET BY MOUTH TWICE DAILY WITH A MEAL 05/08/20  Yes Melonie Colonel, Mikel HERO, MD  [facility] VERIO test strip USE  STRIP TO CHECK GLUCOSE THREE TIMES DAILY 01/23/21  Yes Dyllen Menning Jose, MD  rosuvastatin  (CRESTOR ) 10 MG tablet Take 1 tablet (10 mg total) by mouth daily. 08/02/20  Yes Kortney Schoenfelder, Emil Schanz, MD  benzonatate  (TESSALON ) 200 MG capsule Take 1 capsule (200 mg total) by mouth 2 (two) times daily as needed for cough. Patient not taking: Reported on 04/28/2024 10/07/23   Purcell Emil Schanz, MD  HYDROcodone  bit-homatropine (HYCODAN) 5-1.5 MG/5ML syrup Take 5 mLs by mouth at bedtime as needed for cough. 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   Facial lesion 03/05/2022   Uncontrolled type 2 diabetes mellitus with hyperglycemia, with long-term current use of insulin  (HCC) 10/09/2016   Diabetic retinopathy (HCC) 06/13/2016   Uterovaginal prolapse 11/03/2014   Prolapse of anterior vaginal wall 11/03/2014   Prolapse of female pelvic organs 06/09/2014   Hyperlipidemia 03/29/2014   Depressive disorder, not elsewhere classified 03/29/2014   Esophageal reflux 03/29/2014   Hypertension associated with diabetes (HCC) 03/12/2012    Past Medical History:  Diagnosis Date   Diabetes mellitus    Hyperlipidemia    Hypertension     Past Surgical History:  Procedure Laterality Date   ABDOMINAL HYSTERECTOMY     CHOLECYSTECTOMY      Social History   Socioeconomic History   Marital status: Single    Spouse name: Not on file   Number of children: 2   Years of education: Not on file   Highest education level: Not on file  Occupational History   Not on file  Tobacco Use   Smoking status: Never   Smokeless tobacco: Never  Vaping Use  Vaping status: Never Used  Substance and Sexual Activity   Alcohol use: No   Drug use: No   Sexual activity: Yes    Birth control/protection: None  Other Topics Concern   Not on file  Social History Narrative   Not on file   Social Drivers of Health   Financial Resource Strain: Not on file  Food Insecurity: No Food Insecurity (10/10/2021)   Received from [facility]   Hunger Vital Sign    Within the past 12 months, you worried that your food would run out before you got the money to buy more.: Never true    Within the past 12 months, the  food you bought just didn't last and you didn't have money to get more.: Never true  Transportation Needs: Not on file  Physical Activity: Not on file  Stress: Not on file  Social Connections: Unknown (12/23/2021)   Received from [facility]   Social Network    Social Network: Not on file  Intimate Partner Violence: Unknown (11/26/2021)   Received from [facility]   HITS    Physically Hurt: Not on file    Insult or Talk Down To: Not on file    Threaten Physical Harm: Not on file    Scream or Curse: Not on file    Family History  Problem Relation Age of Onset   Diabetes Mother    Diabetes Father      Review of Systems  Constitutional: Negative.  Negative for chills and fever.  HENT: Negative.  Negative for congestion and sore throat.   Respiratory: Negative.  Negative for cough and shortness of breath.   Cardiovascular: Negative.  Negative for chest pain and palpitations.  Gastrointestinal:  Negative for abdominal pain, diarrhea, nausea and vomiting.       Dysphagia  Genitourinary: Negative.  Negative for dysuria and hematuria.  Skin: Negative.  Negative for rash.  Neurological: Negative.  Negative for dizziness and headaches.  All other systems reviewed and are negative.   Vitals:   04/28/24 0839  BP: 118/74  Pulse: 63  Temp: 98.2 F (36.8 C)  SpO2: 98%    Physical Exam Vitals reviewed.  Constitutional:      Appearance: Normal appearance.  HENT:     Head: Normocephalic.     Right Ear: Tympanic membrane, ear canal and external ear normal.     Left Ear: Tympanic membrane, ear canal and external ear normal.     Mouth/Throat:     Mouth: Mucous membranes are moist.     Pharynx: Oropharynx is clear.  Eyes:     Extraocular Movements: Extraocular movements intact.     Pupils: Pupils are equal, round, and reactive to light.  Cardiovascular:     Rate and Rhythm: Normal rate and regular rhythm.     Pulses: Normal pulses.     Heart sounds: Normal heart sounds.   Pulmonary:     Effort: Pulmonary effort is normal.     Breath sounds: Normal breath sounds.  Abdominal:     Palpations: Abdomen is soft.     Tenderness: There is no abdominal tenderness.  Musculoskeletal:     Cervical back: No tenderness.  Lymphadenopathy:     Cervical: No cervical adenopathy.  Skin:    General: Skin is warm and dry.     Capillary Refill: Capillary refill takes less than 2 seconds.  Neurological:     General: No focal deficit present.     Mental Status: She is  alert and oriented to person, place, and time.  Psychiatric:        Mood and Affect: Mood normal.        Behavior: Behavior normal.    Results for orders placed or performed in visit on 04/28/24 (from the past 24 hours)  POCT HgB A1C     Status: Abnormal   Collection Time: 04/28/24  8:54 AM  Result Value Ref Range   Hemoglobin A1C 7.2 (A) 4.0 - 5.6 %   HbA1c POC (<> result, manual entry)     HbA1c, POC (prediabetic range)     HbA1c, POC (controlled diabetic range)       ASSESSMENT & PLAN: Problem List Items Addressed This Visit       Cardiovascular and Mediastinum   Hypertension associated with diabetes (HCC)   BP Readings from Last 3 Encounters:  04/28/24 118/74  10/07/23 110/72  03/05/22 126/76  Well-controlled hypertension off medication HemoGlobin A1c is 7.2 still shy of goal Needs to continue follow-up with endocrinologist Presently on insulin  glargine 60 units daily, Trulicity  1.5 mg weekly and metformin  1000 mg twice a day Cardiovascular risks associated with hypertension and diabetes discussed Diet and nutrition discussed Recommend follow-up in 6 months       Relevant Orders   POCT HgB A1C (Completed)   CBC with Differential/Platelet   Comprehensive metabolic panel with GFR   Lipid panel   Vitamin B12   Microalbumin / creatinine urine ratio     Digestive   Esophageal dysphagia   Intermittent symptoms for the last year History of esophageal reflux Affecting quality of  life Needs GI evaluation and possible upper endoscopy Referral placed today      Relevant Orders   Ambulatory referral to Gastroenterology     Endocrine   Dyslipidemia associated with type 2 diabetes mellitus (HCC)   Hemoglobin A1c at 7.2 Continues Toujeo , Trulicity , metformin  Sees endocrinologist on a regular basis Continues rosuvastatin  10 mg daily Diet and nutrition discussed Vascular risks associated with diabetes and dyslipidemia discussed The 10-year ASCVD risk score (Arnett DK, et al., 2019) is: 9%   Values used to calculate the score:     Age: 32 years     Clincally relevant sex: Female     Is Non-Hispanic African American: No     Diabetic: Yes     Tobacco smoker: No     Systolic Blood Pressure: 118 mmHg     Is BP treated: Yes     HDL Cholesterol: 39 mg/dL     Total Cholesterol: 262 mg/dL       Relevant Orders   Lipid panel   Microalbumin / creatinine urine ratio   Other Visit Diagnoses       Encounter for general adult medical examination with abnormal findings    -  Primary   Relevant Orders   POCT HgB A1C (Completed)   CBC with Differential/Platelet   Comprehensive metabolic panel with GFR   Lipid panel   Vitamin B12   Microalbumin / creatinine urine ratio   MM Digital Screening   Ambulatory referral to Gastroenterology     Visit for screening mammogram       Relevant Orders   MM Digital Screening     Colon cancer screening       Relevant Orders   Ambulatory referral to Gastroenterology     Screening for deficiency anemia       Relevant Orders   CBC with Differential/Platelet     Screening for  endocrine, metabolic and immunity disorder       Relevant Orders   Comprehensive metabolic panel with GFR   Vitamin B12      Modifiable risk factors discussed with patient. Anticipatory guidance according to age provided. The following topics were also discussed: Social Determinants of Health Smoking.  Non-smoker Diet and nutrition Benefits of  exercise Cancer screening and need for colon and breast cancer screening with colonoscopy and mammogram Vaccinations review and recommendations Cardiovascular risk assessment and need for blood work The 10-year ASCVD risk score (Arnett DK, et al., 2019) is: 9%   Values used to calculate the score:     Age: 23 years     Clincally relevant sex: Female     Is Non-Hispanic African American: No     Diabetic: Yes     Tobacco smoker: No     Systolic Blood Pressure: 118 mmHg     Is BP treated: Yes     HDL Cholesterol: 39 mg/dL     Total Cholesterol: 262 mg/dL Review of multiple chronic medical conditions and their management Review of all medications Mental health including depression and anxiety Fall and accident prevention  Patient Instructions  Health Maintenance, Female Adopting a healthy lifestyle and getting preventive care are important in promoting health and wellness. Ask your health care provider about: The right schedule for you to have regular tests and exams. Things you can do on your own to prevent diseases and keep yourself healthy. What should I know about diet, weight, and exercise? Eat a healthy diet  Eat a diet that includes plenty of vegetables, fruits, low-fat dairy products, and lean protein. Do not eat a lot of foods that are high in solid fats, added sugars, or sodium. Maintain a healthy weight Body mass index (BMI) is used to identify weight problems. It estimates body fat based on height and weight. Your health care provider can help determine your BMI and help you achieve or maintain a healthy weight. Get regular exercise Get regular exercise. This is one of the most important things you can do for your health. Most adults should: Exercise for at least 150 minutes each week. The exercise should increase your heart rate and make you sweat (moderate-intensity exercise). Do strengthening exercises at least twice a week. This is in addition to the moderate-intensity  exercise. Spend less time sitting. Even light physical activity can be beneficial. Watch cholesterol and blood lipids Have your blood tested for lipids and cholesterol at 57 years of age, then have this test every 5 years. Have your cholesterol levels checked more often if: Your lipid or cholesterol levels are high. You are older than 57 years of age. You are at high risk for heart disease. What should I know about cancer screening? Depending on your health history and family history, you may need to have cancer screening at various ages. This may include screening for: Breast cancer. Cervical cancer. Colorectal cancer. Skin cancer. Lung cancer. What should I know about heart disease, diabetes, and high blood pressure? Blood pressure and heart disease High blood pressure causes heart disease and increases the risk of stroke. This is more likely to develop in people who have high blood pressure readings or are overweight. Have your blood pressure checked: Every 3-5 years if you are 84-81 years of age. Every year if you are 93 years old or older. Diabetes Have regular diabetes screenings. This checks your fasting blood sugar level. Have the screening done: Once every three years after  age 70 if you are at a normal weight and have a low risk for diabetes. More often and at a younger age if you are overweight or have a high risk for diabetes. What should I know about preventing infection? Hepatitis B If you have a higher risk for hepatitis B, you should be screened for this virus. Talk with your health care provider to find out if you are at risk for hepatitis B infection. Hepatitis C Testing is recommended for: Everyone born from [birth year] through 1964. Anyone with known risk factors for hepatitis C. Sexually transmitted infections (STIs) Get screened for STIs, including gonorrhea and chlamydia, if: You are sexually active and are younger than 57 years of age. You are older than 57 years  of age and your health care provider tells you that you are at risk for this type of infection. Your sexual activity has changed since you were last screened, and you are at increased risk for chlamydia or gonorrhea. Ask your health care provider if you are at risk. Ask your health care provider about whether you are at high risk for HIV. Your health care provider may recommend a prescription medicine to help prevent HIV infection. If you choose to take medicine to prevent HIV, you should first get tested for HIV. You should then be tested every 3 months for as long as you are taking the medicine. Pregnancy If you are about to stop having your period (premenopausal) and you may become pregnant, seek counseling before you get pregnant. Take 400 to 800 micrograms (mcg) of folic acid every day if you become pregnant. Ask for birth control (contraception) if you want to prevent pregnancy. Osteoporosis and menopause Osteoporosis is a disease in which the bones lose minerals and strength with aging. This can result in bone fractures. If you are 38 years old or older, or if you are at risk for osteoporosis and fractures, ask your health care provider if you should: Be screened for bone loss. Take a calcium  or vitamin D supplement to lower your risk of fractures. Be given hormone replacement therapy (HRT) to treat symptoms of menopause. Follow these instructions at home: Alcohol use Do not drink alcohol if: Your health care provider tells you not to drink. You are pregnant, may be pregnant, or are planning to become pregnant. If you drink alcohol: Limit how much you have to: 0-1 drink a day. Know how much alcohol is in your drink. In the [place] one drink equals one 12 oz bottle of beer (355 mL), one 5 oz glass of wine (148 mL), or one 1 oz glass of hard liquor (44 mL). Lifestyle Do not use any products that contain nicotine or tobacco. These products include cigarettes, chewing tobacco, and vaping  devices, such as e-cigarettes. If you need help quitting, ask your health care provider. Do not use street drugs. Do not share needles. Ask your health care provider for help if you need support or information about quitting drugs. General instructions Schedule regular health, dental, and eye exams. Stay current with your vaccines. Tell your health care provider if: You often feel depressed. You have ever been abused or do not feel safe at home. Summary Adopting a healthy lifestyle and getting preventive care are important in promoting health and wellness. Follow your health care provider's instructions about healthy diet, exercising, and getting tested or screened for diseases. Follow your health care provider's instructions on monitoring your cholesterol and blood pressure. This information is not intended to replace  advice given to you by your health care provider. Make sure you discuss any questions you have with your health care provider. Document Revised: 12/31/2020 Document Reviewed: 12/31/2020 Elsevier Patient Education  2024 Elsevier Inc.       Emil Schaumann, MD [facility]

## 2024-04-29 ENCOUNTER — Telehealth: Payer: Self-pay

## 2024-04-29 NOTE — Progress Notes (Signed)
 Care Guide Pharmacy Note  04/29/2024 Name: Nicole Villanueva MRN: [id number] DOB: 10-Sep-1965  Referred By: Purcell Emil Schanz, MD Reason for referral: Complex Care Management (Outreach to schedule with Pharm d )   Nicole Villanueva is a 58 y.o. year old female who is a primary care patient of Sagardia, Miguel Jose, MD.  Nicole Villanueva was referred to the pharmacist for assistance related to: HLD and DMII  Successful contact was made with the patient to discuss pharmacy services including being ready for the pharmacist to call at least 5 minutes before the scheduled appointment time and to have medication bottles and any blood pressure readings ready for review. The patient agreed to meet with the pharmacist via telephone visit on (date/time).05/12/2024  Jeoffrey Buffalo , RMA     [place]  [facility], [facility] Guide  Direct Dial : [phone]  Website: [email or web address]

## 2024-05-04 ENCOUNTER — Ambulatory Visit

## 2024-05-10 ENCOUNTER — Ambulatory Visit
Admission: RE | Admit: 2024-05-10 | Discharge: 2024-05-10 | Disposition: A | Discharge status: Home / Self Care | Source: Ambulatory Visit | Attending: Emergency Medicine

## 2024-05-10 DIAGNOSIS — Z1231 Encounter for screening mammogram for malignant neoplasm of breast: Secondary | ICD-10-CM

## 2024-05-10 DIAGNOSIS — Z0001 Encounter for general adult medical examination with abnormal findings: Secondary | ICD-10-CM

## 2024-05-12 ENCOUNTER — Other Ambulatory Visit (INDEPENDENT_AMBULATORY_CARE_PROVIDER_SITE_OTHER): Admitting: Pharmacist

## 2024-05-12 DIAGNOSIS — E1169 Type 2 diabetes mellitus with other specified complication: Secondary | ICD-10-CM

## 2024-05-12 DIAGNOSIS — E785 Hyperlipidemia, unspecified: Secondary | ICD-10-CM

## 2024-05-12 MED ORDER — ROSUVASTATIN CALCIUM 20 MG PO TABS
20.0000 mg | ORAL_TABLET | Freq: Every day | ORAL | 0 refills | Status: AC
Start: 2024-05-12 — End: ?

## 2024-05-12 NOTE — Patient Instructions (Signed)
 It was a pleasure speaking with you today!  Start rosuvastatin  20 mg 1 tablet every day.    I will call you on October 30th for follow up.  Feel free to call with any questions or concerns!  Darrelyn Drum, PharmD, BCPS, CPP Clinical Pharmacist Practitioner  Primary Care at [facility] [phone]

## 2024-05-12 NOTE — Progress Notes (Signed)
 05/12/2024 Name: Nicole Villanueva MRN: [id number] DOB: [birth year]  Chief Complaint  Patient presents with   Hyperlipidemia   Medication Management    Nicole Villanueva is a 58 y.o. year old female who presented for a telephone visit.   They were referred to the pharmacist by their PCP for assistance in managing hyperlipidemia/cardiovascular risk reduction.   Subjective:  Care Team: Primary Care Provider: Purcell Emil Schanz, MD ; Next Scheduled Visit: 10/26/24 Sees endocrinologist, Dr. Beryl, last saw them in June per patient  Medication Access/Adherence  Current Pharmacy:  [facility] [address] [place] - [address] [place] [place] [address] Phone: [phone] Fax: [phone]   Patient reports affordability concerns with their medications: No  Patient reports access/transportation concerns to their pharmacy: No  Patient reports adherence concerns with their medications:  Yes     Hyperlipidemia/ASCVD Risk Reduction  Current lipid lowering medications: none Medications tried in the past: rosuvastatin , atorvastatin , Livalo , pravastatin   Last filled was rosuvastatin  40 mg. Pt notes she has not been taking because she forgot to ask for refills. She notes she felt bad for about a week after starting but then tolerated it okay. She also notes that when she used to take the statin, it did not lower cholesterol.    PREVENT Risk Score:  OMesothelioma.fr - 10 year risk of CVD: 7.9% - 10 year risk of ASCVD: 5.1% - 10 year risk of HF: 3.6%   Objective:  Lab Results  Component Value Date   HGBA1C 7.2 (A) 04/28/2024    Lab Results  Component Value Date   CREATININE 0.42 04/28/2024   BUN 17 04/28/2024   NA 137 04/28/2024   K 4.2 04/28/2024   CL 102 04/28/2024   CO2 27 04/28/2024    Lab Results  Component Value Date   CHOL 229 (H)  04/28/2024   HDL 42.10 04/28/2024   LDLCALC 142 (H) 04/28/2024   LDLDIRECT 147.0 09/22/2017   TRIG 222.0 (H) 04/28/2024   CHOLHDL 5 04/28/2024    Medications Reviewed Today     Reviewed by Merceda Lela SAUNDERS, RPH (Pharmacist) on 05/12/24 at 1005  Med List Status: <None>   Medication Order Taking? Sig Documenting Provider Last Dose Status Informant   Patient not taking:   Discontinued 05/12/24 0951   blood glucose meter kit and supplies [id number]  Per insurance preference. Test blood glucose three times daily as directed. Dx E11.65, Z79.4 Nicole Villanueva, Mikel HERO, MD  Active            Med Note Villanueva, Nicole A   Wed Sep 05, 2020  4:03 PM) use  Dulaglutide  (TRULICITY ) 1.5 MG/0.5ML SOPN [id number] Yes Inject 1.5 mg into the skin once a week. Sagardia, Miguel Jose, MD  Active    Patient not taking:   Discontinued 05/12/24 9047     Discontinued 05/12/24 1004   insulin  glargine, 2 Unit Dial , (TOUJEO  MAX SOLOSTAR) 300 UNIT/ML Solostar Pen [id number] Yes Inject 32 Units into the skin daily at 6 (six) AM. [provider]  Active   Insulin  Pen Needle (CAREFINE PEN NEEDLES) 32G X 4 MM MISC [id number]  Use 4x a day Sagardia, Miguel Jose, MD  Active   Lancets [facility] CAT[place] PLUS [place]) [place] [id number]  USE   TO CHECK GLUCOSE THREE TIMES DAILY Purcell Emil Schanz, MD  Active    Patient not taking:   Discontinued 05/12/24 1005    Patient not taking:   Discontinued 05/12/24 0951  ONETOUCH VERIO test strip [id number]  USE  STRIP TO CHECK GLUCOSE THREE TIMES DAILY Sagardia, Emil Schanz, MD  Active   rosuvastatin  (CRESTOR ) 10 MG tablet [id number]  Take 1 tablet (10 mg total) by mouth daily.  Patient not taking: Reported on 05/12/2024   Purcell Emil Schanz, MD  Active   [facility] 12.12-998 MG TABS [id number] Yes Take 1 tablet by mouth 2 (two) times daily. [provider]  Active               Assessment/Plan:   Hyperlipidemia/ASCVD Risk Reduction: - Currently uncontrolled.  LDL goal <70 due to T2DM - Reviewed long term complications of uncontrolled cholesterol - Reviewed dietary recommendations including increasing fiber in diet - Recommend to start rosuvastatin  40 mg daily  - Will f/u in 6 weeks and check lipid panel   Follow Up Plan: 10/30  Darrelyn Drum, PharmD, BCPS, CPP Clinical Pharmacist Practitioner [place] [facility] [phone]

## 2024-06-23 ENCOUNTER — Other Ambulatory Visit: Admitting: Pharmacist

## 2024-06-23 DIAGNOSIS — E1165 Type 2 diabetes mellitus with hyperglycemia: Secondary | ICD-10-CM

## 2024-06-23 MED ORDER — TOUJEO MAX SOLOSTAR 300 UNIT/ML ~~LOC~~ SOPN: 32.0000 [IU] | PEN_INJECTOR | Freq: Every day | SUBCUTANEOUS | 0 refills | Status: AC

## 2024-06-23 MED ORDER — TRULICITY 4.5 MG/0.5ML ~~LOC~~ SOAJ: 4.5000 mg | SUBCUTANEOUS | 0 refills | Status: AC

## 2024-06-23 MED ORDER — SYNJARDY 12.5-1000 MG PO TABS: 1.0000 | ORAL_TABLET | Freq: Two times a day (BID) | ORAL | 0 refills | Status: AC

## 2024-06-23 NOTE — Patient Instructions (Signed)
 It was a pleasure speaking with you today!  Continue current regimen. Come next week for walk in fasting labs.  Feel free to call with any questions or concerns!  Darrelyn Drum, PharmD, BCPS, CPP Clinical Pharmacist Practitioner [place] [facility] [phone]

## 2024-06-23 NOTE — Progress Notes (Signed)
 06/23/2024 Name: Nicole Villanueva MRN: [id number] DOB: 04-08-1966  Chief Complaint  Patient presents with   Hyperlipidemia   Medication Management    Tristan Bramble is a 58 y.o. year old female who presented for a telephone visit.   They were referred to the pharmacist by their PCP for assistance in managing hyperlipidemia/cardiovascular risk reduction.    Subjective:  Care Team: Primary Care Provider: Purcell Emil Schanz, MD ; Next Scheduled Visit: 10/26/24 Sees endocrinologist, Dr. Beryl, last saw them in June per patient  Medication Access/Adherence  Current Pharmacy:  [facility] [address], [place] - [address] [place] [address] [address] Phone: [phone] Fax: [phone]   Patient reports affordability concerns with their medications: No  Patient reports access/transportation concerns to their pharmacy: No  Patient reports adherence concerns with their medications:  Yes     *Pt notes she is out of her diabetic meds and is not able to get an appt with her endocrinologist until January. Asked if we could give her an Rx for diabetic meds to carry her over to her upcoming appt  Hyperlipidemia/ASCVD Risk Reduction  Current lipid lowering medications: rosuvastatin  20 mg daily Medications tried in the past: rosuvastatin , atorvastatin , Livalo , pravastatin   Pt was previously not taking a statin when last lipid panel was checked  Pt confirms she has been taking rosuvastatin  daily without issue since last telephone call  PREVENT Risk Score:  omesothelioma.fr - 10 year risk of CVD: 7.9% - 10 year risk of ASCVD: 5.1% - 10 year risk of HF: 3.6%   Objective:  Lab Results  Component Value Date   HGBA1C 7.2 (A) 04/28/2024    Lab Results  Component Value Date   CREATININE 0.42 04/28/2024   BUN 17 04/28/2024   NA 137 04/28/2024   K  4.2 04/28/2024   CL 102 04/28/2024   CO2 27 04/28/2024    Lab Results  Component Value Date   CHOL 229 (H) 04/28/2024   HDL 42.10 04/28/2024   LDLCALC 142 (H) 04/28/2024   LDLDIRECT 147.0 09/22/2017   TRIG 222.0 (H) 04/28/2024   CHOLHDL 5 04/28/2024    Medications Reviewed Today     Reviewed by Merceda Lela SAUNDERS, RPH (Pharmacist) on 06/23/24 at 1352  Med List Status: <None>   Medication Order Taking? Sig Documenting Provider Last Dose Status Informant  blood glucose meter kit and supplies [id number]  Per insurance preference. Test blood glucose three times daily as directed. Dx E11.65, Z79.4 Melonie Colonel, Mikel HERO, MD  Active            Med Note JULES, CYNTHIA A   Wed Sep 05, 2020  4:03 PM) use  Patient taking differently:   Discontinued 06/23/24 0959 (Dose change)   Dulaglutide  (TRULICITY ) 4.5 MG/0.5ML SOAJ [id number] Yes Inject 4.5 mg as directed once a week. Sagardia, Miguel Jose, MD  Active   insulin  glargine, 2 Unit Dial , (TOUJEO  MAX SOLOSTAR) 300 UNIT/ML Solostar Pen [id number] Yes Inject 32 Units into the skin daily. Sagardia, Miguel Jose, MD  Active   Insulin  Pen Needle (CAREFINE PEN NEEDLES) 32G X 4 MM MISC [id number]  Use 4x a day Sagardia, Miguel Jose, MD  Active   Lancets [facility] CATHRYNE PLUS [place]) [place] [id number]  USE   TO [facility] THREE TIMES DAILY Purcell Emil Schanz, MD  Active   [facility] VERIO test strip [id number]  USE  STRIP TO CHECK GLUCOSE THREE TIMES DAILY Sagardia, Emil Schanz, MD  Active  rosuvastatin  (CRESTOR ) 20 MG tablet [id number] Yes Take 1 tablet (20 mg total) by mouth daily. Purcell Emil Schanz, MD  Active   [facility] 12.12-998 MG TABS [id number]  Take 1 tablet by mouth 2 (two) times daily. Purcell Emil Schanz, MD  Active               Assessment/Plan:   Hyperlipidemia/ASCVD Risk Reduction: - Currently uncontrolled. LDL goal <70 due to T2DM - Reviewed long term complications of uncontrolled cholesterol - Reviewed dietary  recommendations including increasing fiber in diet - Recommend to continue rosuvastatin  20 mg daily    Confirmed diabetes meds and sent 90 DS with no refills. She has appt scheduled with Endo on 09/15/24 per chart.  Follow Up Plan: Lipid panel next week, walk in lab  Darrelyn Drum, PharmD, BCPS, CPP Clinical Pharmacist Practitioner [facility] [phone]

## 2024-06-29 ENCOUNTER — Other Ambulatory Visit

## 2024-06-29 ENCOUNTER — Ambulatory Visit: Payer: Self-pay | Admitting: Pharmacist

## 2024-06-29 DIAGNOSIS — E1165 Type 2 diabetes mellitus with hyperglycemia: Secondary | ICD-10-CM

## 2024-06-29 DIAGNOSIS — Z794 Long term (current) use of insulin: Secondary | ICD-10-CM

## 2024-06-29 DIAGNOSIS — E1169 Type 2 diabetes mellitus with other specified complication: Secondary | ICD-10-CM

## 2024-06-29 LAB — LIPID PANEL
Cholesterol: 118 mg/dL (ref 0–200)
HDL: 45.8 mg/dL (ref >39.00)
LDL Cholesterol: 38 mg/dL (ref 0–99)
NonHDL: 72.46
Total CHOL/HDL Ratio: 3
Triglycerides: 174 mg/dL — ABNORMAL HIGH (ref 0.0–149.0)
VLDL: 34.8 mg/dL (ref 0.0–40.0)

## 2024-06-29 MED ORDER — FREESTYLE LIBRE 3 PLUS SENSOR MISC: 0 refills | Status: AC

## 2024-06-29 NOTE — Progress Notes (Signed)
 06/29/2024 Name: Maxi Carreras MRN: [id number] DOB: April 22, 1966  Chief Complaint  Patient presents with   Hyperlipidemia   Medication Management    Keiko Myricks is a 58 y.o. year old female who presented for a telephone visit.   They were referred to the pharmacist by their PCP for assistance in managing hyperlipidemia/cardiovascular risk reduction.    Subjective:  Care Team: Primary Care Provider: Purcell Emil Schanz, MD ; Next Scheduled Visit: 10/26/24 Sees endocrinologist, Dr. Beryl, last saw them in June per patient  Medication Access/Adherence  Current Pharmacy:  [facility] [address] [place] - [address] [place] [address] Phone: [phone] Fax: [phone]   Patient reports affordability concerns with their medications: No  Patient reports access/transportation concerns to their pharmacy: No  Patient reports adherence concerns with their medications:  Yes     *Pt notes she is out of her Herlene 3 sensors and is not able to get an appt with her endocrinologist until January. Asked if we could give her an Rx to carry her over to her upcoming appt  Hyperlipidemia/ASCVD Risk Reduction  Current lipid lowering medications: rosuvastatin  20 mg daily Medications tried in the past: rosuvastatin , atorvastatin , Livalo , pravastatin   Pt was previously not taking a statin when last lipid panel was checked  Pt confirms she has been taking rosuvastatin  daily without issue since last telephone call  PREVENT Risk Score:  omesothelioma.fr - 10 year risk of CVD: 7.9% - 10 year risk of ASCVD: 5.1% - 10 year risk of HF: 3.6%   Objective:  Lab Results  Component Value Date   HGBA1C 7.2 (A) 04/28/2024    Lab Results  Component Value Date   CREATININE 0.42 04/28/2024   BUN 17 04/28/2024   NA 137 04/28/2024   K 4.2 04/28/2024    CL 102 04/28/2024   CO2 27 04/28/2024    Lab Results  Component Value Date   CHOL 118 06/29/2024   HDL 45.80 06/29/2024   LDLCALC 38 06/29/2024   LDLDIRECT 147.0 09/22/2017   TRIG 174.0 (H) 06/29/2024   CHOLHDL 3 06/29/2024    Medications Reviewed Today     Reviewed by Merceda Lela SAUNDERS, RPH (Pharmacist) on 06/29/24 at 1122  Med List Status: <None>   Medication Order Taking? Sig Documenting Provider Last Dose Status Informant  blood glucose meter kit and supplies [id number] Yes Per insurance preference. Test blood glucose three times daily as directed. Dx E11.65, Z79.4 Melonie Colonel, Mikel HERO, MD  Active            Med Note JULES, CYNTHIA A   Wed Sep 05, 2020  4:03 PM) use  Dulaglutide  (TRULICITY ) 4.5 MG/0.5ML EMMANUEL [id number] Yes Inject 4.5 mg as directed once a week. Sagardia, Miguel Jose, MD  Active   insulin  glargine, 2 Unit Dial , (TOUJEO  MAX SOLOSTAR) 300 UNIT/ML Solostar Pen [id number] Yes Inject 32 Units into the skin daily. Sagardia, Miguel Jose, MD  Active   Insulin  Pen Needle (CAREFINE PEN NEEDLES) 32G X 4 MM MISC [id number] Yes Use 4x a day Sagardia, Miguel Jose, MD  Active   Lancets [facility] CATHRYNE PLUS [place]) [place] [id number] Yes USE   TO [facility] GLUCOSE THREE TIMES DAILY Purcell Emil Schanz, MD  Active   [facility] VERIO test strip [id number] Yes USE  STRIP TO CHECK GLUCOSE THREE TIMES DAILY Purcell Emil Schanz, MD  Active   rosuvastatin  (CRESTOR ) 20 MG tablet [id number] Yes Take 1 tablet (20 mg total) by mouth  daily. Purcell Emil Schanz, MD  Active   [facility] 12.12-998 MG TABS [id number] Yes Take 1 tablet by mouth 2 (two) times daily. Purcell Emil Schanz, MD  Active               Assessment/Plan:   Hyperlipidemia/ASCVD Risk Reduction: - Currently controlled. LDL goal <70 due to T2DM. Since pt is now taking rosuvastatin  daily as prescribed, LDL is now at goal.  - Reviewed long term complications of uncontrolled cholesterol - Reviewed dietary recommendations  including increasing fiber in diet - Recommend to continue rosuvastatin  20 mg daily. May consider reducing atorvastatin  to 10 mg if LDL decreases further on next lipid panel.    - Sending Libre 3 plus sensors  Follow Up Plan:  PCP f/u in March  Darrelyn Drum, PharmD, BCPS, CPP Clinical Pharmacist Practitioner [facility] [phone]

## 2024-10-26 ENCOUNTER — Ambulatory Visit: Admitting: Emergency Medicine
